# Patient Record
Sex: Male | Born: 1935 | Race: White | Hispanic: No | Marital: Married | Smoking: Former smoker
Health system: Southern US, Community
[De-identification: ages and names within clinical notes are randomized; demographics above are authoritative.]

## PROBLEM LIST (undated history)

## (undated) DIAGNOSIS — M1990 Unspecified osteoarthritis, unspecified site: Secondary | ICD-10-CM

## (undated) DIAGNOSIS — I519 Heart disease, unspecified: Secondary | ICD-10-CM

## (undated) DIAGNOSIS — H409 Unspecified glaucoma: Secondary | ICD-10-CM

## (undated) DIAGNOSIS — M109 Gout, unspecified: Secondary | ICD-10-CM

## (undated) DIAGNOSIS — I4891 Unspecified atrial fibrillation: Secondary | ICD-10-CM

## (undated) DIAGNOSIS — K219 Gastro-esophageal reflux disease without esophagitis: Secondary | ICD-10-CM

## (undated) DIAGNOSIS — E785 Hyperlipidemia, unspecified: Secondary | ICD-10-CM

## (undated) HISTORY — DX: Unspecified atrial fibrillation: I48.91

## (undated) HISTORY — DX: Gastro-esophageal reflux disease without esophagitis: K21.9

## (undated) HISTORY — PX: GLAUCOMA SURGERY: SHX656

## (undated) HISTORY — DX: Gout, unspecified: M10.9

## (undated) HISTORY — DX: Unspecified glaucoma: H40.9

## (undated) HISTORY — DX: Unspecified osteoarthritis, unspecified site: M19.90

## (undated) HISTORY — PX: COLONOSCOPY: SHX5424

## (undated) HISTORY — DX: Hyperlipidemia, unspecified: E78.5

## (undated) HISTORY — DX: Heart disease, unspecified: I51.9

---

## 2004-11-16 ENCOUNTER — Ambulatory Visit: Payer: Self-pay | Admitting: General Practice

## 2005-01-04 ENCOUNTER — Ambulatory Visit: Payer: Self-pay | Admitting: General Practice

## 2005-01-04 ENCOUNTER — Other Ambulatory Visit: Payer: Self-pay

## 2005-01-11 ENCOUNTER — Ambulatory Visit: Payer: Self-pay | Admitting: General Practice

## 2007-05-24 ENCOUNTER — Ambulatory Visit: Payer: Self-pay | Admitting: Family Medicine

## 2007-06-11 ENCOUNTER — Ambulatory Visit: Payer: Self-pay | Admitting: Surgery

## 2007-06-12 ENCOUNTER — Ambulatory Visit: Payer: Self-pay | Admitting: Surgery

## 2011-10-03 ENCOUNTER — Ambulatory Visit: Payer: Self-pay | Admitting: Family Medicine

## 2011-10-04 ENCOUNTER — Ambulatory Visit: Payer: Self-pay | Admitting: Family Medicine

## 2014-07-29 DIAGNOSIS — Z7901 Long term (current) use of anticoagulants: Secondary | ICD-10-CM | POA: Insufficient documentation

## 2014-07-29 DIAGNOSIS — M109 Gout, unspecified: Secondary | ICD-10-CM | POA: Insufficient documentation

## 2014-07-29 DIAGNOSIS — I4891 Unspecified atrial fibrillation: Secondary | ICD-10-CM | POA: Insufficient documentation

## 2014-07-29 DIAGNOSIS — K219 Gastro-esophageal reflux disease without esophagitis: Secondary | ICD-10-CM | POA: Insufficient documentation

## 2014-07-29 DIAGNOSIS — K5732 Diverticulitis of large intestine without perforation or abscess without bleeding: Secondary | ICD-10-CM | POA: Insufficient documentation

## 2014-07-29 DIAGNOSIS — E7849 Other hyperlipidemia: Secondary | ICD-10-CM | POA: Insufficient documentation

## 2014-07-29 HISTORY — DX: Long term (current) use of anticoagulants: Z79.01

## 2014-08-28 ENCOUNTER — Other Ambulatory Visit: Payer: Medicare Other

## 2014-08-28 DIAGNOSIS — Z7901 Long term (current) use of anticoagulants: Secondary | ICD-10-CM

## 2014-08-29 LAB — PROTIME-INR
INR: 2.4 — AB (ref 0.8–1.2)
Prothrombin Time: 25.3 s — ABNORMAL HIGH (ref 9.1–12.0)

## 2014-09-29 ENCOUNTER — Other Ambulatory Visit: Payer: Medicare Other | Admitting: Family Medicine

## 2014-09-29 DIAGNOSIS — Z7901 Long term (current) use of anticoagulants: Secondary | ICD-10-CM

## 2014-09-30 ENCOUNTER — Telehealth: Payer: Self-pay

## 2014-09-30 LAB — PROTIME-INR
INR: 2.6 — ABNORMAL HIGH (ref 0.8–1.2)
PROTHROMBIN TIME: 26.9 s — AB (ref 9.1–12.0)

## 2014-09-30 NOTE — Telephone Encounter (Signed)
-----   Message from Duanne Limerickeanna C Jones, MD sent at 09/30/2014  7:52 AM EDT ----- Same/4 wks

## 2014-09-30 NOTE — Telephone Encounter (Signed)
Spoke with patient wife, advised of results. [facility]

## 2014-11-03 ENCOUNTER — Other Ambulatory Visit: Payer: Medicare Other

## 2014-11-03 DIAGNOSIS — Z7901 Long term (current) use of anticoagulants: Secondary | ICD-10-CM

## 2014-11-04 LAB — PROTIME-INR
INR: 2.9 — ABNORMAL HIGH (ref 0.8–1.2)
PROTHROMBIN TIME: 29.8 s — AB (ref 9.1–12.0)

## 2014-11-19 ENCOUNTER — Other Ambulatory Visit: Payer: Self-pay

## 2014-11-19 DIAGNOSIS — Z7901 Long term (current) use of anticoagulants: Secondary | ICD-10-CM

## 2014-11-19 MED ORDER — WARFARIN SODIUM 6 MG PO TABS: 6.0000 mg | ORAL_TABLET | Freq: Every day | ORAL | Status: DC

## 2014-11-27 ENCOUNTER — Encounter: Payer: Self-pay | Admitting: Family Medicine

## 2014-11-27 ENCOUNTER — Ambulatory Visit (INDEPENDENT_AMBULATORY_CARE_PROVIDER_SITE_OTHER): Payer: Medicare Other | Admitting: Family Medicine

## 2014-11-27 VITALS — BP 140/80 | HR 60 | Ht 68.0 in | Wt 200.0 lb

## 2014-11-27 DIAGNOSIS — Z7901 Long term (current) use of anticoagulants: Secondary | ICD-10-CM | POA: Diagnosis not present

## 2014-11-27 DIAGNOSIS — Z23 Encounter for immunization: Secondary | ICD-10-CM

## 2014-11-27 DIAGNOSIS — E7849 Other hyperlipidemia: Secondary | ICD-10-CM

## 2014-11-27 DIAGNOSIS — E784 Other hyperlipidemia: Secondary | ICD-10-CM

## 2014-11-27 DIAGNOSIS — K219 Gastro-esophageal reflux disease without esophagitis: Secondary | ICD-10-CM

## 2014-11-27 DIAGNOSIS — I4891 Unspecified atrial fibrillation: Secondary | ICD-10-CM | POA: Diagnosis not present

## 2014-11-27 LAB — HEMOCCULT GUIAC POC 1CARD (OFFICE): FECAL OCCULT BLD: NEGATIVE

## 2014-11-27 MED ORDER — WARFARIN SODIUM 6 MG PO TABS
6.0000 mg | ORAL_TABLET | Freq: Every day | ORAL | Status: DC
Start: 2014-11-27 — End: 2015-01-19

## 2014-11-27 NOTE — Patient Instructions (Signed)

## 2014-11-27 NOTE — Progress Notes (Signed)
Name: Erik Larson   MRN: [id number]    DOB: Mar 11, 1925   Date:11/27/2014       Progress Note  Subjective  Chief Complaint  Chief Complaint  Patient presents with  . Atrial Fibrillation    needs refill on coumadin    Atrial Fibrillation Presents for follow-up visit. Symptoms are negative for an AICD problem, bradycardia, chest pain, dizziness, hemodynamic instability, hypertension, hypotension, pacemaker problem, palpitations, shortness of breath, syncope, tachycardia and weakness. The symptoms have been stable. Past treatments include warfarin. Compliance with prior treatments has been good. Past medical history includes atrial fibrillation and hyperlipidemia. There is no history of atrial flutter, AICD, CABG/stent, CAD, CHF, DVT, HTN and valvular heart disease. There are no medication compliance problems.  Heartburn He complains of heartburn. He reports no abdominal pain, no belching, no chest pain, no choking, no coughing, no dysphagia, no hoarse voice, no nausea, no sore throat or no wheezing. This is a chronic problem. The current episode started more than 1 year ago. The problem occurs frequently. The problem has been unchanged. The heartburn is of mild intensity. The symptoms are aggravated by certain foods. Pertinent negatives include no anemia, fatigue, melena, muscle weakness, orthopnea or weight loss. He has tried an antacid for the symptoms. The treatment provided mild relief.  Hyperlipidemia This is a chronic problem. The current episode started more than 1 year ago. The problem is uncontrolled. Recent lipid tests were reviewed and are variable. He has no history of chronic renal disease, diabetes, hypothyroidism, liver disease, obesity or nephrotic syndrome. There are no known factors aggravating his hyperlipidemia. Pertinent negatives include no chest pain, focal sensory loss, focal weakness, leg pain, myalgias or shortness of breath. Current antihyperlipidemic treatment includes  diet change. The current treatment provides mild improvement of lipids. There are no compliance problems.  Risk factors for coronary artery disease include dyslipidemia, male sex and obesity.    No problem-specific assessment & plan notes found for this encounter.   Past Medical History  Diagnosis Date  . Atrial fibrillation     controlled on coumadin  . GERD (gastroesophageal reflux disease)   . Hyperlipidemia     Past Surgical History  Procedure Laterality Date  . Glaucoma surgery    . Colonoscopy  2005 ?    Family History  Problem Relation Age of Onset  . Cancer Maternal Uncle     Social History   Social History  . Marital Status: Married    Spouse Name: N/A  . Number of Children: N/A  . Years of Education: N/A   Occupational History  . Not on file.   Social History Main Topics  . Smoking status: Never Smoker   . Smokeless tobacco: Not on file  . Alcohol Use: 0.0 oz/week    0 Standard drinks or equivalent per week  . Drug Use: No  . Sexual Activity: Yes   Other Topics Concern  . Not on file   Social History Narrative    Allergies  Allergen Reactions  . Ace Inhibitors   . Penicillins      Review of Systems  Constitutional: Negative for fever, chills, weight loss, malaise/fatigue and fatigue.  HENT: Negative for ear discharge, ear pain, hoarse voice and sore throat.   Eyes: Negative for blurred vision.  Respiratory: Negative for cough, sputum production, choking, shortness of breath and wheezing.   Cardiovascular: Negative for chest pain, palpitations, leg swelling and syncope.  Gastrointestinal: Positive for heartburn. Negative for dysphagia, nausea, abdominal pain,  diarrhea, constipation, blood in stool and melena.  Genitourinary: Negative for dysuria, urgency, frequency and hematuria.  Musculoskeletal: Negative for myalgias, back pain, joint pain, muscle weakness and neck pain.  Skin: Negative for rash.  Neurological: Negative for dizziness,  tingling, sensory change, focal weakness, weakness and headaches.  Endo/Heme/Allergies: Negative for environmental allergies and polydipsia. Does not bruise/bleed easily.  Psychiatric/Behavioral: Negative for depression and suicidal ideas. The patient is not nervous/anxious and does not have insomnia.      Objective  Filed Vitals:   11/27/14 0807  BP: 140/80  Pulse: 60  Height:  (1.727 m)  Weight: 200 lb (90.719 kg)    Physical Exam  Constitutional: He is oriented to person, place, and time and well-developed, well-nourished, and in no distress.  HENT:  Head: Normocephalic.  Right Ear: External ear normal.  Left Ear: External ear normal.  Nose: Nose normal.  Mouth/Throat: Oropharynx is clear and moist.  Eyes: Conjunctivae and EOM are normal. Pupils are equal, round, and reactive to light. Right eye exhibits no discharge. Left eye exhibits no discharge. No scleral icterus.  Neck: Normal range of motion. Neck supple. No JVD present. No tracheal deviation present. No thyromegaly present.  Cardiovascular: Normal rate, regular rhythm, normal heart sounds and intact distal pulses.  Exam reveals no gallop and no friction rub.   No murmur heard. Pulmonary/Chest: Breath sounds normal. No respiratory distress. He has no wheezes. He has no rales.  Abdominal: Soft. Bowel sounds are normal. He exhibits no mass. There is no hepatosplenomegaly. There is no tenderness. There is no rebound, no guarding and no CVA tenderness.  Genitourinary: Rectum normal and prostate normal.  Musculoskeletal: Normal range of motion. He exhibits no edema or tenderness.  Lymphadenopathy:    He has no cervical adenopathy.  Neurological: He is alert and oriented to person, place, and time. He has normal sensation, normal strength, normal reflexes and intact cranial nerves. No cranial nerve deficit.  Skin: Skin is warm. No rash noted.  Psychiatric: Mood and affect normal.      Assessment & Plan  Problem  List Items Addressed This Visit      Cardiovascular and Mediastinum   Atrial fibrillation, controlled - Primary   Relevant Medications   warfarin (COUMADIN) 6 MG tablet   Other Relevant Orders   INR/PT   Renal Function Panel     Digestive   Acid reflux     Other   Familial multiple lipoprotein-type hyperlipidemia   Relevant Medications   warfarin (COUMADIN) 6 MG tablet   Other Relevant Orders   Lipid Profile    Other Visit Diagnoses    Chronic anticoagulation        Relevant Medications    warfarin (COUMADIN) 6 MG tablet    Other Relevant Orders    POCT Occult Blood Stool (Completed)    Need for influenza vaccination        Relevant Orders    Flu Vaccine QUAD 36+ mos PF IM (Fluarix & Fluzone Quad PF) (Completed)         Dr. [facility]  11/27/2014

## 2014-11-28 LAB — RENAL FUNCTION PANEL
Albumin: 4.3 g/dL (ref 3.5–4.7)
BUN / CREAT RATIO: 18 (ref 10–22)
BUN: 19 mg/dL (ref 8–27)
CALCIUM: 8.9 mg/dL (ref 8.6–10.2)
CHLORIDE: 103 mmol/L (ref 97–108)
CO2: 25 mmol/L (ref 18–29)
Creatinine, Ser: 1.08 mg/dL (ref 0.76–1.27)
GFR calc Af Amer: 71 mL/min/{1.73_m2} (ref >59)
GFR calc non Af Amer: 61 mL/min/{1.73_m2} (ref >59)
GLUCOSE: 87 mg/dL (ref 65–99)
PHOSPHORUS: 3.1 mg/dL (ref 2.5–4.5)
POTASSIUM: 5.1 mmol/L (ref 3.5–5.2)
SODIUM: 144 mmol/L (ref 134–144)

## 2014-11-28 LAB — LIPID PANEL
CHOLESTEROL TOTAL: 168 mg/dL (ref 100–199)
Chol/HDL Ratio: 5.1 ratio units — ABNORMAL HIGH (ref 0.0–5.0)
HDL: 33 mg/dL — ABNORMAL LOW (ref >39)
LDL CALC: 76 mg/dL (ref 0–99)
Triglycerides: 294 mg/dL — ABNORMAL HIGH (ref 0–149)
VLDL Cholesterol Cal: 59 mg/dL — ABNORMAL HIGH (ref 5–40)

## 2014-11-28 LAB — PROTIME-INR
INR: 1.7 — AB (ref 0.8–1.2)
PROTHROMBIN TIME: 17.6 s — AB (ref 9.1–12.0)

## 2014-12-16 ENCOUNTER — Other Ambulatory Visit: Payer: Medicare Other

## 2014-12-16 DIAGNOSIS — I4891 Unspecified atrial fibrillation: Secondary | ICD-10-CM

## 2014-12-17 LAB — PROTIME-INR
INR: 2.5 — AB (ref 0.8–1.2)
PROTHROMBIN TIME: 25.7 s — AB (ref 9.1–12.0)

## 2015-01-14 ENCOUNTER — Other Ambulatory Visit: Payer: Medicare Other

## 2015-01-14 DIAGNOSIS — Z7901 Long term (current) use of anticoagulants: Secondary | ICD-10-CM

## 2015-01-15 LAB — PROTIME-INR
INR: 2.8 — ABNORMAL HIGH (ref 0.8–1.2)
Prothrombin Time: 28.4 s — ABNORMAL HIGH (ref 9.1–12.0)

## 2015-01-19 ENCOUNTER — Other Ambulatory Visit: Payer: Self-pay | Admitting: Family Medicine

## 2015-02-17 ENCOUNTER — Other Ambulatory Visit: Payer: Medicare Other

## 2015-02-17 DIAGNOSIS — Z7901 Long term (current) use of anticoagulants: Secondary | ICD-10-CM

## 2015-02-18 LAB — PROTIME-INR
INR: 2.7 — ABNORMAL HIGH (ref 0.8–1.2)
PROTHROMBIN TIME: 27.5 s — AB (ref 9.1–12.0)

## 2015-02-18 MED ORDER — WARFARIN SODIUM 6 MG PO TABS: 6.0000 mg | ORAL_TABLET | Freq: Every day | ORAL | Status: DC

## 2015-02-18 NOTE — Addendum Note (Signed)
Addended by: Everitt AmberLYNCH, TARA L on: 02/18/2015 03:35 PM   Modules accepted: Orders

## 2015-03-18 ENCOUNTER — Other Ambulatory Visit: Payer: Medicare Other

## 2015-03-18 DIAGNOSIS — Z7901 Long term (current) use of anticoagulants: Secondary | ICD-10-CM

## 2015-03-19 LAB — PROTIME-INR
INR: 3.2 — ABNORMAL HIGH (ref 0.8–1.2)
PROTHROMBIN TIME: 32.4 s — AB (ref 9.1–12.0)

## 2015-04-02 ENCOUNTER — Other Ambulatory Visit: Payer: Self-pay

## 2015-04-02 DIAGNOSIS — M109 Gout, unspecified: Secondary | ICD-10-CM

## 2015-04-02 MED ORDER — INDOMETHACIN 50 MG PO CAPS: 50.0000 mg | ORAL_CAPSULE | Freq: Two times a day (BID) | ORAL | Status: DC

## 2015-04-06 ENCOUNTER — Other Ambulatory Visit: Payer: Medicare Other

## 2015-04-06 DIAGNOSIS — Z7901 Long term (current) use of anticoagulants: Secondary | ICD-10-CM

## 2015-04-07 LAB — PROTIME-INR
INR: 2.6 — AB (ref 0.8–1.2)
Prothrombin Time: 26.2 s — ABNORMAL HIGH (ref 9.1–12.0)

## 2015-04-13 ENCOUNTER — Other Ambulatory Visit: Payer: Self-pay

## 2015-04-13 DIAGNOSIS — M1A4710 Other secondary chronic gout, right ankle and foot, without tophus (tophi): Secondary | ICD-10-CM

## 2015-04-13 MED ORDER — ALLOPURINOL 100 MG PO TABS: 100.0000 mg | ORAL_TABLET | Freq: Every day | ORAL | Status: DC

## 2015-04-22 ENCOUNTER — Other Ambulatory Visit: Payer: Self-pay

## 2015-04-22 DIAGNOSIS — M109 Gout, unspecified: Secondary | ICD-10-CM

## 2015-04-22 MED ORDER — INDOMETHACIN 50 MG PO CAPS: 50.0000 mg | ORAL_CAPSULE | Freq: Two times a day (BID) | ORAL | Status: DC

## 2015-04-23 ENCOUNTER — Other Ambulatory Visit: Payer: Self-pay | Admitting: Family Medicine

## 2015-05-04 ENCOUNTER — Other Ambulatory Visit: Payer: Medicare Other

## 2015-05-08 ENCOUNTER — Other Ambulatory Visit: Payer: Medicare Other

## 2015-05-08 DIAGNOSIS — Z7901 Long term (current) use of anticoagulants: Secondary | ICD-10-CM

## 2015-05-09 LAB — PROTIME-INR
INR: 2.1 — ABNORMAL HIGH (ref 0.8–1.2)
PROTHROMBIN TIME: 21.6 s — AB (ref 9.1–12.0)

## 2015-06-16 ENCOUNTER — Other Ambulatory Visit: Payer: Medicare Other

## 2015-06-16 ENCOUNTER — Other Ambulatory Visit: Payer: Self-pay

## 2015-06-16 DIAGNOSIS — Z7901 Long term (current) use of anticoagulants: Secondary | ICD-10-CM

## 2015-06-16 DIAGNOSIS — M1A4710 Other secondary chronic gout, right ankle and foot, without tophus (tophi): Secondary | ICD-10-CM

## 2015-06-16 MED ORDER — ALLOPURINOL 100 MG PO TABS: 100.0000 mg | ORAL_TABLET | Freq: Two times a day (BID) | ORAL | Status: DC

## 2015-06-17 LAB — PROTIME-INR
INR: 1.8 — AB (ref 0.8–1.2)
PROTHROMBIN TIME: 18.1 s — AB (ref 9.1–12.0)

## 2015-07-01 ENCOUNTER — Other Ambulatory Visit: Payer: Medicare Other

## 2015-07-01 DIAGNOSIS — Z7901 Long term (current) use of anticoagulants: Secondary | ICD-10-CM

## 2015-07-02 LAB — PROTIME-INR
INR: 1.9 — ABNORMAL HIGH (ref 0.8–1.2)
Prothrombin Time: 19.8 s — ABNORMAL HIGH (ref 9.1–12.0)

## 2015-07-14 ENCOUNTER — Other Ambulatory Visit: Payer: Medicare Other

## 2015-07-14 DIAGNOSIS — Z5181 Encounter for therapeutic drug level monitoring: Secondary | ICD-10-CM

## 2015-07-14 DIAGNOSIS — Z7901 Long term (current) use of anticoagulants: Principal | ICD-10-CM

## 2015-07-15 LAB — PROTIME-INR
INR: 2.1 — AB (ref 0.8–1.2)
PROTHROMBIN TIME: 21.3 s — AB (ref 9.1–12.0)

## 2015-07-20 ENCOUNTER — Other Ambulatory Visit: Payer: Self-pay | Admitting: Family Medicine

## 2015-08-18 ENCOUNTER — Other Ambulatory Visit: Payer: Medicare Other

## 2015-08-18 DIAGNOSIS — Z7901 Long term (current) use of anticoagulants: Secondary | ICD-10-CM

## 2015-08-19 LAB — PROTIME-INR
INR: 2.3 — ABNORMAL HIGH (ref 0.8–1.2)
Prothrombin Time: 23.2 s — ABNORMAL HIGH (ref 9.1–12.0)

## 2015-09-17 ENCOUNTER — Other Ambulatory Visit: Payer: Medicare Other

## 2015-09-17 DIAGNOSIS — Z7901 Long term (current) use of anticoagulants: Secondary | ICD-10-CM

## 2015-09-17 MED ORDER — INDOMETHACIN 50 MG PO CAPS: ORAL_CAPSULE | ORAL | Status: DC

## 2015-09-17 NOTE — Addendum Note (Signed)
Addended by: Everitt AmberLYNCH, Larell Baney L on: 09/17/2015 09:05 AM   Modules accepted: Orders

## 2015-09-18 ENCOUNTER — Other Ambulatory Visit: Payer: Self-pay

## 2015-09-18 LAB — PROTIME-INR
INR: 2.2 — ABNORMAL HIGH (ref 0.8–1.2)
PROTHROMBIN TIME: 22.1 s — AB (ref 9.1–12.0)

## 2015-09-18 MED ORDER — WARFARIN SODIUM 6 MG PO TABS: 6.0000 mg | ORAL_TABLET | Freq: Every day | ORAL | Status: DC

## 2015-10-09 ENCOUNTER — Encounter: Payer: Self-pay | Admitting: Family Medicine

## 2015-10-09 ENCOUNTER — Ambulatory Visit (INDEPENDENT_AMBULATORY_CARE_PROVIDER_SITE_OTHER): Payer: Medicare Other | Admitting: Family Medicine

## 2015-10-09 VITALS — BP 130/70 | HR 60 | Ht 68.0 in | Wt 197.0 lb

## 2015-10-09 DIAGNOSIS — M1A4710 Other secondary chronic gout, right ankle and foot, without tophus (tophi): Secondary | ICD-10-CM | POA: Diagnosis not present

## 2015-10-09 DIAGNOSIS — R351 Nocturia: Secondary | ICD-10-CM

## 2015-10-09 DIAGNOSIS — Z7901 Long term (current) use of anticoagulants: Secondary | ICD-10-CM | POA: Diagnosis not present

## 2015-10-09 DIAGNOSIS — N401 Enlarged prostate with lower urinary tract symptoms: Secondary | ICD-10-CM

## 2015-10-09 DIAGNOSIS — E790 Hyperuricemia without signs of inflammatory arthritis and tophaceous disease: Secondary | ICD-10-CM | POA: Diagnosis not present

## 2015-10-09 MED ORDER — ALLOPURINOL 100 MG PO TABS: 100.0000 mg | ORAL_TABLET | Freq: Two times a day (BID) | ORAL | 1 refills | Status: DC

## 2015-10-09 NOTE — Progress Notes (Signed)
Name: Erik Larson   MRN: [id number]    DOB: [birth year]   Date:10/09/2015       Progress Note  Subjective  Chief Complaint  Chief Complaint  Patient presents with  . Gout    refill Allopurinol    Patient present for evaluation and refill of allopurinol.    No problem-specific Assessment & Plan notes found for this encounter.   Past Medical History:  Diagnosis Date  . Atrial fibrillation (HCC)    controlled on coumadin  . GERD (gastroesophageal reflux disease)   . Hyperlipidemia     Past Surgical History:  Procedure Laterality Date  . COLONOSCOPY  2005 ?  Marland Kitchen GLAUCOMA SURGERY      Family History  Problem Relation Age of Onset  . Cancer Maternal Uncle     Social History   Social History  . Marital status: Married    Spouse name: N/A  . Number of children: N/A  . Years of education: N/A   Occupational History  . Not on file.   Social History Main Topics  . Smoking status: Former Smoker    Types: Cigars  . Smokeless tobacco: Never Used  . Alcohol use 0.0 oz/week  . Drug use: No  . Sexual activity: Yes   Other Topics Concern  . Not on file   Social History Narrative  . No narrative on file    Allergies  Allergen Reactions  . Ace Inhibitors   . Penicillins      Review of Systems  Constitutional: Negative for chills, fever, malaise/fatigue and weight loss.  HENT: Negative for ear discharge, ear pain and sore throat.   Eyes: Negative for blurred vision.  Respiratory: Negative for cough, sputum production, shortness of breath and wheezing.   Cardiovascular: Negative for chest pain, palpitations, claudication and leg swelling.  Gastrointestinal: Negative for abdominal pain, blood in stool, constipation, diarrhea, heartburn, melena and nausea.  Genitourinary: Negative for dysuria, frequency, hematuria and urgency.  Musculoskeletal: Negative for back pain, joint pain, myalgias and neck pain.  Skin: Negative for rash.  Neurological: Negative for  dizziness, tingling, sensory change, focal weakness and headaches.  Endo/Heme/Allergies: Negative for environmental allergies and polydipsia. Does not bruise/bleed easily.  Psychiatric/Behavioral: Negative for depression and suicidal ideas. The patient is not nervous/anxious and does not have insomnia.      Objective  Vitals:   10/09/15 0912  BP: 130/70  Pulse: 60  Weight: 197 lb (89.4 kg)  Height: 5\' 8"  (1.727 m)    Physical Exam  Constitutional: He is oriented to person, place, and time and well-developed, well-nourished, and in no distress.  HENT:  Head: Normocephalic.  Right Ear: External ear normal.  Left Ear: External ear normal.  Nose: Nose normal.  Mouth/Throat: Oropharynx is clear and moist.  Eyes: Conjunctivae and EOM are normal. Pupils are equal, round, and reactive to light. Right eye exhibits no discharge. Left eye exhibits no discharge. No scleral icterus.  Neck: Normal range of motion. Neck supple. No JVD present. No tracheal deviation present. No thyromegaly present.  Cardiovascular: Normal rate, regular rhythm, normal heart sounds and intact distal pulses.  Exam reveals no gallop and no friction rub.   No murmur heard. Pulmonary/Chest: Breath sounds normal. No respiratory distress. He has no wheezes. He has no rales.  Abdominal: Soft. Bowel sounds are normal. He exhibits no mass. There is no hepatosplenomegaly. There is no tenderness. There is no rebound, no guarding and no CVA tenderness.  Genitourinary: Rectum normal and prostate  normal. Rectal exam shows guaiac negative stool.  Musculoskeletal: Normal range of motion. He exhibits no edema or tenderness.  Lymphadenopathy:    He has no cervical adenopathy.  Neurological: He is alert and oriented to person, place, and time. He has normal sensation, normal strength, normal reflexes and intact cranial nerves. No cranial nerve deficit.  Skin: Skin is warm. No rash noted.  Psychiatric: Mood and affect normal.   Nursing note and vitals reviewed.     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 [facility]  10/09/15

## 2015-10-10 LAB — PROTIME-INR
INR: 2 — ABNORMAL HIGH (ref 0.8–1.2)
PROTHROMBIN TIME: 20.6 s — AB (ref 9.1–12.0)

## 2015-10-10 LAB — RENAL FUNCTION PANEL
ALBUMIN: 3.8 g/dL (ref 3.5–4.7)
BUN / CREAT RATIO: 12 (ref 10–24)
BUN: 10 mg/dL (ref 8–27)
CALCIUM: 9 mg/dL (ref 8.6–10.2)
CHLORIDE: 104 mmol/L (ref 96–106)
CO2: 25 mmol/L (ref 18–29)
Creatinine, Ser: 0.82 mg/dL (ref 0.76–1.27)
GFR calc Af Amer: 91 mL/min/{1.73_m2} (ref >59)
GFR calc non Af Amer: 79 mL/min/{1.73_m2} (ref >59)
Glucose: 86 mg/dL (ref 65–99)
PHOSPHORUS: 3.1 mg/dL (ref 2.5–4.5)
Potassium: 4.5 mmol/L (ref 3.5–5.2)
Sodium: 142 mmol/L (ref 134–144)

## 2015-10-10 LAB — PSA: Prostate Specific Ag, Serum: 1.8 ng/mL (ref 0.0–4.0)

## 2015-11-17 ENCOUNTER — Other Ambulatory Visit: Payer: Self-pay | Admitting: Family Medicine

## 2015-11-17 ENCOUNTER — Other Ambulatory Visit: Payer: Medicare Other

## 2015-11-17 DIAGNOSIS — Z7901 Long term (current) use of anticoagulants: Secondary | ICD-10-CM

## 2015-11-17 MED ORDER — WARFARIN SODIUM 6 MG PO TABS: 6.0000 mg | ORAL_TABLET | Freq: Every day | ORAL | 1 refills | Status: DC

## 2015-11-18 LAB — PROTIME-INR
INR: 2.5 — ABNORMAL HIGH (ref 0.8–1.2)
PROTHROMBIN TIME: 24.8 s — AB (ref 9.1–12.0)

## 2015-12-01 ENCOUNTER — Other Ambulatory Visit: Payer: Medicare Other

## 2015-12-01 DIAGNOSIS — Z7901 Long term (current) use of anticoagulants: Secondary | ICD-10-CM

## 2015-12-02 LAB — PROTIME-INR
INR: 2.3 — ABNORMAL HIGH (ref 0.8–1.2)
PROTHROMBIN TIME: 22.7 s — AB (ref 9.1–12.0)

## 2016-01-05 ENCOUNTER — Other Ambulatory Visit: Payer: Medicare Other

## 2016-01-05 DIAGNOSIS — M1A4710 Other secondary chronic gout, right ankle and foot, without tophus (tophi): Secondary | ICD-10-CM

## 2016-01-05 DIAGNOSIS — Z7901 Long term (current) use of anticoagulants: Secondary | ICD-10-CM

## 2016-01-05 MED ORDER — ALLOPURINOL 100 MG PO TABS: 100.0000 mg | ORAL_TABLET | Freq: Two times a day (BID) | ORAL | 0 refills | Status: DC

## 2016-01-05 MED ORDER — WARFARIN SODIUM 6 MG PO TABS: 6.0000 mg | ORAL_TABLET | Freq: Every day | ORAL | 1 refills | Status: DC

## 2016-01-05 NOTE — Addendum Note (Signed)
Addended by: Everitt AmberLYNCH, Zoila Ditullio L on: 01/05/2016 11:34 AM   Modules accepted: Orders

## 2016-01-06 LAB — PROTIME-INR
INR: 2.2 — ABNORMAL HIGH (ref 0.8–1.2)
Prothrombin Time: 22.2 s — ABNORMAL HIGH (ref 9.1–12.0)

## 2016-01-07 ENCOUNTER — Other Ambulatory Visit: Payer: Self-pay

## 2016-01-07 MED ORDER — WARFARIN SODIUM 6 MG PO TABS
6.0000 mg | ORAL_TABLET | Freq: Every day | ORAL | 1 refills | Status: DC
Start: 2016-01-07 — End: 2016-05-04

## 2016-02-09 ENCOUNTER — Other Ambulatory Visit: Payer: Medicare Other

## 2016-02-09 DIAGNOSIS — Z7901 Long term (current) use of anticoagulants: Secondary | ICD-10-CM

## 2016-02-10 LAB — PROTIME-INR
INR: 3.4 — AB (ref 0.8–1.2)
PROTHROMBIN TIME: 33.2 s — AB (ref 9.1–12.0)

## 2016-02-10 MED ORDER — WARFARIN SODIUM 5 MG PO TABS: 5.0000 mg | ORAL_TABLET | Freq: Every day | ORAL | 0 refills | Status: DC

## 2016-03-01 ENCOUNTER — Other Ambulatory Visit: Payer: Medicare Other

## 2016-03-01 DIAGNOSIS — Z7901 Long term (current) use of anticoagulants: Secondary | ICD-10-CM

## 2016-03-02 LAB — PROTIME-INR
INR: 2.1 — AB (ref 0.8–1.2)
PROTHROMBIN TIME: 21.4 s — AB (ref 9.1–12.0)

## 2016-04-05 ENCOUNTER — Other Ambulatory Visit: Payer: Medicare Other

## 2016-04-05 DIAGNOSIS — Z7901 Long term (current) use of anticoagulants: Secondary | ICD-10-CM

## 2016-04-05 DIAGNOSIS — R69 Illness, unspecified: Secondary | ICD-10-CM

## 2016-04-06 LAB — RENAL FUNCTION PANEL
Albumin: 4.1 g/dL (ref 3.5–4.7)
BUN/Creatinine Ratio: 17 (ref 10–24)
BUN: 15 mg/dL (ref 8–27)
CO2: 25 mmol/L (ref 18–29)
Calcium: 8.7 mg/dL (ref 8.6–10.2)
Chloride: 105 mmol/L (ref 96–106)
Creatinine, Ser: 0.9 mg/dL (ref 0.76–1.27)
GFR calc Af Amer: 87 mL/min/{1.73_m2} (ref >59)
GFR calc non Af Amer: 75 mL/min/{1.73_m2} (ref >59)
Glucose: 135 mg/dL — ABNORMAL HIGH (ref 65–99)
Phosphorus: 3.3 mg/dL (ref 2.5–4.5)
Potassium: 4 mmol/L (ref 3.5–5.2)
Sodium: 144 mmol/L (ref 134–144)

## 2016-04-06 LAB — LIPID PANEL
Chol/HDL Ratio: 5 ratio units (ref 0.0–5.0)
Cholesterol, Total: 164 mg/dL (ref 100–199)
HDL: 33 mg/dL — ABNORMAL LOW (ref >39)
LDL Calculated: 75 mg/dL (ref 0–99)
Triglycerides: 278 mg/dL — ABNORMAL HIGH (ref 0–149)
VLDL Cholesterol Cal: 56 mg/dL — ABNORMAL HIGH (ref 5–40)

## 2016-04-06 LAB — PROTIME-INR
INR: 1.5 — AB (ref 0.8–1.2)
PROTHROMBIN TIME: 15.9 s — AB (ref 9.1–12.0)

## 2016-04-26 ENCOUNTER — Other Ambulatory Visit: Payer: Medicare Other

## 2016-04-26 DIAGNOSIS — I4891 Unspecified atrial fibrillation: Secondary | ICD-10-CM

## 2016-04-26 DIAGNOSIS — I10 Essential (primary) hypertension: Secondary | ICD-10-CM

## 2016-04-27 LAB — RENAL FUNCTION PANEL
Albumin: 4.1 g/dL (ref 3.5–4.7)
BUN/Creatinine Ratio: 16 (ref 10–24)
BUN: 13 mg/dL (ref 8–27)
CALCIUM: 8.7 mg/dL (ref 8.6–10.2)
CO2: 27 mmol/L (ref 18–29)
CREATININE: 0.83 mg/dL (ref 0.76–1.27)
Chloride: 102 mmol/L (ref 96–106)
GFR calc Af Amer: 90 mL/min/{1.73_m2} (ref >59)
GFR calc non Af Amer: 78 mL/min/{1.73_m2} (ref >59)
Glucose: 81 mg/dL (ref 65–99)
PHOSPHORUS: 3.2 mg/dL (ref 2.5–4.5)
Potassium: 4.1 mmol/L (ref 3.5–5.2)
SODIUM: 144 mmol/L (ref 134–144)

## 2016-04-27 LAB — PROTIME-INR
INR: 2.3 — ABNORMAL HIGH (ref 0.8–1.2)
PROTHROMBIN TIME: 23.2 s — AB (ref 9.1–12.0)

## 2016-05-04 ENCOUNTER — Encounter: Payer: Self-pay | Admitting: Family Medicine

## 2016-05-04 ENCOUNTER — Ambulatory Visit (INDEPENDENT_AMBULATORY_CARE_PROVIDER_SITE_OTHER): Payer: Medicare Other | Admitting: Family Medicine

## 2016-05-04 VITALS — BP 120/80 | HR 80 | Ht 68.0 in | Wt 189.0 lb

## 2016-05-04 DIAGNOSIS — I4891 Unspecified atrial fibrillation: Secondary | ICD-10-CM

## 2016-05-04 MED ORDER — WARFARIN SODIUM 6 MG PO TABS: 6.0000 mg | ORAL_TABLET | Freq: Every day | ORAL | 1 refills | Status: DC

## 2016-05-04 NOTE — Progress Notes (Signed)
Name: Erik Larson   MRN: [id number]    DOB: 28-Oct-1924   Date:05/04/2016       Progress Note  Subjective  Chief Complaint  Chief Complaint  Patient presents with  . Gout  . Atrial Fibrillation    takes Warfarin for prevention    Atrial Fibrillation  Presents for follow-up visit. Symptoms are negative for bradycardia, chest pain, dizziness, hemodynamic instability, hypertension, hypotension, palpitations, shortness of breath, syncope, tachycardia and weakness. The symptoms have been stable. Past medical history includes atrial fibrillation.    No problem-specific Assessment & Plan notes found for this encounter.   Past Medical History:  Diagnosis Date  . Atrial fibrillation (HCC)    controlled on coumadin  . GERD (gastroesophageal reflux disease)   . Hyperlipidemia     Past Surgical History:  Procedure Laterality Date  . COLONOSCOPY  2005 ?  Marland Kitchen GLAUCOMA SURGERY      Family History  Problem Relation Age of Onset  . Cancer Maternal Uncle     Social History   Social History  . Marital status: Married    Spouse name: N/A  . Number of children: N/A  . Years of education: N/A   Occupational History  . Not on file.   Social History Main Topics  . Smoking status: Former Smoker    Types: Cigars  . Smokeless tobacco: Never Used  . Alcohol use 0.0 oz/week  . Drug use: No  . Sexual activity: Yes   Other Topics Concern  . Not on file   Social History Narrative  . No narrative on file    Allergies  Allergen Reactions  . Ace Inhibitors   . Penicillins     Outpatient Medications Prior to Visit  Medication Sig Dispense Refill  . allopurinol (ZYLOPRIM) 100 MG tablet Take 1 tablet (100 mg total) by mouth 2 (two) times daily. 180 tablet 0  . dorzolamide-timolol (COSOPT) 22.3-6.8 MG/ML ophthalmic solution Apply 1 drop to eye as needed. Dr Beverely Pace    . latanoprost (XALATAN) 0.005 % ophthalmic solution Apply 1 drop to eye as needed. Dr Beverely Pace    . warfarin (COUMADIN)  5 MG tablet Take 1 tablet (5 mg total) by mouth daily. 90 tablet 0  . warfarin (COUMADIN) 6 MG tablet Take 1 tablet (6 mg total) by mouth daily. 90 tablet 1  . indomethacin (INDOCIN) 50 MG capsule TAKE 1 CAPSULE BY MOUTH TWICE DAILY WITH A MEAL (Patient not taking: Reported on 05/04/2016) 30 capsule 0   No facility-administered medications prior to visit.     Review of Systems  Constitutional: Negative for chills, fever, malaise/fatigue and weight loss.  HENT: Negative for ear discharge, ear pain and sore throat.   Eyes: Negative for blurred vision.  Respiratory: Negative for cough, sputum production, shortness of breath and wheezing.   Cardiovascular: Negative for chest pain, palpitations, leg swelling and syncope.  Gastrointestinal: Negative for abdominal pain, blood in stool, constipation, diarrhea, heartburn, melena and nausea.  Genitourinary: Negative for dysuria, frequency, hematuria and urgency.  Musculoskeletal: Negative for back pain, joint pain, myalgias and neck pain.  Skin: Negative for rash.  Neurological: Negative for dizziness, tingling, sensory change, focal weakness, weakness and headaches.  Endo/Heme/Allergies: Negative for environmental allergies and polydipsia. Does not bruise/bleed easily.  Psychiatric/Behavioral: Negative for depression and suicidal ideas. The patient is not nervous/anxious and does not have insomnia.      Objective  Vitals:   05/04/16 0826  BP: 120/80  Pulse: 80  Weight: 189  lb (85.7 kg)  Height: 5\' 8"  (1.727 m)    Physical Exam  Constitutional: He is oriented to person, place, and time and well-developed, well-nourished, and in no distress.  HENT:  Head: Normocephalic.  Right Ear: External ear normal.  Left Ear: External ear normal.  Nose: Nose normal.  Mouth/Throat: Oropharynx is clear and moist.  Eyes: Conjunctivae and EOM are normal. Pupils are equal, round, and reactive to light. Right eye exhibits no discharge. Left eye exhibits no  discharge. No scleral icterus.  Neck: Normal range of motion. Neck supple. No JVD present. No tracheal deviation present. No thyromegaly present.  Cardiovascular: Normal rate, regular rhythm, normal heart sounds and intact distal pulses.  Exam reveals no gallop and no friction rub.   No murmur heard. Pulmonary/Chest: Breath sounds normal. No respiratory distress. He has no wheezes. He has no rales.  Abdominal: Soft. Bowel sounds are normal. He exhibits no mass. There is no hepatosplenomegaly. There is no tenderness. There is no rebound, no guarding and no CVA tenderness.  Musculoskeletal: Normal range of motion. He exhibits no edema or tenderness.  Lymphadenopathy:    He has no cervical adenopathy.  Neurological: He is alert and oriented to person, place, and time. He has normal sensation, normal strength and intact cranial nerves. No cranial nerve deficit.  Skin: Skin is warm. No rash noted.  Psychiatric: Mood and affect normal.  Nursing note and vitals reviewed.     Assessment & Plan  Problem List Items Addressed This Visit      Cardiovascular and Mediastinum   Atrial fibrillation, controlled (HCC) - Primary   Relevant Medications   warfarin (COUMADIN) 6 MG tablet   Other Relevant Orders   Protime-INR      Meds ordered this encounter  Medications  . warfarin (COUMADIN) 6 MG tablet    Sig: Take 1 tablet (6 mg total) by mouth daily.    Dispense:  90 tablet    Refill:  1      [facility]  05/04/16

## 2016-05-05 LAB — PROTIME-INR
INR: 2.5 — ABNORMAL HIGH (ref 0.8–1.2)
Prothrombin Time: 24.8 s — ABNORMAL HIGH (ref 9.1–12.0)

## 2016-05-11 ENCOUNTER — Ambulatory Visit: Payer: Self-pay | Admitting: Family Medicine

## 2016-06-01 ENCOUNTER — Other Ambulatory Visit: Payer: Medicare Other

## 2016-06-01 DIAGNOSIS — Z7901 Long term (current) use of anticoagulants: Secondary | ICD-10-CM

## 2016-06-02 LAB — PROTIME-INR
INR: 2.3 — ABNORMAL HIGH (ref 0.8–1.2)
PROTHROMBIN TIME: 23.3 s — AB (ref 9.1–12.0)

## 2016-07-05 ENCOUNTER — Other Ambulatory Visit: Payer: Medicare Other

## 2016-07-05 DIAGNOSIS — Z7901 Long term (current) use of anticoagulants: Secondary | ICD-10-CM

## 2016-07-06 ENCOUNTER — Other Ambulatory Visit: Payer: Self-pay

## 2016-07-06 DIAGNOSIS — M1A4710 Other secondary chronic gout, right ankle and foot, without tophus (tophi): Secondary | ICD-10-CM

## 2016-07-06 LAB — PROTIME-INR
INR: 2.5 — ABNORMAL HIGH (ref 0.8–1.2)
Prothrombin Time: 24.5 s — ABNORMAL HIGH (ref 9.1–12.0)

## 2016-07-06 MED ORDER — ALLOPURINOL 100 MG PO TABS: 100.0000 mg | ORAL_TABLET | Freq: Two times a day (BID) | ORAL | 0 refills | Status: DC

## 2016-07-07 ENCOUNTER — Other Ambulatory Visit: Payer: Self-pay

## 2016-08-09 ENCOUNTER — Other Ambulatory Visit: Payer: Medicare Other

## 2016-08-09 DIAGNOSIS — Z7901 Long term (current) use of anticoagulants: Secondary | ICD-10-CM

## 2016-08-10 ENCOUNTER — Other Ambulatory Visit: Payer: Self-pay

## 2016-08-10 LAB — PROTIME-INR
INR: 2.9 — AB (ref 0.8–1.2)
PROTHROMBIN TIME: 29 s — AB (ref 9.1–12.0)

## 2016-08-11 ENCOUNTER — Other Ambulatory Visit: Payer: Self-pay

## 2016-09-15 ENCOUNTER — Other Ambulatory Visit: Payer: Medicare Other

## 2016-09-15 DIAGNOSIS — M1A4710 Other secondary chronic gout, right ankle and foot, without tophus (tophi): Secondary | ICD-10-CM

## 2016-09-15 DIAGNOSIS — I4891 Unspecified atrial fibrillation: Secondary | ICD-10-CM

## 2016-09-15 MED ORDER — ALLOPURINOL 100 MG PO TABS: 100.0000 mg | ORAL_TABLET | Freq: Two times a day (BID) | ORAL | 0 refills | Status: DC

## 2016-09-15 NOTE — Addendum Note (Signed)
Addended by: Everitt AmberLYNCH, Jasani Dolney L on: 09/15/2016 08:53 AM   Modules accepted: Orders

## 2016-09-16 LAB — PROTIME-INR
INR: 2.2 — AB (ref 0.8–1.2)
PROTHROMBIN TIME: 22.5 s — AB (ref 9.1–12.0)

## 2016-10-17 ENCOUNTER — Other Ambulatory Visit: Payer: Self-pay | Admitting: Family Medicine

## 2016-10-17 DIAGNOSIS — I4891 Unspecified atrial fibrillation: Secondary | ICD-10-CM

## 2016-10-18 ENCOUNTER — Other Ambulatory Visit: Payer: Medicare Other

## 2016-10-18 DIAGNOSIS — Z7901 Long term (current) use of anticoagulants: Secondary | ICD-10-CM

## 2016-10-19 LAB — PROTIME-INR
INR: 2.7 — ABNORMAL HIGH (ref 0.8–1.2)
PROTHROMBIN TIME: 26.6 s — AB (ref 9.1–12.0)

## 2016-11-22 ENCOUNTER — Other Ambulatory Visit: Payer: Medicare Other

## 2016-11-22 DIAGNOSIS — I4891 Unspecified atrial fibrillation: Secondary | ICD-10-CM

## 2016-11-23 LAB — PROTIME-INR
INR: 2.9 — ABNORMAL HIGH (ref 0.8–1.2)
PROTHROMBIN TIME: 28 s — AB (ref 9.1–12.0)

## 2016-12-20 ENCOUNTER — Other Ambulatory Visit: Payer: Medicare Other

## 2016-12-20 DIAGNOSIS — M1A4710 Other secondary chronic gout, right ankle and foot, without tophus (tophi): Secondary | ICD-10-CM

## 2016-12-20 DIAGNOSIS — I4891 Unspecified atrial fibrillation: Secondary | ICD-10-CM

## 2016-12-20 MED ORDER — ALLOPURINOL 100 MG PO TABS: 100.0000 mg | ORAL_TABLET | Freq: Two times a day (BID) | ORAL | 0 refills | Status: DC

## 2016-12-20 NOTE — Addendum Note (Signed)
Addended by: Everitt Amber on: 12/20/2016 08:13 AM   Modules accepted: Orders

## 2016-12-21 LAB — PROTIME-INR
INR: 2.5 — AB (ref 0.8–1.2)
PROTHROMBIN TIME: 24.7 s — AB (ref 9.1–12.0)

## 2016-12-23 ENCOUNTER — Encounter: Payer: Self-pay | Admitting: Family Medicine

## 2016-12-23 ENCOUNTER — Ambulatory Visit (INDEPENDENT_AMBULATORY_CARE_PROVIDER_SITE_OTHER): Payer: Medicare Other | Admitting: Family Medicine

## 2016-12-23 VITALS — BP 130/80 | HR 64 | Ht 68.0 in | Wt 195.0 lb

## 2016-12-23 DIAGNOSIS — N401 Enlarged prostate with lower urinary tract symptoms: Secondary | ICD-10-CM

## 2016-12-23 DIAGNOSIS — Z1211 Encounter for screening for malignant neoplasm of colon: Secondary | ICD-10-CM

## 2016-12-23 DIAGNOSIS — R351 Nocturia: Secondary | ICD-10-CM | POA: Diagnosis not present

## 2016-12-23 DIAGNOSIS — R195 Other fecal abnormalities: Secondary | ICD-10-CM

## 2016-12-23 LAB — HEMOCCULT GUIAC POC 1CARD (OFFICE): FECAL OCCULT BLD: POSITIVE — AB

## 2016-12-23 MED ORDER — TAMSULOSIN HCL 0.4 MG PO CAPS: 0.4000 mg | ORAL_CAPSULE | Freq: Every day | ORAL | 2 refills | Status: DC

## 2016-12-23 NOTE — Patient Instructions (Signed)
Benign Prostatic Hypertrophy The prostate gland is part of the reproductive system of men. A normal prostate is about the size and shape of a walnut. The prostate gland produces a fluid that is mixed with sperm to make semen. This gland surrounds the urethra and is located in front of the rectum and just below the bladder. The bladder is where urine is stored. The urethra is the tube through which urine passes from the bladder to get out of the body. The prostate grows as a man ages. An enlarged prostate not caused by cancer is called benign prostatic hypertrophy (BPH). An enlarged prostate can press on the urethra. This can make it harder to pass urine. In the early stages of enlargement, the bladder can get by with a narrowed urethra by forcing the urine through. If the problem gets worse, medical or surgical treatment may be required. This condition should be followed by your health care provider. The accumulation of urine in the bladder can cause infection. Back pressure and infection can progress to bladder damage and kidney (renal) failure. If needed, your health care provider may refer you to a specialist in kidney and prostate disease (urologist). What are the causes? BPH is a common health problem in men older than 50 years. This condition is a normal part of aging. However, not all men will develop problems from this condition. If the enlargement grows away from the urethra, then there will not be any compression of the urethra and resistance to urine flow.If the growth is toward the urethra and compresses it, you will experience difficulty urinating. What are the signs or symptoms?  Not able to completely empty your bladder.  Getting up often during the night to urinate.  Need to urinate frequently during the day.  Difficultly starting urine flow.  Decrease in size and strength of your urine stream.  Dribbling after urination.  Pain on urination (more common with  infection).  Inability to pass urine. This needs immediate treatment.  The development of a urinary tract infection. How is this diagnosed? These tests will help your health care provider understand your problem:  A thorough history and physical examination.  A urination history, with the number of times you urinate, the amounts of urine, the strength of the urine stream, and the feeling of emptiness or fullness after urinating.  A postvoid bladder scan that measures any amount of urine that may remain in your bladder after you finish urinating.  Digital rectal exam. In a rectal exam, your health care provider checks your prostate by putting a gloved, lubricated finger into your rectum to feel the back of your prostate gland. This exam detects the size of your gland and abnormal lumps or growths.  Exam of your urine (urinalysis).  Prostate specific antigen (PSA) screening. This is a blood test used to screen for prostate cancer.  Rectal ultrasonography. This test uses sound waves to electronically produce a picture of your prostate gland.  How is this treated? Once symptoms begin, your health care provider will monitor your condition. Of the men with this condition, one third will have symptoms that stabilize, one third will have symptoms that improve, and one third will have symptoms that progress in the first year. Mild symptoms may not need treatment. Simple observation and yearly exams may be all that is required. Medicines and surgery are options for more severe problems. Your health care provider can help you make an informed decision for what is best. Two classes of medicines   are available for relief of prostate symptoms:  Medicines that shrink the prostate. This helps relieve symptoms. These medicines take time to work, and it may be months before any improvement is seen. ? Uncommon side effects include problems with sexual function.  Medicines to relax the muscle of the  prostate. This also relieves the obstruction by reducing any compression on the urethra.This group of medicines work much faster than those that reduce the size of the prostate gland. Usually, one can experience improvement in days to weeks.. ? Side effects can include dizziness, fatigue, lightheadedness, and retrograde ejaculation (diminished volume of ejaculate).  Several types of surgical treatments are available for relief of prostate symptoms:  Transurethral resection of the prostate (TURP)-In this treatment, an instrument is inserted through opening at the tip of the penis. It is used to cut away pieces of the inner core of the prostate. The pieces are removed through the same opening of the penis. This removes the obstruction and helps get rid of the symptoms.  Transurethral incision (TUIP)-In this procedure, small cuts are made in the prostate. This lessens the prostates pressure on the urethra.  Transurethral microwave thermotherapy (TUMT)-This procedure uses microwaves to create heat. The heat destroys and removes a small amount of prostate tissue.  Transurethral needle ablation (TUNA)-This is a procedure that uses radio frequencies to do the same as TUMT.  Interstitial laser coagulation (ILC)-This is a procedure that uses a laser to do the same as TUMT and TUNA.  Transurethral electrovaporization (TUVP)-This is a procedure that uses electrodes to do the same as the procedures listed above.  Contact a health care provider if:  You develop a fever.  There is unexplained back pain.  Symptoms are not helped by medicines prescribed.  You develop side effects from the medicine you are taking.  Your urine becomes very dark or has a bad smell.  Your lower abdomen becomes distended and you have difficulty passing your urine. Get help right away if:  You are suddenly unable to urinate. This is an emergency. You should be seen immediately.  There are large amounts of blood or clots  in the urine.  Your urinary problems become unmanageable.  You develop lightheadedness, severe dizziness, or you feel faint.  You develop moderate to severe low back or flank pain.  You develop chills or fever. This information is not intended to replace advice given to you by your health care provider. Make sure you discuss any questions you have with your health care provider. Document Released: 02/28/2005 Document Revised: 08/12/2015 Document Reviewed: 09/13/2012 Elsevier Interactive Patient Education  2017 [employer]  

## 2016-12-23 NOTE — Progress Notes (Signed)
Name: Erik Larson   MRN: [id number]    DOB: 1924/03/20   Date:12/23/2016       Progress Note  Subjective  Chief Complaint  Chief Complaint  Patient presents with  . Benign Prostatic Hypertrophy    hard time getting stream to start and once started it is weak    Benign Prostatic Hypertrophy  This is a new problem. The current episode started more than 1 month ago. The problem has been gradually worsening since onset. Irritative symptoms include frequency, nocturia and urgency. Obstructive symptoms include dribbling, incomplete emptying, an intermittent stream, a slower stream and a weak stream. Pertinent negatives include no chills, dysuria, genital pain, hematuria or nausea. Nothing aggravates the symptoms. The treatment provided mild relief.    No problem-specific Assessment & Plan notes found for this encounter.   Past Medical History:  Diagnosis Date  . Atrial fibrillation (HCC)    controlled on coumadin  . GERD (gastroesophageal reflux disease)   . Hyperlipidemia     Past Surgical History:  Procedure Laterality Date  . COLONOSCOPY  2005 ?  Marland Kitchen GLAUCOMA SURGERY      Family History  Problem Relation Age of Onset  . Cancer Maternal Uncle     Social History   Social History  . Marital status: Married    Spouse name: N/A  . Number of children: N/A  . Years of education: N/A   Occupational History  . Not on file.   Social History Main Topics  . Smoking status: Former Smoker    Types: Cigars  . Smokeless tobacco: Never Used  . Alcohol use 0.0 oz/week  . Drug use: No  . Sexual activity: Yes   Other Topics Concern  . Not on file   Social History Narrative  . No narrative on file    Allergies  Allergen Reactions  . Ace Inhibitors   . Penicillins     Outpatient Medications Prior to Visit  Medication Sig Dispense Refill  . allopurinol (ZYLOPRIM) 100 MG tablet Take 1 tablet (100 mg total) by mouth 2 (two) times daily. 180 tablet 0  .  dorzolamide-timolol (COSOPT) 22.3-6.8 MG/ML ophthalmic solution Apply 1 drop to eye as needed. Dr Beverely Pace    . indomethacin (INDOCIN) 50 MG capsule TAKE 1 CAPSULE BY MOUTH TWICE DAILY WITH A MEAL 30 capsule 0  . latanoprost (XALATAN) 0.005 % ophthalmic solution Apply 1 drop to eye as needed. Dr Beverely Pace    . warfarin (COUMADIN) 5 MG tablet Take 1 tablet (5 mg total) by mouth daily. 90 tablet 0  . warfarin (COUMADIN) 6 MG tablet TAKE 1 TABLET DAILY 90 tablet 0   No facility-administered medications prior to visit.     Review of Systems  Constitutional: Negative for chills, fever, malaise/fatigue and weight loss.  HENT: Negative for ear discharge, ear pain and sore throat.   Eyes: Negative for blurred vision.  Respiratory: Negative for cough, sputum production, shortness of breath and wheezing.   Cardiovascular: Negative for chest pain, palpitations and leg swelling.  Gastrointestinal: Negative for abdominal pain, blood in stool, constipation, diarrhea, heartburn, melena and nausea.  Genitourinary: Positive for frequency, incomplete emptying, nocturia and urgency. Negative for dysuria, flank pain and hematuria.  Musculoskeletal: Negative for back pain, joint pain, myalgias and neck pain.  Skin: Negative for rash.  Neurological: Negative for dizziness, tingling, sensory change, focal weakness and headaches.  Endo/Heme/Allergies: Negative for environmental allergies and polydipsia. Does not bruise/bleed easily.  Psychiatric/Behavioral: Negative for depression and suicidal  ideas. The patient is not nervous/anxious and does not have insomnia.      Objective  Vitals:   12/23/16 1059  BP: 130/80  Pulse: 64  Weight: 195 lb (88.5 kg)  Height:  (1.727 m)    Physical Exam  Constitutional: He is oriented to person, place, and time and well-developed, well-nourished, and in no distress.  HENT:  Head: Normocephalic.  Right Ear: External ear normal.  Left Ear: External ear normal.  Nose:  Nose normal.  Mouth/Throat: Oropharynx is clear and moist.  Eyes: Pupils are equal, round, and reactive to light. Conjunctivae and EOM are normal. Right eye exhibits no discharge. Left eye exhibits no discharge. No scleral icterus.  Neck: Normal range of motion. Neck supple. No JVD present. No tracheal deviation present. No thyromegaly present.  Cardiovascular: Normal rate, regular rhythm, normal heart sounds and intact distal pulses.  Exam reveals no gallop and no friction rub.   No murmur heard. Pulmonary/Chest: Breath sounds normal. No respiratory distress. He has no wheezes. He has no rales.  Abdominal: Soft. Bowel sounds are normal. He exhibits no mass. There is no hepatosplenomegaly. There is no tenderness. There is no rebound, no guarding and no CVA tenderness.  Genitourinary: Prostate normal. Rectal exam shows guaiac positive stool.  Musculoskeletal: Normal range of motion. He exhibits no edema or tenderness.  Lymphadenopathy:    He has no cervical adenopathy.  Neurological: He is alert and oriented to person, place, and time. He has normal sensation, normal strength, normal reflexes and intact cranial nerves. No cranial nerve deficit.  Skin: Skin is warm. No rash noted.  Psychiatric: Mood and affect normal.  Nursing note and vitals reviewed.     Assessment & Plan  Problem List Items Addressed This Visit      Genitourinary   BPH associated with nocturia - Primary   Relevant Medications   tamsulosin (FLOMAX) 0.4 MG CAPS capsule   Other Relevant Orders   Ambulatory referral to Urology   PSA    Other Visit Diagnoses    Guaiac positive stools       Relevant Orders   CBC with Differential/Platelet   POCT occult blood stool (Completed)   Screen for colon cancer          Meds ordered this encounter  Medications  . tamsulosin (FLOMAX) 0.4 MG CAPS capsule    Sig: Take 1 capsule (0.4 mg total) by mouth daily.    Dispense:  30 capsule    Refill:  2      Dr. [facility]  12/23/16

## 2016-12-25 LAB — CBC WITH DIFFERENTIAL/PLATELET
Basophils Absolute: 0.1 10*3/uL (ref 0.0–0.2)
Basos: 1 %
EOS (ABSOLUTE): 0.1 10*3/uL (ref 0.0–0.4)
EOS: 2 %
HEMATOCRIT: 38.3 % (ref 37.5–51.0)
HEMOGLOBIN: 12.9 g/dL — AB (ref 13.0–17.7)
Immature Grans (Abs): 0 10*3/uL (ref 0.0–0.1)
Immature Granulocytes: 0 %
LYMPHS ABS: 1.6 10*3/uL (ref 0.7–3.1)
Lymphs: 24 %
MCH: 32.2 pg (ref 26.6–33.0)
MCHC: 33.7 g/dL (ref 31.5–35.7)
MCV: 96 fL (ref 79–97)
MONOCYTES: 12 %
Monocytes Absolute: 0.8 10*3/uL (ref 0.1–0.9)
NEUTROS ABS: 4 10*3/uL (ref 1.4–7.0)
Neutrophils: 61 %
Platelets: 219 10*3/uL (ref 150–379)
RBC: 4.01 x10E6/uL — ABNORMAL LOW (ref 4.14–5.80)
RDW: 14.3 % (ref 12.3–15.4)
WBC: 6.5 10*3/uL (ref 3.4–10.8)

## 2016-12-25 LAB — PSA: PROSTATE SPECIFIC AG, SERUM: 1.7 ng/mL (ref 0.0–4.0)

## 2017-01-05 ENCOUNTER — Telehealth: Payer: Self-pay

## 2017-01-05 ENCOUNTER — Other Ambulatory Visit: Payer: Self-pay

## 2017-01-05 NOTE — Telephone Encounter (Signed)
Pt's wife called in to say that the Tamsulosin that the pt was started on for prostate is making him dizzy. Pt was told to stop the Tamsulosin and take the bottle with him to see Dr Apolinar JunesBrandon in Nov. Maybe she can come up with a better solution to prostate med. Pt is to call if this doesn't get better after stopping med

## 2017-01-15 ENCOUNTER — Other Ambulatory Visit: Payer: Self-pay | Admitting: Family Medicine

## 2017-01-15 DIAGNOSIS — I4891 Unspecified atrial fibrillation: Secondary | ICD-10-CM

## 2017-01-19 ENCOUNTER — Other Ambulatory Visit: Payer: Self-pay

## 2017-01-19 DIAGNOSIS — N400 Enlarged prostate without lower urinary tract symptoms: Secondary | ICD-10-CM

## 2017-01-20 ENCOUNTER — Other Ambulatory Visit
Admission: RE | Admit: 2017-01-20 | Discharge: 2017-01-20 | Disposition: A | Discharge status: Home / Self Care | Payer: Medicare Other | Source: Ambulatory Visit | Attending: Urology | Admitting: Urology

## 2017-01-20 ENCOUNTER — Ambulatory Visit (INDEPENDENT_AMBULATORY_CARE_PROVIDER_SITE_OTHER): Payer: Medicare Other | Admitting: Urology

## 2017-01-20 ENCOUNTER — Encounter: Payer: Self-pay | Admitting: Urology

## 2017-01-20 ENCOUNTER — Other Ambulatory Visit: Payer: Medicare Other

## 2017-01-20 VITALS — BP 158/93 | HR 83 | Ht 69.0 in | Wt 185.0 lb

## 2017-01-20 DIAGNOSIS — N138 Other obstructive and reflux uropathy: Secondary | ICD-10-CM

## 2017-01-20 DIAGNOSIS — R339 Retention of urine, unspecified: Secondary | ICD-10-CM | POA: Diagnosis not present

## 2017-01-20 DIAGNOSIS — N400 Enlarged prostate without lower urinary tract symptoms: Secondary | ICD-10-CM | POA: Diagnosis not present

## 2017-01-20 DIAGNOSIS — N529 Male erectile dysfunction, unspecified: Secondary | ICD-10-CM

## 2017-01-20 DIAGNOSIS — N401 Enlarged prostate with lower urinary tract symptoms: Secondary | ICD-10-CM

## 2017-01-20 DIAGNOSIS — Z7901 Long term (current) use of anticoagulants: Secondary | ICD-10-CM

## 2017-01-20 LAB — URINALYSIS, COMPLETE (UACMP) WITH MICROSCOPIC
BACTERIA UA: NONE SEEN
Bilirubin Urine: NEGATIVE
GLUCOSE, UA: NEGATIVE mg/dL
Hgb urine dipstick: NEGATIVE
KETONES UR: NEGATIVE mg/dL
LEUKOCYTES UA: NEGATIVE
Nitrite: NEGATIVE
PH: 6 (ref 5.0–8.0)
Protein, ur: NEGATIVE mg/dL
RBC / HPF: NONE SEEN RBC/hpf (ref 0–5)
SPECIFIC GRAVITY, URINE: 1.01 (ref 1.005–1.030)
Squamous Epithelial / LPF: NONE SEEN

## 2017-01-20 LAB — BLADDER SCAN AMB NON-IMAGING

## 2017-01-20 MED ORDER — FINASTERIDE 5 MG PO TABS: 5.0000 mg | ORAL_TABLET | Freq: Every day | ORAL | 11 refills | Status: DC

## 2017-01-20 MED ORDER — FINASTERIDE 5 MG PO TABS: 5.0000 mg | ORAL_TABLET | Freq: Every day | ORAL | 3 refills | Status: DC

## 2017-01-20 NOTE — Progress Notes (Signed)
01/20/2017 5:04 AM   Erik FailFrank H Brager 1924-05-31 [id number]  Referring provider: Duanne LimerickJones, Deanna C, MD [address] [place], [place] [id number]  Chief Complaint  Patient presents with  . Benign Prostatic Hypertrophy    New Patient    HPI:  83 yo M with a personal history of BPH who presents today for further evaluation of his urinary symptoms as well as concern for prostate cancer.  IPSS as below.  He primarily today complains of obstructive voiding symptoms including weak stream, intermittency.  He gets up once at night to void.  He does feel he is able to empty his bladder completely although has an elevated postvoid residual today.  No issues with urinary tract infections, dysuria, gross hematuria, or urinary retention.  He tired flomax but had severe dizziness.  He has since stopped the medication.    PSA 1.7 on 12/23/16.  He is concerned that he had an uncle die of prostate cancer.  He is concerned that he has prostate cancer to would like to be evaluated for this.  PVR 149 cc.  He does note today that he is still sexually active with his wife of 70 years.  He occasionally has difficulty maintaining an erection but otherwise is doing well.  It is quite proud of this.  IPSS    Row Name 01/20/17 1500         International Prostate Symptom Score   How often have you had the sensation of not emptying your bladder?  Less than 1 in 5     How often have you had to urinate less than every two hours?  Less than half the time     How often have you found you stopped and started again several times when you urinated?  More than half the time     How often have you found it difficult to postpone urination?  Not at All     How often have you had a weak urinary stream?  About half the time     How often have you had to strain to start urination?  Less than half the time     How many times did you typically get up at night to urinate?  1 Time     Total IPSS Score  13       Quality of Life due to urinary symptoms   If you were to spend the rest of your life with your urinary condition just the way it is now how would you feel about that?  Mostly Satisfied        Score:  1-7 Mild 8-19 Moderate 20-35 Severe  PMH: Past Medical History:  Diagnosis Date  . Arthritis   . Atrial fibrillation (HCC)    controlled on coumadin  . GERD (gastroesophageal reflux disease)   . Glaucoma   . Gout   . Heart disease   . Hyperlipidemia     Surgical History: Past Surgical History:  Procedure Laterality Date  . COLONOSCOPY  2005 ?  Marland Kitchen. GLAUCOMA SURGERY      Home Medications:  Allergies as of 01/20/2017      Reactions   Ace Inhibitors    Penicillins       Medication List        Accurate as of 01/20/17 11:59 PM. Always use your most recent med list.          allopurinol 100 MG tablet Commonly known as:  ZYLOPRIM Take 1 tablet (  100 mg total) by mouth 2 (two) times daily.   dorzolamide-timolol 22.3-6.8 MG/ML ophthalmic solution Commonly known as:  COSOPT Apply 1 drop to eye as needed. Dr Beverely Paceheek   finasteride 5 MG tablet Commonly known as:  PROSCAR Take 1 tablet (5 mg total) daily by mouth.   indomethacin 50 MG capsule Commonly known as:  INDOCIN TAKE 1 CAPSULE BY MOUTH TWICE DAILY WITH A MEAL   latanoprost 0.005 % ophthalmic solution Commonly known as:  XALATAN Apply 1 drop to eye as needed. Dr Beverely Paceheek   warfarin 5 MG tablet Commonly known as:  COUMADIN Take 1 tablet (5 mg total) by mouth daily.   warfarin 6 MG tablet Commonly known as:  COUMADIN TAKE 1 TABLET DAILY       Allergies:  Allergies  Allergen Reactions  . Ace Inhibitors   . Penicillins     Family History: Family History  Problem Relation Age of Onset  . Cancer Maternal Uncle     Social History:  reports that he has quit smoking. His smoking use included cigars. he has never used smokeless tobacco. He reports that he drinks alcohol. He reports that he does not use  drugs.  ROS: UROLOGY Frequent Urination?: Yes Hard to postpone urination?: No Burning/pain with urination?: No Get up at night to urinate?: Yes Leakage of urine?: No Urine stream starts and stops?: Yes Trouble starting stream?: Yes Do you have to strain to urinate?: No Blood in urine?: No Urinary tract infection?: No Sexually transmitted disease?: No Injury to kidneys or bladder?: No Painful intercourse?: No Weak stream?: Yes Erection problems?: Yes Penile pain?: No  Gastrointestinal Nausea?: No Vomiting?: No Indigestion/heartburn?: No Diarrhea?: No Constipation?: No  Constitutional Fever: No Night sweats?: No Weight loss?: No Fatigue?: Yes  Skin Skin rash/lesions?: No Itching?: No  Eyes Blurred vision?: No Double vision?: No  Ears/Nose/Throat Sore throat?: No Sinus problems?: No  Hematologic/Lymphatic Swollen glands?: No Easy bruising?: No  Cardiovascular Leg swelling?: No Chest pain?: No  Respiratory Cough?: No Shortness of breath?: Yes  Endocrine Excessive thirst?: No  Musculoskeletal Back pain?: Yes Joint pain?: No  Neurological Headaches?: Yes Dizziness?: Yes  Psychologic Depression?: No Anxiety?: No  Physical Exam: BP (!) 158/93   Pulse 83   Ht 5\' 9"  (1.753 m)   Wt 185 lb (83.9 kg)   BMI 27.32 kg/m   Constitutional:  Alert and oriented, No acute distress.  Generally.  Appears slightly younger than stated age. HEENT: [place] AT, moist mucus membranes.  Trachea midline, no masses. Cardiovascular: No clubbing, cyanosis, or edema. Respiratory: Normal respiratory effort, no increased work of breathing. GI: Abdomen is soft, nontender, nondistended, no abdominal masses GU: No CVA tenderness Rectal: Enlarged prostate, 40+ cc, no nodules, nontender.  Normal sphincter tone..  Skin: No rashes, bruises or suspicious lesions.. Neurologic: Grossly intact, no focal deficits, moving all 4 extremities. Psychiatric: Normal mood and  affect.  Laboratory Data: Lab Results  Component Value Date   WBC 6.5 12/23/2016   HGB 12.9 (L) 12/23/2016   HCT 38.3 12/23/2016   MCV 96 12/23/2016   PLT 219 12/23/2016    Lab Results  Component Value Date   CREATININE 0.83 04/26/2016    Lab Results  Component Value Date   PSA1 1.7 12/23/2016   PSA1 1.8 10/09/2015    Urinalysis Results for orders placed or performed during the hospital encounter of 01/20/17  Urinalysis, Complete w Microscopic  Result Value Ref Range   Color, Urine STRAW (A) YELLOW  APPearance CLEAR CLEAR   Specific Gravity, Urine 1.010 1.005 - 1.030   pH 6.0 5.0 - 8.0   Glucose, UA NEGATIVE NEGATIVE mg/dL   Hgb urine dipstick NEGATIVE NEGATIVE   Bilirubin Urine NEGATIVE NEGATIVE   Ketones, ur NEGATIVE NEGATIVE mg/dL   Protein, ur NEGATIVE NEGATIVE mg/dL   Nitrite NEGATIVE NEGATIVE   Leukocytes, UA NEGATIVE NEGATIVE   Squamous Epithelial / LPF NONE SEEN NONE SEEN   WBC, UA 0-5 0 - 5 WBC/hpf   RBC / HPF NONE SEEN 0 - 5 RBC/hpf   Bacteria, UA NONE SEEN NONE SEEN    Assessment & Plan:    1. Benign prostatic hyperplasia with urinary obstruction Stop flomax due to dizziness- discarged med today for patient Start finasteride daily- discussed side effects, aware it will take some time to see benefit - BLADDER SCAN AMB NON-IMAGING  2. Incomplete bladder emptying Mildly elevated PVR No obvious sequella Discussed double voiding Recheck in 3 months Cr stable  3. Erectile dysfunction, unspecified erectile dysfunction type Mild ED, continues to be sexually active Will hold off on meds for now unless develops bother  Return in about 3 months (around 04/22/2017) for IPSS/ PVR.  Vanna Scotland, MD  [facility] [address], [address] [place], [place] [address] [phone]

## 2017-01-21 LAB — PROTIME-INR
INR: 2.4 — ABNORMAL HIGH (ref 0.8–1.2)
Prothrombin Time: 23.6 s — ABNORMAL HIGH (ref 9.1–12.0)

## 2017-02-28 ENCOUNTER — Other Ambulatory Visit: Payer: Medicare Other

## 2017-02-28 DIAGNOSIS — Z7901 Long term (current) use of anticoagulants: Secondary | ICD-10-CM

## 2017-03-01 LAB — PROTIME-INR
INR: 2.5 — ABNORMAL HIGH (ref 0.8–1.2)
Prothrombin Time: 24.4 s — ABNORMAL HIGH (ref 9.1–12.0)

## 2017-04-03 ENCOUNTER — Other Ambulatory Visit: Payer: Self-pay

## 2017-04-03 ENCOUNTER — Other Ambulatory Visit: Payer: Medicare Other

## 2017-04-03 DIAGNOSIS — M1A4710 Other secondary chronic gout, right ankle and foot, without tophus (tophi): Secondary | ICD-10-CM

## 2017-04-03 DIAGNOSIS — I482 Chronic atrial fibrillation, unspecified: Secondary | ICD-10-CM

## 2017-04-03 MED ORDER — ALLOPURINOL 100 MG PO TABS: 100.0000 mg | ORAL_TABLET | Freq: Two times a day (BID) | ORAL | 0 refills | Status: DC

## 2017-04-04 LAB — PROTIME-INR
INR: 2.8 — ABNORMAL HIGH (ref 0.8–1.2)
Prothrombin Time: 27.4 s — ABNORMAL HIGH (ref 9.1–12.0)

## 2017-04-15 ENCOUNTER — Other Ambulatory Visit: Payer: Self-pay | Admitting: Family Medicine

## 2017-04-15 DIAGNOSIS — I4891 Unspecified atrial fibrillation: Secondary | ICD-10-CM

## 2017-04-19 ENCOUNTER — Encounter: Payer: Self-pay | Admitting: Family Medicine

## 2017-04-19 ENCOUNTER — Ambulatory Visit (INDEPENDENT_AMBULATORY_CARE_PROVIDER_SITE_OTHER): Payer: Medicare Other | Admitting: Family Medicine

## 2017-04-19 DIAGNOSIS — I4891 Unspecified atrial fibrillation: Secondary | ICD-10-CM | POA: Diagnosis not present

## 2017-04-19 DIAGNOSIS — M1A4710 Other secondary chronic gout, right ankle and foot, without tophus (tophi): Secondary | ICD-10-CM | POA: Diagnosis not present

## 2017-04-19 MED ORDER — ALLOPURINOL 100 MG PO TABS: 100.0000 mg | ORAL_TABLET | Freq: Two times a day (BID) | ORAL | 3 refills | Status: DC

## 2017-04-19 MED ORDER — WARFARIN SODIUM 6 MG PO TABS: 6.0000 mg | ORAL_TABLET | Freq: Every day | ORAL | 1 refills | Status: DC

## 2017-04-19 NOTE — Progress Notes (Signed)
Name: Erik Larson   MRN: [id number]    DOB: [birth year]   Date:04/19/2017       Progress Note  Subjective  Chief Complaint  Chief Complaint  Patient presents with  . Gout  . Atrial Fibrillation    Patient present to refill medication and review anticoagulation management.   Atrial Fibrillation  Presents for follow-up visit. Symptoms are negative for bradycardia, chest pain, dizziness, hemodynamic instability, hypertension, hypotension, palpitations, shortness of breath, syncope, tachycardia and weakness. The symptoms have been stable. Past medical history includes atrial fibrillation.    No problem-specific Assessment & Plan notes found for this encounter.   Past Medical History:  Diagnosis Date  . Arthritis   . Atrial fibrillation (HCC)    controlled on coumadin  . GERD (gastroesophageal reflux disease)   . Glaucoma   . Gout   . Heart disease   . Hyperlipidemia     Past Surgical History:  Procedure Laterality Date  . COLONOSCOPY  2005 ?  Marland Kitchen. GLAUCOMA SURGERY      Family History  Problem Relation Age of Onset  . Cancer Maternal Uncle     Social History   Socioeconomic History  . Marital status: Married    Spouse name: Not on file  . Number of children: Not on file  . Years of education: Not on file  . Highest education level: Not on file  Social Needs  . Financial resource strain: Not on file  . Food insecurity - worry: Not on file  . Food insecurity - inability: Not on file  . Transportation needs - medical: Not on file  . Transportation needs - non-medical: Not on file  Occupational History  . Not on file  Tobacco Use  . Smoking status: Former Smoker    Types: Cigars  . Smokeless tobacco: Never Used  Substance and Sexual Activity  . Alcohol use: Yes    Alcohol/week: 0.0 oz  . Drug use: No  . Sexual activity: Yes  Other Topics Concern  . Not on file  Social History Narrative  . Not on file    Allergies  Allergen Reactions  . Ace Inhibitors    . Penicillins     Outpatient Medications Prior to Visit  Medication Sig Dispense Refill  . dorzolamide-timolol (COSOPT) 22.3-6.8 MG/ML ophthalmic solution Apply 1 drop to eye as needed. Dr Beverely Paceheek    . finasteride (PROSCAR) 5 MG tablet Take 1 tablet (5 mg total) daily by mouth. (Patient taking differently: Take 5 mg by mouth daily. Vanna ScotlandAshley Brandon) 90 tablet 3  . latanoprost (XALATAN) 0.005 % ophthalmic solution Apply 1 drop to eye as needed. Dr Beverely Paceheek    . warfarin (COUMADIN) 5 MG tablet Take 1 tablet (5 mg total) by mouth daily. 90 tablet 0  . allopurinol (ZYLOPRIM) 100 MG tablet Take 1 tablet (100 mg total) by mouth 2 (two) times daily. 180 tablet 0  . warfarin (COUMADIN) 6 MG tablet TAKE 1 TABLET DAILY 90 tablet 0  . indomethacin (INDOCIN) 50 MG capsule TAKE 1 CAPSULE BY MOUTH TWICE DAILY WITH A MEAL (Patient not taking: Reported on 04/19/2017) 30 capsule 0   No facility-administered medications prior to visit.     Review of Systems  Constitutional: Negative for chills, fever, malaise/fatigue and weight loss.  HENT: Negative for ear discharge, ear pain and sore throat.   Eyes: Negative for blurred vision.  Respiratory: Negative for cough, sputum production, shortness of breath and wheezing.   Cardiovascular: Negative for chest pain,  palpitations, leg swelling and syncope.  Gastrointestinal: Negative for abdominal pain, blood in stool, constipation, diarrhea, heartburn, melena and nausea.  Genitourinary: Negative for dysuria, frequency, hematuria and urgency.  Musculoskeletal: Negative for back pain, joint pain, myalgias and neck pain.  Skin: Negative for rash.  Neurological: Negative for dizziness, tingling, sensory change, focal weakness, weakness and headaches.  Endo/Heme/Allergies: Negative for environmental allergies and polydipsia. Does not bruise/bleed easily.  Psychiatric/Behavioral: Negative for depression and suicidal ideas. The patient is not nervous/anxious and does not have  insomnia.      Objective  Vitals:   04/19/17 0857  BP: 138/84  Pulse: 64  Weight: 193 lb (87.5 kg)  Height: 5\' 9"  (1.753 m)    Physical Exam  Constitutional: He is oriented to person, place, and time and well-developed, well-nourished, and in no distress.  HENT:  Head: Normocephalic.  Right Ear: External ear normal.  Left Ear: External ear normal.  Nose: Nose normal.  Mouth/Throat: Oropharynx is clear and moist.  Eyes: Conjunctivae and EOM are normal. Pupils are equal, round, and reactive to light. Right eye exhibits no discharge. Left eye exhibits no discharge. No scleral icterus.  Neck: Normal range of motion. Neck supple. No JVD present. No tracheal deviation present. No thyromegaly present.  Cardiovascular: Normal rate, regular rhythm, normal heart sounds and intact distal pulses. Exam reveals no gallop and no friction rub.  No murmur heard. Pulmonary/Chest: Breath sounds normal. No respiratory distress. He has no wheezes. He has no rales.  Abdominal: Soft. Bowel sounds are normal. He exhibits no mass. There is no hepatosplenomegaly. There is no tenderness. There is no rebound, no guarding and no CVA tenderness.  Musculoskeletal: Normal range of motion. He exhibits no edema or tenderness.  Lymphadenopathy:    He has no cervical adenopathy.  Neurological: He is alert and oriented to person, place, and time. He has normal sensation, normal strength, normal reflexes and intact cranial nerves. No cranial nerve deficit.  Skin: Skin is warm. No rash noted.  Psychiatric: Mood and affect normal.  Nursing note and vitals reviewed.     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 allopurinol (ZYLOPRIM) 100 MG tablet    Sig: Take 1 tablet (100 mg total) by mouth 2 (two) times daily.    Dispense:  180 tablet    Refill:  3  . warfarin (COUMADIN) 6 MG tablet    Sig: Take 1 tablet (6 mg total) by mouth daily.    Dispense:  90 tablet    Refill:  1      Dr. Elizabeth Sauer [facility]  04/19/17

## 2017-04-28 ENCOUNTER — Ambulatory Visit: Payer: Self-pay | Admitting: Urology

## 2017-05-09 ENCOUNTER — Other Ambulatory Visit: Payer: Medicare Other

## 2017-05-09 DIAGNOSIS — Z7901 Long term (current) use of anticoagulants: Secondary | ICD-10-CM

## 2017-05-10 LAB — PROTIME-INR
INR: 2.1 — ABNORMAL HIGH (ref 0.8–1.2)
Prothrombin Time: 21.2 s — ABNORMAL HIGH (ref 9.1–12.0)

## 2017-06-14 ENCOUNTER — Other Ambulatory Visit: Payer: Medicare Other

## 2017-06-14 DIAGNOSIS — R69 Illness, unspecified: Secondary | ICD-10-CM

## 2017-06-14 DIAGNOSIS — I4891 Unspecified atrial fibrillation: Secondary | ICD-10-CM

## 2017-06-14 DIAGNOSIS — E785 Hyperlipidemia, unspecified: Secondary | ICD-10-CM

## 2017-06-15 LAB — RENAL FUNCTION PANEL
ALBUMIN: 4.1 g/dL (ref 3.2–4.6)
BUN/Creatinine Ratio: 13 (ref 10–24)
BUN: 14 mg/dL (ref 10–36)
CALCIUM: 8.9 mg/dL (ref 8.6–10.2)
CHLORIDE: 102 mmol/L (ref 96–106)
CO2: 22 mmol/L (ref 20–29)
Creatinine, Ser: 1.09 mg/dL (ref 0.76–1.27)
GFR calc Af Amer: 69 mL/min/{1.73_m2} (ref >59)
GFR calc non Af Amer: 59 mL/min/{1.73_m2} — ABNORMAL LOW (ref >59)
GLUCOSE: 90 mg/dL (ref 65–99)
POTASSIUM: 4.5 mmol/L (ref 3.5–5.2)
Phosphorus: 2.7 mg/dL (ref 2.5–4.5)
Sodium: 142 mmol/L (ref 134–144)

## 2017-06-15 LAB — LIPID PANEL WITH LDL/HDL RATIO
CHOLESTEROL TOTAL: 160 mg/dL (ref 100–199)
HDL: 36 mg/dL — AB (ref >39)
LDL Calculated: 66 mg/dL (ref 0–99)
LDl/HDL Ratio: 1.8 ratio (ref 0.0–3.6)
Triglycerides: 292 mg/dL — ABNORMAL HIGH (ref 0–149)
VLDL Cholesterol Cal: 58 mg/dL — ABNORMAL HIGH (ref 5–40)

## 2017-06-15 LAB — PROTIME-INR
INR: 2.9 — ABNORMAL HIGH (ref 0.8–1.2)
Prothrombin Time: 28.5 s — ABNORMAL HIGH (ref 9.1–12.0)

## 2017-06-28 ENCOUNTER — Telehealth: Payer: Self-pay | Admitting: Family Medicine

## 2017-06-28 NOTE — Telephone Encounter (Signed)
Called to schedule AWV with Nurse Health Advisor °Thank you! °For questions please contact: °Kathryn Brown °[phone]  °Skype [email or web address]  ° °

## 2017-07-07 ENCOUNTER — Ambulatory Visit (INDEPENDENT_AMBULATORY_CARE_PROVIDER_SITE_OTHER): Payer: Medicare Other | Admitting: Family Medicine

## 2017-07-07 ENCOUNTER — Encounter: Payer: Self-pay | Admitting: Family Medicine

## 2017-07-07 VITALS — BP 120/70 | HR 76 | Ht 69.0 in | Wt 191.0 lb

## 2017-07-07 DIAGNOSIS — M1A4710 Other secondary chronic gout, right ankle and foot, without tophus (tophi): Secondary | ICD-10-CM

## 2017-07-07 DIAGNOSIS — N401 Enlarged prostate with lower urinary tract symptoms: Secondary | ICD-10-CM | POA: Diagnosis not present

## 2017-07-07 DIAGNOSIS — R351 Nocturia: Secondary | ICD-10-CM

## 2017-07-07 MED ORDER — ALLOPURINOL 100 MG PO TABS
100.0000 mg | ORAL_TABLET | Freq: Two times a day (BID) | ORAL | 3 refills | Status: DC
Start: 2017-07-07 — End: 2017-07-19

## 2017-07-07 MED ORDER — FINASTERIDE 5 MG PO TABS: 5.0000 mg | ORAL_TABLET | Freq: Every day | ORAL | 1 refills | Status: DC

## 2017-07-07 NOTE — Progress Notes (Signed)
Name: Erik Larson   MRN: [id number]    DOB: Mar 18, 1924   Date:07/07/2017       Progress Note  Subjective  Chief Complaint  Chief Complaint  Patient presents with  . Gout    Patient presents for gout prophyllaxis. Patient tolerating allopurinol well. No acute attacks in over a year,  Benign Prostatic Hypertrophy  This is a chronic problem. The current episode started more than 1 year ago. The problem has been waxing and waning since onset. Irritative symptoms do not include frequency or urgency. Obstructive symptoms include dribbling, a slower stream and a weak stream. Obstructive symptoms do not include an intermittent stream or straining. Associated symptoms include hesitancy. Pertinent negatives include no chills, dysuria, hematuria or nausea. Nothing aggravates the symptoms. Past treatments include finasteride. The treatment provided mild relief.    No problem-specific Assessment & Plan notes found for this encounter.   Past Medical History:  Diagnosis Date  . Arthritis   . Atrial fibrillation (HCC)    controlled on coumadin  . GERD (gastroesophageal reflux disease)   . Glaucoma   . Gout   . Heart disease   . Hyperlipidemia     Past Surgical History:  Procedure Laterality Date  . COLONOSCOPY  2005 ?  Marland Kitchen GLAUCOMA SURGERY      Family History  Problem Relation Age of Onset  . Cancer Maternal Uncle     Social History   Socioeconomic History  . Marital status: Married    Spouse name: Not on file  . Number of children: Not on file  . Years of education: Not on file  . Highest education level: Not on file  Occupational History  . Not on file  Social Needs  . Financial resource strain: Not on file  . Food insecurity:    Worry: Not on file    Inability: Not on file  . Transportation needs:    Medical: Not on file    Non-medical: Not on file  Tobacco Use  . Smoking status: Former Smoker    Types: Cigars  . Smokeless tobacco: Never Used  Substance and Sexual  Activity  . Alcohol use: Yes    Alcohol/week: 0.0 oz  . Drug use: No  . Sexual activity: Yes  Lifestyle  . Physical activity:    Days per week: Not on file    Minutes per session: Not on file  . Stress: Not on file  Relationships  . Social connections:    Talks on phone: Not on file    Gets together: Not on file    Attends religious service: Not on file    Active member of club or organization: Not on file    Attends meetings of clubs or organizations: Not on file    Relationship status: Not on file  . Intimate partner violence:    Fear of current or ex partner: Not on file    Emotionally abused: Not on file    Physically abused: Not on file    Forced sexual activity: Not on file  Other Topics Concern  . Not on file  Social History Narrative  . Not on file    Allergies  Allergen Reactions  . Ace Inhibitors   . Penicillins     Outpatient Medications Prior to Visit  Medication Sig Dispense Refill  . dorzolamide-timolol (COSOPT) 22.3-6.8 MG/ML ophthalmic solution Apply 1 drop to eye as needed. Dr Beverely Pace    . indomethacin (INDOCIN) 50 MG capsule TAKE 1 CAPSULE  BY MOUTH TWICE DAILY WITH A MEAL 30 capsule 0  . latanoprost (XALATAN) 0.005 % ophthalmic solution Apply 1 drop to eye as needed. Dr Beverely Paceheek    . warfarin (COUMADIN) 5 MG tablet Take 1 tablet (5 mg total) by mouth daily. 90 tablet 0  . warfarin (COUMADIN) 6 MG tablet Take 1 tablet (6 mg total) by mouth daily. 90 tablet 1  . allopurinol (ZYLOPRIM) 100 MG tablet Take 1 tablet (100 mg total) by mouth 2 (two) times daily. 180 tablet 3  . finasteride (PROSCAR) 5 MG tablet Take 1 tablet (5 mg total) daily by mouth. (Patient taking differently: Take 5 mg by mouth daily. Vanna ScotlandAshley Brandon) 90 tablet 3   No facility-administered medications prior to visit.     Review of Systems  Constitutional: Negative for chills, fever, malaise/fatigue and weight loss.  HENT: Negative for ear discharge, ear pain and sore throat.   Eyes:  Negative for blurred vision.  Respiratory: Negative for cough, sputum production, shortness of breath and wheezing.   Cardiovascular: Negative for chest pain, palpitations and leg swelling.  Gastrointestinal: Negative for abdominal pain, blood in stool, constipation, diarrhea, heartburn, melena and nausea.  Genitourinary: Positive for hesitancy. Negative for dysuria, frequency, hematuria and urgency.  Musculoskeletal: Negative for back pain, joint pain, myalgias and neck pain.  Skin: Negative for rash.  Neurological: Negative for dizziness, tingling, sensory change, focal weakness and headaches.  Endo/Heme/Allergies: Negative for environmental allergies and polydipsia. Does not bruise/bleed easily.  Psychiatric/Behavioral: Negative for depression and suicidal ideas. The patient is not nervous/anxious and does not have insomnia.      Objective  Vitals:   07/07/17 0805  BP: 120/70  Pulse: 76  Weight: 191 lb (86.6 kg)  Height: 5\' 9"  (1.753 m)    Physical Exam  Constitutional: He is oriented to person, place, and time.  HENT:  Head: Normocephalic.  Right Ear: External ear normal.  Left Ear: External ear normal.  Nose: Nose normal.  Mouth/Throat: Oropharynx is clear and moist.  Eyes: Pupils are equal, round, and reactive to light. Conjunctivae and EOM are normal. Right eye exhibits no discharge. Left eye exhibits no discharge. No scleral icterus.  Neck: Normal range of motion. Neck supple. No JVD present. No tracheal deviation present. No thyromegaly present.  Cardiovascular: Normal rate, regular rhythm, normal heart sounds and intact distal pulses. Exam reveals no gallop and no friction rub.  No murmur heard. Pulmonary/Chest: Breath sounds normal. No respiratory distress. He has no wheezes. He has no rales.  Abdominal: Soft. Bowel sounds are normal. He exhibits no mass. There is no hepatosplenomegaly. There is no tenderness. There is no rebound, no guarding and no CVA tenderness.   Musculoskeletal: Normal range of motion. He exhibits no edema or tenderness.  Lymphadenopathy:    He has no cervical adenopathy.  Neurological: He is alert and oriented to person, place, and time. He has normal strength and normal reflexes. No cranial nerve deficit.  Skin: Skin is warm. No rash noted.  Nursing note and vitals reviewed.     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 allopurinol (ZYLOPRIM) 100 MG tablet    Sig: Take 1 tablet (100 mg total) by mouth 2 (two) times daily.    Dispense:  180 tablet    Refill:  3  . finasteride (PROSCAR) 5 MG tablet    Sig: Take 1 tablet (5 mg total) by mouth daily.    Dispense:  90 tablet    Refill:  1      Dr. Elizabeth Sauer [facility]  07/07/17

## 2017-07-07 NOTE — Patient Instructions (Signed)

## 2017-07-08 LAB — URIC ACID: Uric Acid: 5.2 mg/dL (ref 3.7–8.6)

## 2017-07-12 ENCOUNTER — Ambulatory Visit (INDEPENDENT_AMBULATORY_CARE_PROVIDER_SITE_OTHER): Payer: Medicare Other

## 2017-07-12 VITALS — BP 138/68 | HR 64 | Temp 97.9°F | Resp 14 | Ht 68.0 in | Wt 186.4 lb

## 2017-07-12 DIAGNOSIS — Z0000 Encounter for general adult medical examination without abnormal findings: Secondary | ICD-10-CM

## 2017-07-12 NOTE — Progress Notes (Signed)
Subjective:   Erik Larson is a 84 y.o. male who presents for an Initial Medicare Annual Wellness Visit.  Review of Systems  N/A Cardiac Risk Factors include: hypertension;advanced age (>43men, >44 women);male gender;obesity (BMI >30kg/m2);sedentary lifestyle    Objective:    Today's Vitals   07/12/17 1109  BP: 138/68  Pulse: 64  Resp: 14  Temp: 97.9 F (36.6 C)  TempSrc: Oral  SpO2: 96%  Weight: 186 lb 6.4 oz (84.6 kg)  Height:  (1.727 m)   Body mass index is 28.34 kg/m.  Advanced Directives 07/12/2017 11/27/2014  Does Patient Have a Medical Advance Directive? Yes Yes  Type of Estate agent of [place];Living will Living will  Copy of Healthcare Power of Attorney in Chart? No - copy requested No - copy requested    Current Medications (verified) Outpatient Encounter Medications as of 07/12/2017  Medication Sig  . allopurinol (ZYLOPRIM) 100 MG tablet Take 1 tablet (100 mg total) by mouth 2 (two) times daily.  . dorzolamide-timolol (COSOPT) 22.3-6.8 MG/ML ophthalmic solution Apply 1 drop to eye as needed. Dr Beverely Pace  . finasteride (PROSCAR) 5 MG tablet Take 1 tablet (5 mg total) by mouth daily.  Marland Kitchen latanoprost (XALATAN) 0.005 % ophthalmic solution Apply 1 drop to eye as needed. Dr Beverely Pace  . warfarin (COUMADIN) 6 MG tablet Take 1 tablet (6 mg total) by mouth daily.  . [DISCONTINUED] indomethacin (INDOCIN) 50 MG capsule TAKE 1 CAPSULE BY MOUTH TWICE DAILY WITH A MEAL  . [DISCONTINUED] warfarin (COUMADIN) 5 MG tablet Take 1 tablet (5 mg total) by mouth daily.   No facility-administered encounter medications on file as of 07/12/2017.     Allergies (verified) Ace inhibitors and Penicillins   History: Past Medical History:  Diagnosis Date  . Arthritis   . Atrial fibrillation (HCC)    controlled on coumadin  . GERD (gastroesophageal reflux disease)   . Glaucoma   . Gout   . Heart disease   . Hyperlipidemia    Past Surgical History:  Procedure  Laterality Date  . COLONOSCOPY  2005 ?  Marland Kitchen GLAUCOMA SURGERY     Family History  Problem Relation Age of Onset  . Cancer Maternal Uncle   . Alzheimer's disease Mother   . Healthy Father   . Healthy Sister   . Healthy Sister   . Healthy Sister   . Heart disease Brother    Social History   Socioeconomic History  . Marital status: Married    Spouse name: Erik Larson  . Number of children: 2  . Years of education: Not on file  . Highest education level: 10th grade  Occupational History  . Occupation: Retired  Engineer, production  . Financial resource strain: Not hard at all  . Food insecurity:    Worry: Never true    Inability: Never true  . Transportation needs:    Medical: No    Non-medical: No  Tobacco Use  . Smoking status: Former Smoker    Years: 5.00    Types: Cigars    Last attempt to quit: 1970    Years since quitting: 49.3  . Smokeless tobacco: Never Used  . Tobacco comment: smoking cessation materials not required  Substance and Sexual Activity  . Alcohol use: Not Currently    Alcohol/week: 0.0 oz  . Drug use: No  . Sexual activity: Not Currently  Lifestyle  . Physical activity:    Days per week: 0 days    Minutes per session:  0 min  . Stress: Not at all  Relationships  . Social connections:    Talks on phone: Patient refused    Gets together: Patient refused    Attends religious service: Patient refused    Active member of club or organization: Patient refused    Attends meetings of clubs or organizations: Patient refused    Relationship status: Married  Other Topics Concern  . Not on file  Social History Narrative  . Not on file   Tobacco Counseling Counseling given: No Comment: smoking cessation materials not required  Clinical Intake:  Pre-visit preparation completed: Yes  Pain : No/denies pain   BMI - recorded: 28.34 Nutritional Status: BMI > 30  Obese Nutritional Risks: None Diabetes: No  How often do you need to have someone help you when  you read instructions, pamphlets, or other written materials from your doctor or pharmacy?: 1 - Never  Interpreter Needed?: No  Information entered by :: AEversole, LPN  Activities of Daily Living In your present state of health, do you have any difficulty performing the following activities: 07/12/2017  Hearing? N  Comment L hearing aid  Vision? N  Comment wears eyeglasses; glaucoma  Difficulty concentrating or making decisions? N  Walking or climbing stairs? Y  Comment back pain  Dressing or bathing? N  Doing errands, shopping? N  Preparing Food and eating ? N  Comment partial upper and lower dentures  Using the Toilet? N  In the past six months, have you accidently leaked urine? N  Do you have problems with loss of bowel control? N  Managing your Medications? N  Managing your Finances? N  Housekeeping or managing your Housekeeping? N  Some recent data might be hidden     Immunizations and Health Maintenance Immunization History  Administered Date(s) Administered  . Influenza, High Dose Seasonal PF 12/21/2016  . Influenza, Seasonal, Injecte, Preservative Fre 01/03/2011  . Influenza,inj,Quad PF,6+ Mos 12/03/2012, 11/27/2014  . Pneumococcal Conjugate-13 04/19/2017   There are no preventive care reminders to display for this patient.  Patient Care Team: Duanne Limerick, MD as PCP - General (Family Medicine)  Indicate any recent Medical Services you may have received from other than [facility] providers in the past year (date may be approximate).    Assessment:   This is a routine wellness examination for Uriyah.  Hearing/Vision screen Vision Screening Comments: Sees Dr. Beverely Pace for annual eye exams  Dietary issues and exercise activities discussed: Current Exercise Habits: The patient does not participate in regular exercise at present, Exercise limited by: None identified  Goals    . DIET - INCREASE WATER INTAKE     Recommend to drink at least 6-8 8oz glasses of water per  day.      Depression Screen PHQ 2/9 Scores 07/12/2017 04/19/2017 12/23/2016 12/23/2016  PHQ - 2 Score 0 0 0 0  PHQ- 9 Score 0 0 2 -    Fall Risk Fall Risk  07/12/2017 04/19/2017 12/23/2016 11/27/2014  Falls in the past year? No No Yes No  Number falls in past yr: - - 1 -  Injury with Fall? - - No -  Risk for fall due to : History of fall(s);Impaired vision - - -  Risk for fall due to: Comment fell beside the toilet; glaucoma - - -  Follow up - - Falls evaluation completed -    Is the home free of loose throw rugs in walkways, pet beds, electrical cords, etc? Yes Adequate lighting to  reduce risk of falls?  Yes In addition, does the patient have any of the following: Stairs in or around the home WITH handrails? Yes Grab bars in the bathroom? Yes  Shower chair or a place to sit while bathing? Yes Use of an elevated toilet seat or a handicapped toilet? Yes Use of a cane, walker or w/c? No  Timed Get Up and Go Performed: Yes. Pt ambulated 10 feet within 15 sec. Gait slow, steady and without the use of an assistive device. No intervention required at this time. Fall risk prevention has been discussed.  Community Resource Referral not required at this time.  Cognitive Function:     6CIT Screen 07/12/2017  What Year? 4 points  What month? 3 points  What time? 3 points  Count back from 20 0 points  Months in reverse 0 points  Repeat phrase 6 points  Total Score 16    Screening Tests Health Maintenance  Topic Date Due  . TETANUS/TDAP  07/08/2018 (Originally 02/04/1944)  . INFLUENZA VACCINE  10/12/2017  . PNA vac Low Risk Adult (2 of 2 - PPSV23) 04/19/2018    Qualifies for Shingles Vaccine? Yes. Due for Zostavax or Shingrix vaccine. Education has been provided regarding the importance of this vaccine. Pt has been advised to call his insurance company to determine his out of pocket expense. Advised he may also receive this vaccine at his local pharmacy or Health Dept. Verbalized  acceptance and understanding.  Due for Tdap vaccine. Education has been provided regarding the importance of this vaccine. Pt has been advised he may receive this vaccine at his local pharmacy or Health Dept. Also advised to provide a copy of his vaccination record if he chooses to receive this vaccine at his local pharmacy. Verbalized acceptance and understanding.  Cancer Screenings: Lung: Low Dose CT Chest recommended if Age 79-80 years, 30 pack-year currently smoking OR have quit w/in 15years. Patient does not qualify. Colorectal: No longer required  Additional Screenings: Hepatitis C Screening: Does not qualify    Plan:  I have personally reviewed and addressed the Medicare Annual Wellness questionnaire and have noted the following in the patient's chart:  A. Medical and social history B. Use of alcohol, tobacco or illicit drugs  C. Current medications and supplements D. Functional ability and status E.  Nutritional status F.  Physical activity G. Advance directives H. List of other physicians I.  Hospitalizations, surgeries, and ER visits in previous 12 months J.  Vitals K. Screenings such as hearing and vision if needed, cognitive and depression L. Referrals and appointments  In addition, I have reviewed and discussed with patient certain preventive protocols, quality metrics, and best practice recommendations. A written personalized care plan for preventive services as well as general preventive health recommendations were provided to patient.  Signed,  Deon Pilling, LPN Nurse Health Advisor  MD Recommendations: Due for Zostavax or Shingrix vaccine. Education has been provided regarding the importance of this vaccine. Pt has been advised to call his insurance company to determine his out of pocket expense. Advised he may also receive this vaccine at his local pharmacy or Health Dept. Verbalized acceptance and understanding.  Due for Tdap vaccine. Education has been provided  regarding the importance of this vaccine. Pt has been advised he may receive this vaccine at his local pharmacy or Health Dept. Also advised to provide a copy of his vaccination record if he chooses to receive this vaccine at his local pharmacy. Verbalized acceptance and understanding.  6CIT score = 16

## 2017-07-12 NOTE — Patient Instructions (Signed)
Erik Larson , Thank you for taking time to come for your Medicare Wellness Visit. I appreciate your ongoing commitment to your health goals. Please review the following plan we discussed and let me know if I can assist you in the future.   Screening recommendations/referrals: Colorectal Screening: No longer required  Vision and Dental Exams: Recommended annual ophthalmology exams for early detection of glaucoma and other disorders of the eye Recommended annual dental exams for proper oral hygiene  Vaccinations: Influenza vaccine: Up to date Pneumococcal vaccine: Up to date Tdap vaccine: Declined. Please call your insurance company to determine your out of pocket expense. You may also receive this vaccine at your local pharmacy or Health Dept. Shingles vaccine: Please call your insurance company to determine your out of pocket expense for the Shingrix vaccine. You may also receive this vaccine at your local pharmacy or Health Dept.  Advanced directives: Please bring a copy of your POA (Power of Attorney) and/or Living Will to your next appointment.  Conditions/risks identified: Recommend to drink at least 6-8 8oz glasses of water per day.  Next appointment: Please schedule your Annual Wellness Visit with your Nurse Health Advisor in one year.  Preventive Care 52 Years and Older, Male Preventive care refers to lifestyle choices and visits with your health care provider that can promote health and wellness. What does preventive care include?  A yearly physical exam. This is also called an annual well check.  Dental exams once or twice a year.  Routine eye exams. Ask your health care provider how often you should have your eyes checked.  Personal lifestyle choices, including:  Daily care of your teeth and gums.  Regular physical activity.  Eating a healthy diet.  Avoiding tobacco and drug use.  Limiting alcohol use.  Practicing safe sex.  Taking low doses of aspirin every  day.  Taking vitamin and mineral supplements as recommended by your health care provider. What happens during an annual well check? The services and screenings done by your health care provider during your annual well check will depend on your age, overall health, lifestyle risk factors, and family history of disease. Counseling  Your health care provider may ask you questions about your:  Alcohol use.  Tobacco use.  Drug use.  Emotional well-being.  Home and relationship well-being.  Sexual activity.  Eating habits.  History of falls.  Memory and ability to understand (cognition).  Work and work Astronomer. Screening  You may have the following tests or measurements:  Height, weight, and BMI.  Blood pressure.  Lipid and cholesterol levels. These may be checked every 5 years, or more frequently if you are over 17 years old.  Skin check.  Lung cancer screening. You may have this screening every year starting at age 66 if you have a 30-pack-year history of smoking and currently smoke or have quit within the past 15 years.  Fecal occult blood test (FOBT) of the stool. You may have this test every year starting at age 56.  Flexible sigmoidoscopy or colonoscopy. You may have a sigmoidoscopy every 5 years or a colonoscopy every 10 years starting at age 4.  Prostate cancer screening. Recommendations will vary depending on your family history and other risks.  Hepatitis C blood test.  Hepatitis B blood test.  Sexually transmitted disease (STD) testing.  Diabetes screening. This is done by checking your blood sugar (glucose) after you have not eaten for a while (fasting). You may have this done every 1-3 years.  Abdominal aortic aneurysm (AAA) screening. You may need this if you are a current or former smoker.  Osteoporosis. You may be screened starting at age 37 if you are at high risk. Talk with your health care provider about your test results, treatment options,  and if necessary, the need for more tests. Vaccines  Your health care provider may recommend certain vaccines, such as:  Influenza vaccine. This is recommended every year.  Tetanus, diphtheria, and acellular pertussis (Tdap, Td) vaccine. You may need a Td booster every 10 years.  Zoster vaccine. You may need this after age 29.  Pneumococcal 13-valent conjugate (PCV13) vaccine. One dose is recommended after age 33.  Pneumococcal polysaccharide (PPSV23) vaccine. One dose is recommended after age 46. Talk to your health care provider about which screenings and vaccines you need and how often you need them. This information is not intended to replace advice given to you by your health care provider. Make sure you discuss any questions you have with your health care provider. Document Released: 03/27/2015 Document Revised: 11/18/2015 Document Reviewed: 12/30/2014 Elsevier Interactive Patient Education  2017 [employer] Prevention in the Home Falls can cause injuries. They can happen to people of all ages. There are many things you can do to make your home safe and to help prevent falls. What can I do on the outside of my home?  Regularly fix the edges of walkways and driveways and fix any cracks.  Remove anything that might make you trip as you walk through a door, such as a raised step or threshold.  Trim any bushes or trees on the path to your home.  Use bright outdoor lighting.  Clear any walking paths of anything that might make someone trip, such as rocks or tools.  Regularly check to see if handrails are loose or broken. Make sure that both sides of any steps have handrails.  Any raised decks and porches should have guardrails on the edges.  Have any leaves, snow, or ice cleared regularly.  Use sand or salt on walking paths during winter.  Clean up any spills in your garage right away. This includes oil or grease spills. What can I do in the bathroom?  Use night  lights.  Install grab bars by the toilet and in the tub and shower. Do not use towel bars as grab bars.  Use non-skid mats or decals in the tub or shower.  If you need to sit down in the shower, use a plastic, non-slip stool.  Keep the floor dry. Clean up any water that spills on the floor as soon as it happens.  Remove soap buildup in the tub or shower regularly.  Attach bath mats securely with double-sided non-slip rug tape.  Do not have throw rugs and other things on the floor that can make you trip. What can I do in the bedroom?  Use night lights.  Make sure that you have a light by your bed that is easy to reach.  Do not use any sheets or blankets that are too big for your bed. They should not hang down onto the floor.  Have a firm chair that has side arms. You can use this for support while you get dressed.  Do not have throw rugs and other things on the floor that can make you trip. What can I do in the kitchen?  Clean up any spills right away.  Avoid walking on wet floors.  Keep items that you use  a lot in easy-to-reach places.  If you need to reach something above you, use a strong step stool that has a grab bar.  Keep electrical cords out of the way.  Do not use floor polish or wax that makes floors slippery. If you must use wax, use non-skid floor wax.  Do not have throw rugs and other things on the floor that can make you trip. What can I do with my stairs?  Do not leave any items on the stairs.  Make sure that there are handrails on both sides of the stairs and use them. Fix handrails that are broken or loose. Make sure that handrails are as long as the stairways.  Check any carpeting to make sure that it is firmly attached to the stairs. Fix any carpet that is loose or worn.  Avoid having throw rugs at the top or bottom of the stairs. If you do have throw rugs, attach them to the floor with carpet tape.  Make sure that you have a light switch at the  top of the stairs and the bottom of the stairs. If you do not have them, ask someone to add them for you. What else can I do to help prevent falls?  Wear shoes that:  Do not have high heels.  Have rubber bottoms.  Are comfortable and fit you well.  Are closed at the toe. Do not wear sandals.  If you use a stepladder:  Make sure that it is fully opened. Do not climb a closed stepladder.  Make sure that both sides of the stepladder are locked into place.  Ask someone to hold it for you, if possible.  Clearly mark and make sure that you can see:  Any grab bars or handrails.  First and last steps.  Where the edge of each step is.  Use tools that help you move around (mobility aids) if they are needed. These include:  Canes.  Walkers.  Scooters.  Crutches.  Turn on the lights when you go into a dark area. Replace any light bulbs as soon as they burn out.  Set up your furniture so you have a clear path. Avoid moving your furniture around.  If any of your floors are uneven, fix them.  If there are any pets around you, be aware of where they are.  Review your medicines with your doctor. Some medicines can make you feel dizzy. This can increase your chance of falling. Ask your doctor what other things that you can do to help prevent falls. This information is not intended to replace advice given to you by your health care provider. Make sure you discuss any questions you have with your health care provider. Document Released: 12/25/2008 Document Revised: 08/06/2015 Document Reviewed: 04/04/2014 Elsevier Interactive Patient Education  2017 [employer].

## 2017-07-18 ENCOUNTER — Other Ambulatory Visit: Payer: Medicare Other

## 2017-07-18 DIAGNOSIS — Z7901 Long term (current) use of anticoagulants: Secondary | ICD-10-CM

## 2017-07-19 ENCOUNTER — Other Ambulatory Visit: Payer: Self-pay

## 2017-07-19 DIAGNOSIS — M1A4710 Other secondary chronic gout, right ankle and foot, without tophus (tophi): Secondary | ICD-10-CM

## 2017-07-19 LAB — PROTIME-INR
INR: 2 — ABNORMAL HIGH (ref 0.8–1.2)
PROTHROMBIN TIME: 20.2 s — AB (ref 9.1–12.0)

## 2017-07-19 MED ORDER — ALLOPURINOL 100 MG PO TABS: 100.0000 mg | ORAL_TABLET | Freq: Two times a day (BID) | ORAL | 1 refills | Status: DC

## 2017-08-21 ENCOUNTER — Other Ambulatory Visit: Payer: Medicare Other

## 2017-08-21 DIAGNOSIS — Z7901 Long term (current) use of anticoagulants: Secondary | ICD-10-CM

## 2017-08-22 LAB — PROTIME-INR
INR: 2.4 — AB (ref 0.8–1.2)
PROTHROMBIN TIME: 23.9 s — AB (ref 9.1–12.0)

## 2017-09-20 ENCOUNTER — Other Ambulatory Visit: Payer: Medicare Other

## 2017-09-20 DIAGNOSIS — Z7901 Long term (current) use of anticoagulants: Secondary | ICD-10-CM

## 2017-09-21 LAB — PROTIME-INR
INR: 2.4 — ABNORMAL HIGH (ref 0.8–1.2)
PROTHROMBIN TIME: 23.9 s — AB (ref 9.1–12.0)

## 2017-12-20 ENCOUNTER — Other Ambulatory Visit: Payer: Self-pay | Admitting: Family Medicine

## 2017-12-20 DIAGNOSIS — I4891 Unspecified atrial fibrillation: Secondary | ICD-10-CM

## 2018-07-16 ENCOUNTER — Ambulatory Visit: Payer: Self-pay

## 2020-11-12 ENCOUNTER — Emergency Department: Payer: Medicare Other

## 2020-11-12 ENCOUNTER — Other Ambulatory Visit: Payer: Self-pay

## 2020-11-12 DIAGNOSIS — Z5321 Procedure and treatment not carried out due to patient leaving prior to being seen by health care provider: Secondary | ICD-10-CM | POA: Diagnosis not present

## 2020-11-12 DIAGNOSIS — R4182 Altered mental status, unspecified: Secondary | ICD-10-CM | POA: Diagnosis present

## 2020-11-12 LAB — COMPREHENSIVE METABOLIC PANEL
ALT: 14 U/L (ref 0–44)
AST: 26 U/L (ref 15–41)
Albumin: 4 g/dL (ref 3.5–5.0)
Alkaline Phosphatase: 87 U/L (ref 38–126)
Anion gap: 6 (ref 5–15)
BUN: 18 mg/dL (ref 8–23)
CO2: 23 mmol/L (ref 22–32)
Calcium: 8.6 mg/dL — ABNORMAL LOW (ref 8.9–10.3)
Chloride: 107 mmol/L (ref 98–111)
Creatinine, Ser: 0.98 mg/dL (ref 0.61–1.24)
GFR, Estimated: >60 mL/min (ref >60)
Glucose, Bld: 96 mg/dL (ref 70–99)
Potassium: 3.8 mmol/L (ref 3.5–5.1)
Sodium: 136 mmol/L (ref 135–145)
Total Bilirubin: 0.7 mg/dL (ref 0.3–1.2)
Total Protein: 7 g/dL (ref 6.5–8.1)

## 2020-11-12 LAB — URINALYSIS, COMPLETE (UACMP) WITH MICROSCOPIC
Bacteria, UA: NONE SEEN
Bilirubin Urine: NEGATIVE
Glucose, UA: NEGATIVE mg/dL
Ketones, ur: NEGATIVE mg/dL
Nitrite: NEGATIVE
Protein, ur: NEGATIVE mg/dL
Specific Gravity, Urine: 1.021 (ref 1.005–1.030)
Squamous Epithelial / HPF: NONE SEEN (ref 0–5)
pH: 5 (ref 5.0–8.0)

## 2020-11-12 LAB — CBC
HCT: 34.3 % — ABNORMAL LOW (ref 39.0–52.0)
Hemoglobin: 12.2 g/dL — ABNORMAL LOW (ref 13.0–17.0)
MCH: 34.2 pg — ABNORMAL HIGH (ref 26.0–34.0)
MCHC: 35.6 g/dL (ref 30.0–36.0)
MCV: 96.1 fL (ref 80.0–100.0)
Platelets: 220 10*3/uL (ref 150–400)
RBC: 3.57 MIL/uL — ABNORMAL LOW (ref 4.22–5.81)
RDW: 13 % (ref 11.5–15.5)
WBC: 8.6 10*3/uL (ref 4.0–10.5)
nRBC: 0 % (ref 0.0–0.2)

## 2020-11-12 LAB — TROPONIN I (HIGH SENSITIVITY)
Troponin I (High Sensitivity): 12 ng/L (ref <18)
Troponin I (High Sensitivity): 13 ng/L (ref <18)

## 2020-11-12 NOTE — ED Triage Notes (Addendum)
Pt in with co altered mental status that started a few days ago, no other symptoms. NO fever, no dysuria, no n.v.d or cold symptoms. Pt does not have hx of the same. 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 Pt has complaints of feeling weak all over. No know trauma or falls.

## 2020-11-13 ENCOUNTER — Emergency Department
Admission: EM | Admit: 2020-11-13 | Discharge: 2020-11-13 | Disposition: A | Discharge status: Home / Self Care | Payer: Medicare Other | Attending: Emergency Medicine | Admitting: Emergency Medicine

## 2021-06-08 ENCOUNTER — Other Ambulatory Visit: Payer: Self-pay

## 2021-06-08 ENCOUNTER — Ambulatory Visit
Admission: EM | Admit: 2021-06-08 | Discharge: 2021-06-08 | Disposition: A | Discharge status: Home / Self Care | Payer: Medicare Other | Attending: Internal Medicine | Admitting: Internal Medicine

## 2021-06-08 DIAGNOSIS — R410 Disorientation, unspecified: Secondary | ICD-10-CM | POA: Insufficient documentation

## 2021-06-08 LAB — URINALYSIS, MICROSCOPIC (REFLEX)
Bacteria, UA: NONE SEEN
RBC / HPF: >50 RBC/hpf (ref 0–5)

## 2021-06-08 LAB — URINALYSIS, ROUTINE W REFLEX MICROSCOPIC
Bilirubin Urine: NEGATIVE
Glucose, UA: NEGATIVE mg/dL
Ketones, ur: NEGATIVE mg/dL
Leukocytes,Ua: NEGATIVE
Nitrite: NEGATIVE
Specific Gravity, Urine: 1.02 (ref 1.005–1.030)
pH: 6 (ref 5.0–8.0)

## 2021-06-08 MED ORDER — CIPROFLOXACIN HCL 500 MG PO TABS: 500.0000 mg | ORAL_TABLET | Freq: Two times a day (BID) | ORAL | 0 refills | Status: DC

## 2021-06-08 NOTE — Discharge Instructions (Signed)
Altered mental status and urinary symptoms are consistent with a urinary tract infection.  Urinalysis at the urgent care today is positive for white blooc cells and red blood cells, consistent with possible urinary tract infection; a culture is pending.  Prescription for cipro (antibiotic) was sent to the pharmacy.  Cipro can be taken with or without food.  The urgent care will contact you if a change in treatment is needed, based on culture results.  Push fluids and rest.  Anticipate gradual improvement over the next several days.  Recheck or followup with the primary care provider for worsening confusion, persistent fever >100.5, persistent vomiting, or if just not improving as expected. ?

## 2021-06-08 NOTE — ED Provider Notes (Signed)
?[facility] ? ? ? ?CSN: [id number] ?Arrival date & time: 06/08/21  1308 ? ? ?  ? ?History   ?Chief Complaint ?Chief Complaint  ?Patient presents with  ? Urinary Tract Infection  ? Hallucinations  ? ? ?HPI ?Erik Larson is a 88 y.o. male. He is here with altered mental status, concern for UTI.  Daughter is with him and she recalls patient had difficulty starting his urinary stream in last few days.  Today, has reported having urinary frequency, but doesn't always have to go.  Also today he is seeing people that aren't there in his bed, and in his house, like deceased family members.  He had similar symptoms in 11/2020, when he was thought to have a UTI and treated successfully with 2 weeks of cipro. ? ? ?Urinary Tract Infection ? ?Past Medical History:  ?Diagnosis Date  ? Arthritis   ? Atrial fibrillation ([place])   ? controlled on coumadin  ? GERD (gastroesophageal reflux disease)   ? Glaucoma   ? Gout   ? Heart disease   ? Hyperlipidemia   ? ? ?Patient Active Problem List  ? Diagnosis Date Noted  ? Hyperuricemia 10/09/2015  ? BPH associated with nocturia 10/09/2015  ? 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 Interval gout 07/29/2014  ? Diverticulitis of colon 07/29/2014  ? Long term current use of anticoagulant therapy 07/29/2014  ? Atrial fibrillation, controlled ([place]) 07/29/2014  ? Acid reflux 07/29/2014  ? Warfarin therapy started 07/29/2014  ? ? ?Past Surgical History:  ?Procedure Laterality Date  ? COLONOSCOPY  2005 ?  ? GLAUCOMA SURGERY    ? ? ? ? ? ?Home Medications   ? ?Prior to Admission medications   ?Medication Sig Start Date End Date Taking? Authorizing Provider  ?allopurinol (ZYLOPRIM) 100 MG tablet Take 1 tablet (100 mg total) by mouth 2 (two) times daily. 07/19/17  Yes Juline Patch, MD  ?ciprofloxacin (CIPRO) 500 MG tablet Take 1 tablet (500 mg total) by mouth 2 (two) times daily. 06/08/21  Yes Wynona Luna, MD  ?dorzolamide-timolol (COSOPT) 22.3-6.8 MG/ML  ophthalmic solution Apply 1 drop to eye as needed. Dr Atilano Median   Yes [provider]  ?finasteride (PROSCAR) 5 MG tablet Take 1 tablet (5 mg total) by mouth daily. 07/07/17  Yes Juline Patch, MD  ?latanoprost (XALATAN) 0.005 % ophthalmic solution Apply 1 drop to eye as needed. Dr Atilano Median   Yes [provider]  ?warfarin (COUMADIN) 6 MG tablet TAKE 1 TABLET DAILY 12/20/17  Yes Juline Patch, MD  ? ? ?Family History ?Family History  ?Problem Relation Age of Onset  ? Cancer Maternal Uncle   ? Alzheimer's disease Mother   ? Healthy Father   ? Healthy Sister   ? Healthy Sister   ? Healthy Sister   ? Heart disease Brother   ? ? ?Social History ?Social History  ? ?Tobacco Use  ? Smoking status: Former  ?  Types: Cigars  ?  Quit date: 1970  ?  Years since quitting: 53.2  ? Smokeless tobacco: Never  ? Tobacco comments:  ?  smoking cessation materials not required  ?Vaping Use  ? Vaping Use: Never used  ?Substance Use Topics  ? Alcohol use: Not Currently  ?  Alcohol/week: 0.0 standard drinks  ? Drug use: No  ? ? ? ?Allergies   ?Ace inhibitors and Penicillins ? ? ?Review of Systems ?Review of Systems  see HPI ? ? ?Physical Exam ?Triage Vital  Signs ?ED Triage Vitals  ?Enc Vitals Group  ?   BP 06/08/21 1324 124/63  ?   Pulse Rate 06/08/21 1324 86  ?   Resp 06/08/21 1324 18  ?   Temp 06/08/21 1324 97.8 ?F (36.6 ?C)  ?   Temp Source 06/08/21 1324 Oral  ?   SpO2 06/08/21 1324 98 %  ?   Weight 06/08/21 1322 167 lb (75.8 kg)  ?   Height 06/08/21 1322 5\' 11"  (1.803 m)  ?   Pain Score 06/08/21 1322 0  ?   Pain Loc --   ? ? ?Updated Vital Signs ?BP 124/63 (BP Location: Left Arm)   Pulse 86   Temp 97.8 ?F (36.6 ?C) (Oral)   Resp 18   Ht 5\' 11"  (1.803 m)   Wt 75.8 kg   SpO2 98%   BMI 23.29 kg/m?  ? ?Physical Exam ?Constitutional:   ?   General: He is not in acute distress. ?   Appearance: He is not ill-appearing or toxic-appearing.  ?   Comments: Good hygiene, sitting up in push chair ?Hard of hearing and poor  vision per daughter  ?HENT:  ?   Head: Atraumatic.  ?   Mouth/Throat:  ?   Mouth: Mucous membranes are moist.  ?Eyes:  ?   Conjunctiva/sclera:  ?   Right eye: Right conjunctiva is not injected. No exudate. ?   Left eye: Left conjunctiva is not injected. No exudate. ?   Comments: Conjugate gaze observed  ?Cardiovascular:  ?   Rate and Rhythm: Normal rate and regular rhythm.  ?Pulmonary:  ?   Effort: Pulmonary effort is normal. No respiratory distress.  ?   Breath sounds: No wheezing or rhonchi.  ?Abdominal:  ?   General: There is no distension.  ?   Palpations: Abdomen is soft.  ?   Tenderness: There is no abdominal tenderness. There is no guarding or rebound.  ?Musculoskeletal:  ?   Comments: Sitting in push chair  ?Skin: ?   General: Skin is warm and dry.  ?   Comments: Pink, no cyanosis  ?Neurological:  ?   Mental Status: He is alert.  ?   Comments: Face symmetric, speech quiet  ? ? ? ?UC Treatments / Results  ?Labs ?(all labs ordered are listed, but only abnormal results are displayed) ?Labs Reviewed  ?URINALYSIS, ROUTINE W REFLEX MICROSCOPIC - Abnormal; Notable for the following components:  ?    Result Value  ? APPearance CLOUDY (*)   ? Hgb urine dipstick MODERATE (*)   ? Protein, ur TRACE (*)   ? All other components within normal limits  ?URINE CULTURE  ?URINALYSIS, MICROSCOPIC (REFLEX)  ?Micro with >50 RBC, 11-20 WBC, no squames.   ? ?EKG ?NA ? ?Radiology ?No results found. ?NA ? ?Procedures ?Procedures (including critical care time) ?NA ? ?Medications Ordered in UC ?Medications - No data to display ?NA ? ?Final Clinical Impressions(s) / UC Diagnoses  ? ?Final diagnoses:  ?Disorientation  ? ? ? ?Discharge Instructions   ? ?  ?Altered mental status and urinary symptoms are consistent with a urinary tract infection.  Urinalysis at the urgent care today is positive for white blooc cells and red blood cells, consistent with possible urinary tract infection; a culture is pending.  Prescription for cipro  (antibiotic) was sent to the pharmacy.  Cipro can be taken with or without food.  The urgent care will contact you if a change in treatment is needed, based  on culture results.  Push fluids and rest.  Anticipate gradual improvement over the next several days.  Recheck or followup with the primary care provider for worsening confusion, persistent fever >100.5, persistent vomiting, or if just not improving as expected. ? ? ? ?ED Prescriptions   ? ? Medication Sig Dispense Auth. Provider  ? ciprofloxacin (CIPRO) 500 MG tablet Take 1 tablet (500 mg total) by mouth 2 (two) times daily. 20 tablet Wynona Luna, MD  ? ?  ? ?PDMP not reviewed this encounter. ?  ?Wynona Luna, MD ?06/10/21 1047 ? ?

## 2021-06-08 NOTE — ED Triage Notes (Signed)
Pt is acting confused and is hallucinating. Pt is having urinary urgency but is unable to go.  ? ?Pt daughter believes that he has a UTI. ?

## 2021-06-10 LAB — URINE CULTURE: Culture: <10000 — AB

## 2021-06-13 DIAGNOSIS — D649 Anemia, unspecified: Secondary | ICD-10-CM | POA: Diagnosis present

## 2021-07-25 DIAGNOSIS — F03B0 Unspecified dementia, moderate, without behavioral disturbance, psychotic disturbance, mood disturbance, and anxiety: Secondary | ICD-10-CM | POA: Insufficient documentation

## 2021-08-14 ENCOUNTER — Emergency Department: Payer: Medicare Other

## 2021-08-14 ENCOUNTER — Encounter: Payer: Self-pay | Admitting: Emergency Medicine

## 2021-08-14 ENCOUNTER — Inpatient Hospital Stay
Admission: EM | Admit: 2021-08-14 | Discharge: 2021-08-17 | DRG: 948 | Disposition: A | Discharge status: Skilled Nursing Facility | Payer: Medicare Other | Source: Skilled Nursing Facility | Attending: Internal Medicine | Admitting: Internal Medicine

## 2021-08-14 DIAGNOSIS — K219 Gastro-esophageal reflux disease without esophagitis: Secondary | ICD-10-CM | POA: Diagnosis present

## 2021-08-14 DIAGNOSIS — Z8673 Personal history of transient ischemic attack (TIA), and cerebral infarction without residual deficits: Secondary | ICD-10-CM

## 2021-08-14 DIAGNOSIS — Z820 Family history of epilepsy and other diseases of the nervous system: Secondary | ICD-10-CM

## 2021-08-14 DIAGNOSIS — F0390 Unspecified dementia without behavioral disturbance: Secondary | ICD-10-CM | POA: Diagnosis present

## 2021-08-14 DIAGNOSIS — H409 Unspecified glaucoma: Secondary | ICD-10-CM | POA: Diagnosis present

## 2021-08-14 DIAGNOSIS — Z809 Family history of malignant neoplasm, unspecified: Secondary | ICD-10-CM

## 2021-08-14 DIAGNOSIS — I10 Essential (primary) hypertension: Secondary | ICD-10-CM | POA: Diagnosis present

## 2021-08-14 DIAGNOSIS — Z66 Do not resuscitate: Secondary | ICD-10-CM | POA: Diagnosis present

## 2021-08-14 DIAGNOSIS — T43595A Adverse effect of other antipsychotics and neuroleptics, initial encounter: Secondary | ICD-10-CM | POA: Diagnosis present

## 2021-08-14 DIAGNOSIS — R4182 Altered mental status, unspecified: Secondary | ICD-10-CM | POA: Diagnosis not present

## 2021-08-14 DIAGNOSIS — N401 Enlarged prostate with lower urinary tract symptoms: Secondary | ICD-10-CM | POA: Diagnosis present

## 2021-08-14 DIAGNOSIS — R339 Retention of urine, unspecified: Secondary | ICD-10-CM | POA: Diagnosis present

## 2021-08-14 DIAGNOSIS — Z87891 Personal history of nicotine dependence: Secondary | ICD-10-CM

## 2021-08-14 DIAGNOSIS — I4891 Unspecified atrial fibrillation: Secondary | ICD-10-CM | POA: Diagnosis present

## 2021-08-14 DIAGNOSIS — R351 Nocturia: Secondary | ICD-10-CM | POA: Diagnosis present

## 2021-08-14 DIAGNOSIS — Z7901 Long term (current) use of anticoagulants: Secondary | ICD-10-CM

## 2021-08-14 DIAGNOSIS — E785 Hyperlipidemia, unspecified: Secondary | ICD-10-CM | POA: Diagnosis present

## 2021-08-14 DIAGNOSIS — Z8249 Family history of ischemic heart disease and other diseases of the circulatory system: Secondary | ICD-10-CM

## 2021-08-14 DIAGNOSIS — Z79899 Other long term (current) drug therapy: Secondary | ICD-10-CM

## 2021-08-14 DIAGNOSIS — H5316 Psychophysical visual disturbances: Secondary | ICD-10-CM | POA: Diagnosis present

## 2021-08-14 DIAGNOSIS — G9341 Metabolic encephalopathy: Secondary | ICD-10-CM | POA: Diagnosis present

## 2021-08-14 DIAGNOSIS — M109 Gout, unspecified: Secondary | ICD-10-CM | POA: Diagnosis present

## 2021-08-14 LAB — BASIC METABOLIC PANEL
Anion gap: 6 (ref 5–15)
BUN: 18 mg/dL (ref 8–23)
CO2: 25 mmol/L (ref 22–32)
Calcium: 8.6 mg/dL — ABNORMAL LOW (ref 8.9–10.3)
Chloride: 106 mmol/L (ref 98–111)
Creatinine, Ser: 0.82 mg/dL (ref 0.61–1.24)
GFR, Estimated: >60 mL/min (ref >60)
Glucose, Bld: 108 mg/dL — ABNORMAL HIGH (ref 70–99)
Potassium: 4.1 mmol/L (ref 3.5–5.1)
Sodium: 137 mmol/L (ref 135–145)

## 2021-08-14 LAB — URINALYSIS, ROUTINE W REFLEX MICROSCOPIC
Bacteria, UA: NONE SEEN
Bilirubin Urine: NEGATIVE
Glucose, UA: NEGATIVE mg/dL
Ketones, ur: NEGATIVE mg/dL
Leukocytes,Ua: NEGATIVE
Nitrite: NEGATIVE
Protein, ur: 100 mg/dL — AB
RBC / HPF: >50 RBC/hpf — ABNORMAL HIGH (ref 0–5)
Specific Gravity, Urine: 1.019 (ref 1.005–1.030)
Squamous Epithelial / HPF: NONE SEEN (ref 0–5)
pH: 6 (ref 5.0–8.0)

## 2021-08-14 LAB — CBC
HCT: 34.7 % — ABNORMAL LOW (ref 39.0–52.0)
Hemoglobin: 11.5 g/dL — ABNORMAL LOW (ref 13.0–17.0)
MCH: 31.1 pg (ref 26.0–34.0)
MCHC: 33.1 g/dL (ref 30.0–36.0)
MCV: 93.8 fL (ref 80.0–100.0)
Platelets: 291 10*3/uL (ref 150–400)
RBC: 3.7 MIL/uL — ABNORMAL LOW (ref 4.22–5.81)
RDW: 13.2 % (ref 11.5–15.5)
WBC: 5.7 10*3/uL (ref 4.0–10.5)
nRBC: 0 % (ref 0.0–0.2)

## 2021-08-14 LAB — LACTIC ACID, PLASMA: Lactic Acid, Venous: 1.4 mmol/L (ref 0.5–1.9)

## 2021-08-14 MED ORDER — SODIUM CHLORIDE 0.9 % IV BOLUS
1000.0000 mL | Freq: Once | INTRAVENOUS | Status: AC
  Administered 2021-08-14: 1000 mL via INTRAVENOUS

## 2021-08-14 NOTE — ED Triage Notes (Signed)
Pt in via EMS from [facility]. EMS reports pt family want pt evaluated for a UTI. HR 110

## 2021-08-14 NOTE — ED Triage Notes (Signed)
Pt daughter [name] reports you can call her with questions.

## 2021-08-14 NOTE — ED Notes (Signed)
Pt's daughter [name] asking to be notified if the patient is being admitted or discharged.

## 2021-08-14 NOTE — ED Provider Notes (Signed)
[facility] Provider Note    Event Date/Time   First MD Initiated Contact with Patient 08/14/21 2043     (approximate)   History   Urinary Tract Infection   HPI  Erik Larson is a 88 y.o. male  who, per urgent care note dated 06/08/21 was seen for altered mental status and diagnosed with a UTI, who presents to the emergency department today because of concern for altered mental status.  The patient cannot give any significant history as to why he is here.  Apparently the patient's change in mentation occurred today.  Patient denies any headache.  Physical Exam   Triage Vital Signs: ED Triage Vitals  Enc Vitals Group     BP 08/14/21 1714 131/76     Pulse Rate 08/14/21 1714 (!) 104     Resp 08/14/21 1714 20     Temp 08/14/21 1714 97.6 F (36.4 C)     Temp Source 08/14/21 1714 Oral     SpO2 08/14/21 1714 99 %     Weight 08/14/21 1715 180 lb (81.6 kg)     Height 08/14/21 1715 5\' 11"  (1.803 m)     Head Circumference --      Peak Flow --      Pain Score 08/14/21 1711 0   Most recent vital signs: Vitals:   08/14/21 1714 08/14/21 2030  BP: 131/76 (!) 168/95  Pulse: (!) 104 (!) 113  Resp: 20 15  Temp: 97.6 F (36.4 C)   SpO2: 99% 100%    General: Somnolent, easily awoken, alert. Not completely oriented. CV:  Good peripheral perfusion. Tachycardia.  Resp:  Normal effort. Lungs clear. Abd:  No distention.     ED Results / Procedures / Treatments   Labs (all labs ordered are listed, but only abnormal results are displayed) Labs Reviewed  BASIC METABOLIC PANEL - Abnormal; Notable for the following components:      Result Value   Glucose, Bld 108 (*)    Calcium 8.6 (*)    All other components within normal limits  CBC - Abnormal; Notable for the following components:   RBC 3.70 (*)    Hemoglobin 11.5 (*)    HCT 34.7 (*)    All other components within normal limits  URINALYSIS, ROUTINE W REFLEX MICROSCOPIC - Abnormal; Notable for the  following components:   Color, Urine YELLOW (*)    APPearance HAZY (*)    Hgb urine dipstick MODERATE (*)    Protein, ur 100 (*)    RBC / HPF >50 (*)    All other components within normal limits  LACTIC ACID, PLASMA  LACTIC ACID, PLASMA  CBG MONITORING, ED     EKG  I, 10/14/21, attending physician, personally viewed and interpreted this EKG  EKG Time: 1718 Rate: 136 Rhythm: atrial fibrillation Axis: normal Intervals: qtc 526 QRS: narrow, RBBB ST changes: no st elevation Impression: abnormal ekg  RADIOLOGY I independently interpreted and visualized the CXR. My interpretation: No pneumonia. No pneumothorax. Radiology interpretation:  IMPRESSION:  1. No active cardiopulmonary disease.  2.  Aortic Atherosclerosis (ICD10-I70.0).    I independently interpreted and visualized the CT head. My interpretation: No bleed Radiology interpretation:  IMPRESSION:  1. No CT evidence for acute intracranial abnormality.  2. Atrophy and chronic small vessel ischemic changes of the white  matter         PROCEDURES:  Critical Care performed: No  Procedures   MEDICATIONS ORDERED IN ED: Medications -  No data to display   IMPRESSION / MDM / ASSESSMENT AND PLAN / ED COURSE  I reviewed the triage vital signs and the nursing notes.                              Differential diagnosis includes, but is not limited to, UTI, PNA, intracranial bleed, anemia.  Patient's presentation is most consistent with acute presentation with potential threat to life or bodily function.  Patient presents to the emergency department today because of concerns for altered mental status.  On exam patient is awake and alert.  He is not completely oriented.  Daughter was concerned for possible UTI given similar presentation in the past.  Urine here not convincing for urinary tract infection.  Did obtain a CT head which did not show any obvious bleed.  At this time somewhat unclear etiology of the  patient's altered mental status.  Do think patient would benefit from further work-up and management.  Discussed with Dr. Maisie Fus with hospitalist service who will plan admission.  FINAL CLINICAL IMPRESSION(S) / ED DIAGNOSES   Altered mental status   Note:  This document was prepared using Dragon voice recognition software and may include unintentional dictation errors.    Phineas Semen, MD 08/15/21 [phone]

## 2021-08-14 NOTE — ED Notes (Signed)
Pt alert and oriented to self, situation, location but not year or month. Pt stating "hell if I know young lady,"

## 2021-08-14 NOTE — ED Triage Notes (Signed)
Pt via EMS from [facility]. Family wanted pt to checked for a possible UTI, pt does have foley bag in place. Unknown pt's baseline. Pt denies any pain. Pt states he feels weak. Pt is alert and oriented to self. Per pt's paperwork pt does have a hx of dementia.

## 2021-08-14 NOTE — ED Notes (Addendum)
PT daughter stating that "every time pt has a UTI they start talking all crazy and stares off into space." PT normally oriented x4 at baseline. Pt mental status changes this morning when daughter arrived for a visit and patient seemed altered and confused. PT was normal yesterday at approx 1530. Staff at rehab was unaware of patients baseline status and took it for "normal". PT living at [facility].    PT recently d/c for UTI at [facility]. Was dispo to rehab, then had altered mental status and was then readmitted for UTI/Sepsis and sent back to rehab last week.   PT daughter states pt fell out of bed this morning per staff at [facility].  PT daughter worried that pt has increasing stiffness and staring off into space. PT normally walks with cane, but has decreased mobility in rehab and he staff has been worried for falls.

## 2021-08-15 ENCOUNTER — Encounter: Payer: Self-pay | Admitting: Radiology

## 2021-08-15 ENCOUNTER — Inpatient Hospital Stay: Payer: Medicare Other

## 2021-08-15 DIAGNOSIS — E785 Hyperlipidemia, unspecified: Secondary | ICD-10-CM | POA: Diagnosis present

## 2021-08-15 DIAGNOSIS — Z8673 Personal history of transient ischemic attack (TIA), and cerebral infarction without residual deficits: Secondary | ICD-10-CM | POA: Diagnosis not present

## 2021-08-15 DIAGNOSIS — H409 Unspecified glaucoma: Secondary | ICD-10-CM | POA: Diagnosis present

## 2021-08-15 DIAGNOSIS — R4182 Altered mental status, unspecified: Secondary | ICD-10-CM | POA: Diagnosis present

## 2021-08-15 DIAGNOSIS — Z8249 Family history of ischemic heart disease and other diseases of the circulatory system: Secondary | ICD-10-CM | POA: Diagnosis not present

## 2021-08-15 DIAGNOSIS — K219 Gastro-esophageal reflux disease without esophagitis: Secondary | ICD-10-CM | POA: Diagnosis present

## 2021-08-15 DIAGNOSIS — Z809 Family history of malignant neoplasm, unspecified: Secondary | ICD-10-CM | POA: Diagnosis not present

## 2021-08-15 DIAGNOSIS — G9341 Metabolic encephalopathy: Secondary | ICD-10-CM | POA: Diagnosis present

## 2021-08-15 DIAGNOSIS — N400 Enlarged prostate without lower urinary tract symptoms: Secondary | ICD-10-CM | POA: Diagnosis not present

## 2021-08-15 DIAGNOSIS — Z66 Do not resuscitate: Secondary | ICD-10-CM | POA: Diagnosis present

## 2021-08-15 DIAGNOSIS — N401 Enlarged prostate with lower urinary tract symptoms: Secondary | ICD-10-CM | POA: Diagnosis present

## 2021-08-15 DIAGNOSIS — I10 Essential (primary) hypertension: Secondary | ICD-10-CM | POA: Diagnosis present

## 2021-08-15 DIAGNOSIS — F0390 Unspecified dementia without behavioral disturbance: Secondary | ICD-10-CM | POA: Diagnosis present

## 2021-08-15 DIAGNOSIS — Z87891 Personal history of nicotine dependence: Secondary | ICD-10-CM | POA: Diagnosis not present

## 2021-08-15 DIAGNOSIS — T43595A Adverse effect of other antipsychotics and neuroleptics, initial encounter: Secondary | ICD-10-CM | POA: Diagnosis present

## 2021-08-15 DIAGNOSIS — R351 Nocturia: Secondary | ICD-10-CM | POA: Diagnosis present

## 2021-08-15 DIAGNOSIS — M109 Gout, unspecified: Secondary | ICD-10-CM | POA: Diagnosis present

## 2021-08-15 DIAGNOSIS — Z820 Family history of epilepsy and other diseases of the nervous system: Secondary | ICD-10-CM | POA: Diagnosis not present

## 2021-08-15 DIAGNOSIS — H5316 Psychophysical visual disturbances: Secondary | ICD-10-CM | POA: Diagnosis not present

## 2021-08-15 DIAGNOSIS — R339 Retention of urine, unspecified: Secondary | ICD-10-CM | POA: Diagnosis present

## 2021-08-15 DIAGNOSIS — H543 Unqualified visual loss, both eyes: Secondary | ICD-10-CM | POA: Diagnosis not present

## 2021-08-15 DIAGNOSIS — Z7901 Long term (current) use of anticoagulants: Secondary | ICD-10-CM | POA: Diagnosis not present

## 2021-08-15 DIAGNOSIS — I4891 Unspecified atrial fibrillation: Secondary | ICD-10-CM | POA: Diagnosis present

## 2021-08-15 LAB — CBC
HCT: 34.4 % — ABNORMAL LOW (ref 39.0–52.0)
Hemoglobin: 11.2 g/dL — ABNORMAL LOW (ref 13.0–17.0)
MCH: 31.2 pg (ref 26.0–34.0)
MCHC: 32.6 g/dL (ref 30.0–36.0)
MCV: 95.8 fL (ref 80.0–100.0)
Platelets: 248 10*3/uL (ref 150–400)
RBC: 3.59 MIL/uL — ABNORMAL LOW (ref 4.22–5.81)
RDW: 13.4 % (ref 11.5–15.5)
WBC: 5.4 10*3/uL (ref 4.0–10.5)
nRBC: 0.4 % — ABNORMAL HIGH (ref 0.0–0.2)

## 2021-08-15 LAB — COMPREHENSIVE METABOLIC PANEL
ALT: 18 U/L (ref 0–44)
AST: 23 U/L (ref 15–41)
Albumin: 3.4 g/dL — ABNORMAL LOW (ref 3.5–5.0)
Alkaline Phosphatase: 118 U/L (ref 38–126)
Anion gap: 4 — ABNORMAL LOW (ref 5–15)
BUN: 15 mg/dL (ref 8–23)
CO2: 26 mmol/L (ref 22–32)
Calcium: 8.3 mg/dL — ABNORMAL LOW (ref 8.9–10.3)
Chloride: 107 mmol/L (ref 98–111)
Creatinine, Ser: 0.86 mg/dL (ref 0.61–1.24)
GFR, Estimated: >60 mL/min (ref >60)
Glucose, Bld: 102 mg/dL — ABNORMAL HIGH (ref 70–99)
Potassium: 3.8 mmol/L (ref 3.5–5.1)
Sodium: 137 mmol/L (ref 135–145)
Total Bilirubin: 0.6 mg/dL (ref 0.3–1.2)
Total Protein: 7.4 g/dL (ref 6.5–8.1)

## 2021-08-15 LAB — URINE DRUG SCREEN, QUALITATIVE (ARMC ONLY)
Amphetamines, Ur Screen: NOT DETECTED
Barbiturates, Ur Screen: NOT DETECTED
Benzodiazepine, Ur Scrn: NOT DETECTED
Cannabinoid 50 Ng, Ur ~~LOC~~: NOT DETECTED
Cocaine Metabolite,Ur ~~LOC~~: NOT DETECTED
MDMA (Ecstasy)Ur Screen: NOT DETECTED
Methadone Scn, Ur: NOT DETECTED
Opiate, Ur Screen: NOT DETECTED
Phencyclidine (PCP) Ur S: NOT DETECTED
Tricyclic, Ur Screen: NOT DETECTED

## 2021-08-15 LAB — MRSA NEXT GEN BY PCR, NASAL: MRSA by PCR Next Gen: NOT DETECTED

## 2021-08-15 LAB — AMMONIA: Ammonia: 10 umol/L (ref 9–35)

## 2021-08-15 LAB — C-REACTIVE PROTEIN: CRP: 1.4 mg/dL — ABNORMAL HIGH (ref <1.0)

## 2021-08-15 LAB — LACTIC ACID, PLASMA: Lactic Acid, Venous: 1.8 mmol/L (ref 0.5–1.9)

## 2021-08-15 LAB — PROCALCITONIN: Procalcitonin: 4.41 ng/mL

## 2021-08-15 LAB — PROTIME-INR
INR: 1.4 — ABNORMAL HIGH (ref 0.8–1.2)
Prothrombin Time: 16.8 seconds — ABNORMAL HIGH (ref 11.4–15.2)

## 2021-08-15 LAB — VITAMIN B12: Vitamin B-12: 214 pg/mL (ref 180–914)

## 2021-08-15 LAB — TSH: TSH: 2.265 u[IU]/mL (ref 0.350–4.500)

## 2021-08-15 MED ORDER — VANCOMYCIN HCL 2000 MG/400ML IV SOLN
2000.0000 mg | Freq: Once | INTRAVENOUS | Status: AC
Start: 2021-08-15 — End: 2021-08-15
  Administered 2021-08-15: 2000 mg via INTRAVENOUS
  Filled 2021-08-15: qty 400

## 2021-08-15 MED ORDER — SODIUM CHLORIDE 0.9 % IV SOLN
2.0000 g | INTRAVENOUS | Status: DC
  Administered 2021-08-15 – 2021-08-16 (×2): 2 g via INTRAVENOUS
  Filled 2021-08-15 (×2): qty 2

## 2021-08-15 MED ORDER — CIPROFLOXACIN IN D5W 400 MG/200ML IV SOLN
400.0000 mg | Freq: Two times a day (BID) | INTRAVENOUS | Status: DC
  Administered 2021-08-15: 400 mg via INTRAVENOUS
  Filled 2021-08-15: qty 200

## 2021-08-15 MED ORDER — TAMSULOSIN HCL 0.4 MG PO CAPS
0.4000 mg | ORAL_CAPSULE | Freq: Every day | ORAL | Status: DC
  Administered 2021-08-15 – 2021-08-17 (×3): 0.4 mg via ORAL
  Filled 2021-08-15 (×3): qty 1

## 2021-08-15 MED ORDER — RISPERIDONE 0.5 MG PO TABS
0.5000 mg | ORAL_TABLET | Freq: Every day | ORAL | Status: DC
Start: 2021-08-15 — End: 2021-08-15

## 2021-08-15 MED ORDER — WARFARIN SODIUM 3 MG PO TABS: 3.0000 mg | ORAL_TABLET | ORAL | Status: DC

## 2021-08-15 MED ORDER — ONDANSETRON HCL 4 MG/2ML IJ SOLN: 4.0000 mg | Freq: Four times a day (QID) | INTRAMUSCULAR | Status: DC | PRN

## 2021-08-15 MED ORDER — SODIUM CHLORIDE 0.9 % IV SOLN: INTRAVENOUS | Status: AC

## 2021-08-15 MED ORDER — FINASTERIDE 5 MG PO TABS
5.0000 mg | ORAL_TABLET | Freq: Every day | ORAL | Status: DC
  Administered 2021-08-15 – 2021-08-17 (×3): 5 mg via ORAL
  Filled 2021-08-15 (×4): qty 1

## 2021-08-15 MED ORDER — WARFARIN SODIUM 6 MG PO TABS
6.0000 mg | ORAL_TABLET | ORAL | Status: AC
  Administered 2021-08-15: 6 mg via ORAL
  Filled 2021-08-15 (×2): qty 1

## 2021-08-15 MED ORDER — ONDANSETRON HCL 4 MG PO TABS: 4.0000 mg | ORAL_TABLET | Freq: Four times a day (QID) | ORAL | Status: DC | PRN

## 2021-08-15 MED ORDER — ALLOPURINOL 100 MG PO TABS
100.0000 mg | ORAL_TABLET | Freq: Two times a day (BID) | ORAL | Status: DC
  Administered 2021-08-15 – 2021-08-17 (×5): 100 mg via ORAL
  Filled 2021-08-15 (×6): qty 1

## 2021-08-15 MED ORDER — ACETAMINOPHEN 650 MG RE SUPP: 650.0000 mg | Freq: Four times a day (QID) | RECTAL | Status: DC | PRN

## 2021-08-15 MED ORDER — VANCOMYCIN HCL 1500 MG/300ML IV SOLN: 1500.0000 mg | INTRAVENOUS | Status: DC

## 2021-08-15 MED ORDER — SENNA 8.6 MG PO TABS
2.0000 | ORAL_TABLET | Freq: Two times a day (BID) | ORAL | Status: DC
  Filled 2021-08-15 (×3): qty 2

## 2021-08-15 MED ORDER — POLYETHYLENE GLYCOL 3350 17 G PO PACK
17.0000 g | PACK | Freq: Two times a day (BID) | ORAL | Status: DC
  Filled 2021-08-15 (×3): qty 1

## 2021-08-15 MED ORDER — ACETAMINOPHEN 325 MG PO TABS
650.0000 mg | ORAL_TABLET | Freq: Four times a day (QID) | ORAL | Status: DC | PRN
  Administered 2021-08-15 – 2021-08-16 (×3): 650 mg via ORAL
  Filled 2021-08-15 (×3): qty 2

## 2021-08-15 MED ORDER — ALBUTEROL SULFATE (2.5 MG/3ML) 0.083% IN NEBU: 2.5000 mg | INHALATION_SOLUTION | RESPIRATORY_TRACT | Status: DC | PRN

## 2021-08-15 NOTE — ED Notes (Signed)
Warm blanket given at this time 

## 2021-08-15 NOTE — H&P (Addendum)
History and Physical    Erik Larson ZOX:[id number] DOB: 1924-09-30 DOA: 08/14/2021  PCP: Erik GoodellFeldpausch, Erik E, MD  Patient coming from:   I have personally briefly reviewed patient's old medical [facility]  Chief Complaint: Change in mental status  HPI: Erik FailFrank H Larson is a 88 y.o. male with medical history significant for BPH, afib on warfarin, gout,GERD, HLD, glaucoma  dementia,who has interim history of [facility] for sepsis secondary to UTI.  At that time patient was found to have methicillin resistant Staph Epidermidis bacteremia with associated pyelonephritis  for which patient was treated with vancomycin. Patient per daughter had acute change in his mentation today , similar to prior episodes when he has been diagnosed with a UTI. Daughter notes no other symptoms other than change in MS and  patient is poor historian and unable to give history .He however on ROS denies n/v/d/dysuria/focal weakness, vision changes, chest pain or sob. He does note cough x 1 mo but denies fever/chills.   ED Course:  Afeb, bp 168/95, HR115, rr 15, sat 100%   UA: mod hgb, wbc 21-50, rbc >50 Na137, K 4.1, gly 108, cr 0.82 Wbc:5.7, hgb 11.5 at around baseline Cxr: nad CTH: nad   Review of Systems: As per HPI otherwise 10 point review of systems negative.   Past Medical History:  Diagnosis Date   Arthritis    Atrial fibrillation (HCC)    controlled on coumadin   GERD (gastroesophageal reflux disease)    Glaucoma    Gout    Heart disease    Hyperlipidemia     Past Surgical History:  Procedure Laterality Date   COLONOSCOPY  2005 ?   GLAUCOMA SURGERY       reports that he quit smoking about 53 years ago. His smoking use included cigars. He has never used smokeless tobacco. He reports that he does not currently use alcohol. He reports that he does not use drugs.  Allergies  Allergen Reactions   Ace Inhibitors    Penicillins     Family History  Problem  Relation Age of Onset   Cancer Maternal Uncle    Alzheimer's disease Mother    Healthy Father    Healthy Sister    Healthy Sister    Healthy Sister    Heart disease Brother     Prior to Admission medications   Medication Sig Start Date End Date Taking? Authorizing Provider  allopurinol (ZYLOPRIM) 100 MG tablet Take 1 tablet (100 mg total) by mouth 2 (two) times daily. 07/19/17  Yes Duanne LimerickJones, Deanna C, MD  dorzolamide-timolol (COSOPT) 22.3-6.8 MG/ML ophthalmic solution Apply 1 drop to eye as needed. Dr Beverely Paceheek   Yes [provider]  finasteride (PROSCAR) 5 MG tablet Take 1 tablet (5 mg total) by mouth daily. 07/07/17  Yes Duanne LimerickJones, Deanna C, MD  latanoprost (XALATAN) 0.005 % ophthalmic solution Apply 1 drop to eye as needed. Dr Beverely Paceheek   Yes [provider]  melatonin 3 MG TABS tablet Take 3 mg by mouth at bedtime as needed.   Yes [provider]  polyethylene glycol (MIRALAX / GLYCOLAX) 17 g packet Take 17 g by mouth 2 (two) times daily. 08/05/21 09/04/21 Yes [provider]  risperiDONE (RISPERDAL) 0.5 MG tablet Take 0.5 mg by mouth at bedtime. 08/05/21  Yes [provider]  senna (SENOKOT) 8.6 MG tablet Take 2 tablets by mouth 2 (two) times daily. 08/05/21 09/04/21 Yes [provider]  tamsulosin [facility](FLOMAX)  0.4 MG CAPS capsule Take 0.4 mg by mouth daily. 08/05/21  Yes [provider]  warfarin (COUMADIN) 6 MG tablet TAKE 1 TABLET DAILY Patient taking differently: Take 3-6 mg by mouth as directed. Take 3 mg on Wednesday and Saturday Take 6 mg on Sunday, [place], Thursday, and Friday 12/20/17  Yes Duanne Limerick, MD    Physical Exam: Vitals:   08/14/21 2240 08/14/21 2300 08/15/21 0000 08/15/21 0100  BP: 137/62 124/70 112/61 123/68  Pulse: 99 82 78 71  Resp: 19 17 16 19   Temp:      TempSrc:      SpO2: 97% 98% 97% 96%  Weight:      Height:         Vitals:   08/14/21 2240 08/14/21 2300 08/15/21 0000 08/15/21 0100  BP: 137/62  124/70 112/61 123/68  Pulse: 99 82 78 71  Resp: 19 17 16 19   Temp:      TempSrc:      SpO2: 97% 98% 97% 96%  Weight:      Height:      Constitutional: NAD, calm, comfortable Eyes: PERRL, lids and conjunctivae normal ENMT: Mucous membranes are dry. Posterior pharynx clear of any exudate or lesions. Poor dentition.  Neck: normal, supple, no masses, no thyromegaly Respiratory: clear to auscultation bilaterally, no wheezing, no crackles. Normal respiratory effort. No accessory muscle use.  Cardiovascular: iregular rate and rhythm, no murmurs / rubs / gallops. No extremity edema. 2+ pedal pulses. No carotid bruits.  Abdomen: no tenderness, no masses palpated. No hepatosplenomegaly. Bowel sounds positive.  Musculoskeletal: no clubbing / cyanosis. No joint deformity upper and lower extremities. Good ROM, no contractures. Normal muscle tone.  Skin: no rashes, lesions, ulcers. No induration Neurologic: CN 2-12 grossly intact. Sensation intact,   Strength 5/5 in all 4.  Psychiatric: alert and oriented to self, know he is an hospital does not know year,pleaseant  Labs on Admission: I have personally reviewed following labs and imaging studies  CBC: Recent Labs  Lab 08/14/21 1716  WBC 5.7  HGB 11.5*  HCT 34.7*  MCV 93.8  PLT 291   Basic Metabolic Panel: Recent Labs  Lab 08/14/21 1716  NA 137  K 4.1  CL 106  CO2 25  GLUCOSE 108*  BUN 18  CREATININE 0.82  CALCIUM 8.6*   GFR: Estimated Creatinine Clearance: 58.7 mL/min (by C-G formula based on SCr of 0.82 mg/dL). Liver Function Tests: No results for input(s): AST, ALT, ALKPHOS, BILITOT, PROT, ALBUMIN in the last 168 hours. No results for input(s): LIPASE, AMYLASE in the last 168 hours. No results for input(s): AMMONIA in the last 168 hours. Coagulation Profile: No results for input(s): INR, PROTIME in the last 168 hours. Cardiac Enzymes: No results for input(s): CKTOTAL, CKMB, CKMBINDEX, TROPONINI in the last 168 hours. BNP  (last 3 results) No results for input(s): PROBNP in the last 8760 hours. HbA1C: No results for input(s): HGBA1C in the last 72 hours. CBG: No results for input(s): GLUCAP in the last 168 hours. Lipid Profile: No results for input(s): CHOL, HDL, LDLCALC, TRIG, CHOLHDL, LDLDIRECT in the last 72 hours. Thyroid Function Tests: No results for input(s): TSH, T4TOTAL, FREET4, T3FREE, THYROIDAB in the last 72 hours. Anemia Panel: No results for input(s): VITAMINB12, FOLATE, FERRITIN, TIBC, IRON, RETICCTPCT in the last 72 hours. Urine analysis:    Component Value Date/Time   COLORURINE YELLOW (A) 08/14/2021 2017   APPEARANCEUR HAZY (A) 08/14/2021 2017   LABSPEC 1.019 08/14/2021 2017  PHURINE 6.0 08/14/2021 2017   GLUCOSEU NEGATIVE 08/14/2021 2017   HGBUR MODERATE (A) 08/14/2021 2017   BILIRUBINUR NEGATIVE 08/14/2021 2017   KETONESUR NEGATIVE 08/14/2021 2017   PROTEINUR 100 (A) 08/14/2021 2017   NITRITE NEGATIVE 08/14/2021 2017   LEUKOCYTESUR NEGATIVE 08/14/2021 2017    Radiological Exams on Admission: DG Chest 2 View  Result Date: 08/14/2021 CLINICAL DATA:  AMS EXAM: CHEST - 2 VIEW COMPARISON:  Chest x-ray 11/12/2020 FINDINGS: The heart and mediastinal contours are unchanged. Aortic calcification. No focal consolidation. No pulmonary edema. No pleural effusion. No pneumothorax. No acute osseous abnormality. IMPRESSION: 1. No active cardiopulmonary disease. 2.  Aortic Atherosclerosis (ICD10-I70.0). Electronically Signed   By: Tish Frederickson M.D.   On: 08/14/2021 21:37   CT Head Wo Contrast  Result Date: 08/14/2021 CLINICAL DATA:  Altered mental status EXAM: CT HEAD WITHOUT CONTRAST TECHNIQUE: Contiguous axial images were obtained from the base of the skull through the vertex without intravenous contrast. RADIATION DOSE REDUCTION: This exam was performed according to the departmental dose-optimization program which includes automated exposure control, adjustment of the mA and/or kV according  to patient size and/or use of iterative reconstruction technique. COMPARISON:  CT brain 11/12/2020 FINDINGS: Brain: No acute territorial infarction, hemorrhage or intracranial mass. Atrophy and mild chronic small vessel ischemic changes of the white matter. Stable ventricle size. Small chronic appearing left basal ganglial infarct but new compared to prior CT. Vascular: No hyperdense vessels.  Carotid vascular calcification Skull: Normal. Negative for fracture or focal lesion. Sinuses/Orbits: No acute finding. Other: None IMPRESSION: 1. No CT evidence for acute intracranial abnormality. 2. Atrophy and chronic small vessel ischemic changes of the white matter Electronically Signed   By: Jasmine Pang M.D.   On: 08/14/2021 22:38    EKG: Independently reviewed.  afib/rbbb  Assessment/Plan  Change in mental status  -r/o neuro pathology  -CTH , negative -to be complete f/u with MRI  -cxr negative  -possible due to UTI will start abx to patient's prior hx  - possible metabolic encephalopathy, ammonia, vbg pending  -monitor on neuro checks  -further cva w/o if MRI abn   ? UTI -+blood, with wbc not impressive -but due to patient recent hx of pyelonephritis /bacteremia -will start broad spectrum abx  -patient had methicillin resistant Staph Epidermidis  -vanc/ctx for now and de-escalate as able  -follow inflammatory markers    BPH -monitor uop  -bladder scan ordered     Afib -continue  on warfarin   Gout  -no active issue   GERD -ppi   HLD -resume home regimen    Glaucoma -resume eye drops     DVT prophylaxis: warfarin Code Status: DNI/DNR Family Communication:  Disposition Plan: patient  expected to be admitted greater than 2 midnights  Consults called: n/a Admission status: med tele   Lurline Del MD [facility]   If 7PM-7AM, please contact night-coverage [email or web address] Password [email or web address]  08/15/2021, 1:18 AM

## 2021-08-15 NOTE — ED Notes (Signed)
Pt returned from CT at this time.  

## 2021-08-15 NOTE — ED Notes (Signed)
Pt taken to CT at this time.

## 2021-08-15 NOTE — ED Notes (Signed)
Pt repeatedly attempting to get out of bed, john, ed tech in to sit with pt.

## 2021-08-15 NOTE — ED Notes (Signed)
Cleansed pt of stool, repositioned in bed with megan, rn. Fall pad in place.

## 2021-08-15 NOTE — Progress Notes (Signed)
This patient is a 87 yr old man who recently was hospitalized with AMS due to UTI/pyelonephritis due to MRSE. He was discharged on 08/05/2021. He was brought to the ED on 08/14/2021 due to family's concerns about the patient's AMS. He was admitted out of concern that the patient again was infected with UTI. He was given IV UA is negative. The patient has no elevation of his WBC count. CT head demonstrated no evidence for acute intracranial abnormality. There is atrophy and chronic small vessel ischemic changes of the white matter. 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 There are bilateral mastoid effusions.  The patient was admitted to the hospital early this morning by my colleague Meredith Staggers MD.  On physical exam the patient is lethargic, but rouseable. His daughter is at bedside. No physical signs of mastoiditis. Heart and lung exam are within normal limits. Abdomen is soft, non-tender, non-distended. He is moving extremities.   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  I have reviewed the patient's vitals and labwork as well as his imaging results.  Will attempt to discuss this patient with his neurologist tomorrow. As the patient is currently so lethargic, I will hold his bid resperdal.

## 2021-08-15 NOTE — Progress Notes (Signed)
Pharmacy Antibiotic Note  Erik Larson is a 88 y.o. male admitted on 08/14/2021 with "hx of methicillin resistant staph epi thought to be from urinary source."  Pharmacy has been consulted for Vancomycin dosing.  Plan: Pt ordered initial dose of Vancomycin 2 gm Vancomycin 1500 mg IV Q 24 hrs.  Goal AUC 400-550. Expected AUC: 480.9 SCr used: 0.82  Pharmacy will continue to follow and will adjust abx dosing whenever warranted.   Height: 5\' 11"  (180.3 cm) Weight: 81.6 kg (180 lb) IBW/kg (Calculated) : 75.3  Temp (24hrs), Avg:97.6 F (36.4 C), Min:97.6 F (36.4 C), Max:97.6 F (36.4 C)  Recent Labs  Lab 08/14/21 1716 08/14/21 1840 08/15/21 0432  WBC 5.7  --  5.4  CREATININE 0.82  --   --   LATICACIDVEN  --  1.4  --     Estimated Creatinine Clearance: 58.7 mL/min (by C-G formula based on SCr of 0.82 mg/dL).    Allergies  Allergen Reactions   Ace Inhibitors    Penicillins     Antimicrobials this admission: 6/04 Cipro >>  6/04 Vancomycin >>   Microbiology results: 6/04 BCx: Pending 6/03 UCx: Pending   Thank you for allowing pharmacy to be a part of this patient's care. 8/03, PharmD, [facility] 08/15/2021 5:33 AM

## 2021-08-16 DIAGNOSIS — I482 Chronic atrial fibrillation, unspecified: Secondary | ICD-10-CM

## 2021-08-16 DIAGNOSIS — N400 Enlarged prostate without lower urinary tract symptoms: Secondary | ICD-10-CM | POA: Diagnosis not present

## 2021-08-16 DIAGNOSIS — H5316 Psychophysical visual disturbances: Secondary | ICD-10-CM | POA: Diagnosis not present

## 2021-08-16 DIAGNOSIS — R4182 Altered mental status, unspecified: Secondary | ICD-10-CM | POA: Diagnosis not present

## 2021-08-16 DIAGNOSIS — H543 Unqualified visual loss, both eyes: Secondary | ICD-10-CM | POA: Diagnosis not present

## 2021-08-16 DIAGNOSIS — Z7901 Long term (current) use of anticoagulants: Secondary | ICD-10-CM

## 2021-08-16 LAB — CBC WITH DIFFERENTIAL/PLATELET
Abs Immature Granulocytes: 0.03 10*3/uL (ref 0.00–0.07)
Basophils Absolute: 0.1 10*3/uL (ref 0.0–0.1)
Basophils Relative: 1 %
Eosinophils Absolute: 0.4 10*3/uL (ref 0.0–0.5)
Eosinophils Relative: 6 %
HCT: 29.3 % — ABNORMAL LOW (ref 39.0–52.0)
Hemoglobin: 9.7 g/dL — ABNORMAL LOW (ref 13.0–17.0)
Immature Granulocytes: 1 %
Lymphocytes Relative: 21 %
Lymphs Abs: 1.3 10*3/uL (ref 0.7–4.0)
MCH: 31.3 pg (ref 26.0–34.0)
MCHC: 33.1 g/dL (ref 30.0–36.0)
MCV: 94.5 fL (ref 80.0–100.0)
Monocytes Absolute: 0.9 10*3/uL (ref 0.1–1.0)
Monocytes Relative: 15 %
Neutro Abs: 3.3 10*3/uL (ref 1.7–7.7)
Neutrophils Relative %: 56 %
Platelets: 202 10*3/uL (ref 150–400)
RBC: 3.1 MIL/uL — ABNORMAL LOW (ref 4.22–5.81)
RDW: 13.4 % (ref 11.5–15.5)
WBC: 5.9 10*3/uL (ref 4.0–10.5)
nRBC: 0 % (ref 0.0–0.2)

## 2021-08-16 LAB — URINE CULTURE: Culture: NO GROWTH

## 2021-08-16 LAB — PROTIME-INR
INR: 1.4 — ABNORMAL HIGH (ref 0.8–1.2)
Prothrombin Time: 17 seconds — ABNORMAL HIGH (ref 11.4–15.2)

## 2021-08-16 LAB — BASIC METABOLIC PANEL
Anion gap: 5 (ref 5–15)
BUN: 16 mg/dL (ref 8–23)
CO2: 26 mmol/L (ref 22–32)
Calcium: 8.1 mg/dL — ABNORMAL LOW (ref 8.9–10.3)
Chloride: 108 mmol/L (ref 98–111)
Creatinine, Ser: 0.83 mg/dL (ref 0.61–1.24)
GFR, Estimated: >60 mL/min (ref >60)
Glucose, Bld: 94 mg/dL (ref 70–99)
Potassium: 3.6 mmol/L (ref 3.5–5.1)
Sodium: 139 mmol/L (ref 135–145)

## 2021-08-16 LAB — PROCALCITONIN: Procalcitonin: 4.75 ng/mL

## 2021-08-16 MED ORDER — WARFARIN SODIUM 6 MG PO TABS
6.0000 mg | ORAL_TABLET | ORAL | Status: DC
  Filled 2021-08-16: qty 1

## 2021-08-16 MED ORDER — MELATONIN 5 MG PO TABS
5.0000 mg | ORAL_TABLET | Freq: Once | ORAL | Status: AC
  Administered 2021-08-16: 5 mg via ORAL
  Filled 2021-08-16: qty 1

## 2021-08-16 MED ORDER — WARFARIN SODIUM 4 MG PO TABS
8.0000 mg | ORAL_TABLET | Freq: Once | ORAL | Status: AC
  Administered 2021-08-16: 8 mg via ORAL
  Filled 2021-08-16: qty 2

## 2021-08-16 MED ORDER — WARFARIN SODIUM 3 MG PO TABS: 3.0000 mg | ORAL_TABLET | ORAL | Status: DC

## 2021-08-16 MED ORDER — WARFARIN - PHARMACIST DOSING INPATIENT: Freq: Every day | Status: DC

## 2021-08-16 NOTE — TOC Initial Note (Signed)
Transition of Care [facility]) - Initial/Assessment Note    Patient Details  Name: Erik Larson MRN: [id number] Date of Birth: 26-Dec-1924  Transition of Care [facility]) CM/SW Contact:    Eileen Stanford, LCSW Phone Number: 08/16/2021, 4:10 PM  Clinical Narrative:     CSW spoke with pt's daughter [name] and she confirmed pt is from [facility] and will return at d/c. [name] is asking about transport back to SNF. CSW explained we would set up EMS transport. [name] just ask that she be contacted once it has been arranged--CSW will contact [name] once dc is done and EMS is arranged (potentially dc tomorrow, per MD).              Expected Discharge Plan: Skilled Nursing Facility Barriers to Discharge: Continued Medical Work up   Patient Goals and CMS Choice Patient states their goals for this hospitalization and ongoing recovery are:: to return to [facility]   Choice offered to / list presented to : Adult Children  Expected Discharge Plan and Services Expected Discharge Plan: [place] In-house Referral: NA   Post Acute Care Choice: [place] Living arrangements for the past 2 months: [place]                                      Prior Living Arrangements/Services Living arrangements for the past 2 months: [place]   Patient language and need for interpreter reviewed:: Yes Do you feel safe going back to the place where you live?: Yes      Need for Family Participation in Patient Care: Yes (Comment) Care giver support system in place?: Yes (comment)   Criminal Activity/Legal Involvement Pertinent to Current Situation/Hospitalization: No - Comment as needed  Activities of Daily Living      Permission Sought/Granted Permission sought to share information with : Family Supports Permission granted to share information with : Yes, Release of Information Signed  Share Information with NAME: [name]  Permission granted to share info  w AGENCY: [place] granted to share info w Relationship: daughter     Emotional Assessment Appearance:: Appears stated age Attitude/Demeanor/Rapport: Unable to Assess Affect (typically observed): Unable to Assess Orientation: : Oriented to Self, Oriented to Place Alcohol / Substance Use: Not Applicable Psych Involvement: No (comment)  Admission diagnosis:  Change in mental status [R41.82] Altered mental status, unspecified altered mental status type [R41.82] Patient Active Problem List   Diagnosis Date Noted   Change in mental status 08/15/2021   Hyperuricemia 10/09/2015   BPH associated with nocturia 10/09/2015   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/29/2014   Interval gout 07/29/2014   Diverticulitis of colon 07/29/2014   Long term current use of anticoagulant therapy 07/29/2014   Atrial fibrillation, controlled ([place]) 07/29/2014   Acid reflux 07/29/2014   Warfarin therapy started 07/29/2014   PCP:  Sofie Hartigan, MD Pharmacy:   [facility] [place], [place] [address] [place] [address] Phone: [phone] Fax: [phone]  [facility] [place], [place] [address] [place] [address] Phone: [phone] Fax: [phone]  [id number] [place], [place] [address] Phone: [phone] Fax: [phone]     Social Determinants of  Health (SDOH) Interventions    Readmission Risk Interventions     View : No data to display.

## 2021-08-16 NOTE — Plan of Care (Signed)

## 2021-08-16 NOTE — Consult Note (Signed)
ANTICOAGULATION CONSULT NOTE   Pharmacy Consult for warfarin Indication: atrial fibrillation  Allergies  Allergen Reactions   Ace Inhibitors    Penicillins     Patient Measurements: Height: 5\' 11"  (180.3 cm) Weight: 81.6 kg (180 lb) IBW/kg (Calculated) : 75.3  Vital Signs: Temp: 97.9 F (36.6 C) (06/05 1111) BP: 124/76 (06/05 1111) Pulse Rate: 91 (06/05 1111)  Labs: Recent Labs    08/14/21 1716 08/15/21 0432 08/16/21 0624 08/16/21 1422  HGB 11.5* 11.2* 9.7*  --   HCT 34.7* 34.4* 29.3*  --   PLT 291 248 202  --   LABPROT  --  16.8*  --  17.0*  INR  --  1.4*  --  1.4*  CREATININE 0.82 0.86 0.83  --     Estimated Creatinine Clearance: 58 mL/min (by C-G formula based on SCr of 0.83 mg/dL).   Medical History: Past Medical History:  Diagnosis Date   Arthritis    Atrial fibrillation ([place])    controlled on coumadin   GERD (gastroesophageal reflux disease)    Glaucoma    Gout    Heart disease    Hyperlipidemia     Medications:  Warfarin 3 mg on Wednesday and Saturday, 6 mg all other days Last dose PTA: 6/3 @ 0543   Assessment: 88 y.o. male with PMH of afib on warfarin, HLD and gout who was recently hospitalized with AMS due to UTI/pyelonephritis due to MRSE presenting to the ED on 6/5 with concerns for AMS. CT and MRI stable with not acute abnormalities. Pharmacy has been consulted for warfarin dosing.   DDIs:  Warfarin + ceftriaxone >> may increase INR  Date INR Warfarin dose 6/4 1.4 6 mg 6/5 1.4 8 mg   Goal of Therapy:  INR 2-3 Monitor platelets by anticoagulation protocol: Yes   Plan:  INR 1.4, subtherapeutic Will give warfarin 8 mg x 1 tonight Monitor daily INR, CBC, s/s of bleed   Darnelle Bos, PharmD 08/16/2021,4:10 PM

## 2021-08-16 NOTE — NC FL2 (Signed)
[place] LEVEL OF CARE SCREENING TOOL     IDENTIFICATION  Patient Name: Erik Larson Birthdate: October 23, 1924 Sex: male Admission Date (Current Location): 08/14/2021  [facility] and [place] Number:  Engineering geologist and Address:  [facility], [address], [place], [place] [address]      Provider Number: [id number]  Attending Physician Name and Address:  Karie Kirks, DO  Relative Name and Phone Number:       Current Level of Care: Hospital Recommended Level of Care: [place] Prior Approval Number:    Date Approved/Denied:   PASRR Number:    Discharge Plan: SNF    Current Diagnoses: Patient Active Problem List   Diagnosis Date Noted   Change in mental status 08/15/2021   Hyperuricemia 10/09/2015   BPH associated with nocturia 10/09/2015   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/29/2014   Interval gout 07/29/2014   Diverticulitis of colon 07/29/2014   Long term current use of anticoagulant therapy 07/29/2014   Atrial fibrillation, controlled ([place]) 07/29/2014   Acid reflux 07/29/2014   Warfarin therapy started 07/29/2014    Orientation RESPIRATION BLADDER Height & Weight     Self, Place  Normal Continent Weight: 180 lb (81.6 kg) Height:  5\' 11"  (180.3 cm)  BEHAVIORAL SYMPTOMS/MOOD NEUROLOGICAL BOWEL NUTRITION STATUS      Incontinent Diet (carb modified, thin liquids)  AMBULATORY STATUS COMMUNICATION OF NEEDS Skin   Limited Assist Verbally Normal                       Personal Care Assistance Level of Assistance  Bathing, Feeding, Dressing Bathing Assistance: Limited assistance Feeding assistance: Independent Dressing Assistance: Limited assistance     Functional Limitations Info  Sight, Hearing, Speech Sight Info: Adequate Hearing Info: Adequate Speech Info: Adequate    SPECIAL CARE FACTORS FREQUENCY  PT (By licensed PT), OT (By licensed OT)     PT Frequency: 5x OT  Frequency: 5x            Contractures Contractures Info: Not present    Additional Factors Info  Code Status, Allergies Code Status Info: DNR Allergies Info: Ace Inhibitors, Penicillins           Current Medications (08/16/2021):  This is the current hospital active medication list Current Facility-Administered Medications  Medication Dose Route Frequency Provider Last Rate Last Admin   acetaminophen (TYLENOL) tablet 650 mg  650 mg Oral Q6H PRN Clance Boll, MD   650 mg at 08/15/21 2304   Or   acetaminophen (TYLENOL) suppository 650 mg  650 mg Rectal Q6H PRN Clance Boll, MD       albuterol (PROVENTIL) (2.5 MG/3ML) 0.083% nebulizer solution 2.5 mg  2.5 mg Nebulization Q2H PRN Clance Boll, MD       allopurinol (ZYLOPRIM) tablet 100 mg  100 mg Oral BID Myles Rosenthal A, MD   100 mg at 08/16/21 0933   cefTRIAXone (ROCEPHIN) 2 g in sodium chloride 0.9 % 100 mL IVPB  2 g Intravenous Q24H Swayze, Ava, DO   Stopped at 08/15/21 1910   finasteride (PROSCAR) tablet 5 mg  5 mg Oral Daily Myles Rosenthal A, MD   5 mg at 08/16/21 0933   ondansetron (ZOFRAN) tablet 4 mg  4 mg Oral Q6H PRN Clance Boll, MD       Or   ondansetron [facility]) injection 4 mg  4 mg Intravenous Q6H PRN Clance Boll, MD  polyethylene glycol (MIRALAX / GLYCOLAX) packet 17 g  17 g Oral BID Clance Boll, MD       senna (SENOKOT) tablet 17.2 mg  2 tablet Oral BID Myles Rosenthal A, MD       tamsulosin [facility]) capsule 0.4 mg  0.4 mg Oral Daily Myles Rosenthal A, MD   0.4 mg at 08/16/21 0933   [START ON 08/18/2021] warfarin (COUMADIN) tablet 3 mg  3 mg Oral Once per day on Wed Sat Renda Rolls, [facility]       warfarin (COUMADIN) tablet 6 mg  6 mg Oral Once per day on Sun Mon Tue Thu Fri Renda Rolls, [facility]         Discharge Medications: Please see discharge summary for a list of discharge medications.  Relevant Imaging Results:  Relevant Lab Results:   Additional  Information [id number]  Gerrianne Scale Alesia Oshields, LCSW

## 2021-08-16 NOTE — Progress Notes (Addendum)
PROGRESS NOTE  Erik Larson BMW:[id number] DOB: Apr 16, 1924 DOA: 08/14/2021 PCP: Marina Goodell, MD  Brief History   This patient is a 88 yr old man who recently was hospitalized with AMS due to UTI/pyelonephritis due to MRSE. He was discharged on 08/05/2021. He was brought to the ED on 08/14/2021 due to family's concerns about the patient's AMS. He was admitted out of concern that the patient again was infected with UTI. He was given IV UA is negative. The patient has no elevation of his WBC count. CT head demonstrated no evidence for acute intracranial abnormality. There is atrophy and chronic small vessel ischemic changes of the white matter. 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 There are bilateral mastoid effusions.  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  The patient's UA was negative for UTI. He has no fevers and no elevation of WBC.   The patient was quite lethargic on my visit on 08/15/2021. He was receiving Risperdal 0.5 bid. This was held due to his lethargy.  This morning the patient is awake, alert, and conversant. I have discussed him with his neurologist Dr. Malvin Johns. His recommendation was to continue to hold the risperdal and follow up with him as outpatient.  No infectious cause for the patient's change in behavior has been found.  He will be evaluated by PT/OT today.  Consultants  None  Procedures  None  Antibiotics  Rocephin  Subjective  The patient is resting comfortably. He is awake and alert. No new complaints.  Objective   Vitals:  Vitals:   08/16/21 0744 08/16/21 1111  BP: 121/74 124/76  Pulse: 98 91  Resp: 18 18  Temp: 97.8 F (36.6 C) 97.9 F  (36.6 C)  SpO2: 99% 99%    Exam:  Constitutional:  The patient is awake, alert, and oriented x 3. No acute distress. Respiratory:  No increased work of breathing. No wheezes, rales, or rhonchi No tactile fremitus Cardiovascular:  Regular rate and rhythm No murmurs, ectopy, or gallups. No lateral PMI. No thrills. Abdomen:  Abdomen is soft, non-tender, non-distended No hernias, masses, or organomegaly Normoactive bowel sounds.  Musculoskeletal:  No cyanosis, clubbing, or edema Skin:  No rashes, lesions, ulcers palpation of skin: no induration or nodules Neurologic:  CN 2-12 intact Sensation all 4 extremities intact Psychiatric:  Mental status Mood, affect appropriate Orientation to person, place, time  judgment and insight appear intact  I have personally reviewed the following:   Today's Data  Vitals  Lab Data  BMP CBC UA  Micro Data  Blood culture x 2: No growth  Imaging  CT head  Cardiology Data  EKG  Other Data    Scheduled Meds:  allopurinol  100 mg Oral BID   finasteride  5 mg Oral Daily   polyethylene glycol  17 g Oral BID   senna  2 tablet Oral BID   tamsulosin  0.4 mg Oral Daily   [START ON 08/18/2021] warfarin  3 mg Oral Once per day on Wed Sat   warfarin  6 mg Oral Once per day on Sun Mon Tue Thu Fri   Continuous Infusions:  cefTRIAXone (ROCEPHIN)  IV Stopped (08/15/21 1910)    Principal Problem:   Change in mental status  LOS: 1 day   A & P   Altered Mental Status No putative source for infectious seen. Blood culture x 2: no growth Appears to be resolved. Risperidal held os of 08/15/2021 due to lethargy. The patient was discussed with his neurologist today. He questions if the increased dose of Risperdal could be the cause for the mental status changes. He recommends holding it and follow up with neurology on discharge.   UTI No evidence for UTI  Urinary Retention: The patient is having good Urine output so far. Will have  nursing do PVR. He has been started on Flomax and has an appointment with urology as outpatient.   BPH Will check PVR.   Afib Rate is controlled Continue  on warfarin. Pharmacy has been consulted to manage warfarin.   Gout  no active issue    GERD ppi    HLD resume home regimen    Blindness/Glaucoma resume eye drops     I have seen and examined this patient myself. I have spent 46 minutes in this patient's evaluation and care today.   DVT prophylaxis: warfarin Code Status: DNI/DNR Family Communication: Discussed patient with daughter, [name]. All questions answered to the best of my ability. Disposition Plan: patient  expected to be admitted greater than 2 midnights   Abeeha Twist, DO [facility] Direct contact: see [email or web address]  7PM-7AM contact night coverage as above 08/16/2021, 3:51 PM  LOS: 1 day

## 2021-08-17 DIAGNOSIS — N401 Enlarged prostate with lower urinary tract symptoms: Secondary | ICD-10-CM

## 2021-08-17 DIAGNOSIS — R351 Nocturia: Secondary | ICD-10-CM

## 2021-08-17 DIAGNOSIS — Z79899 Other long term (current) drug therapy: Secondary | ICD-10-CM

## 2021-08-17 DIAGNOSIS — I4891 Unspecified atrial fibrillation: Secondary | ICD-10-CM | POA: Diagnosis not present

## 2021-08-17 DIAGNOSIS — R4182 Altered mental status, unspecified: Secondary | ICD-10-CM | POA: Diagnosis not present

## 2021-08-17 DIAGNOSIS — H5316 Psychophysical visual disturbances: Secondary | ICD-10-CM | POA: Diagnosis not present

## 2021-08-17 LAB — PROTIME-INR
INR: 1.3 — ABNORMAL HIGH (ref 0.8–1.2)
Prothrombin Time: 16.5 seconds — ABNORMAL HIGH (ref 11.4–15.2)

## 2021-08-17 LAB — PROCALCITONIN: Procalcitonin: 4.17 ng/mL

## 2021-08-17 MED ORDER — WARFARIN SODIUM 6 MG PO TABS
6.0000 mg | ORAL_TABLET | Freq: Every day | ORAL | Status: DC
Start: 2021-08-17 — End: 2021-08-17
  Filled 2021-08-17: qty 1

## 2021-08-17 NOTE — Evaluation (Signed)
Occupational Therapy Evaluation Patient Details Name: Erik Larson MRN: [id number] DOB: [birth year] Today's Date: 08/17/2021   History of Present Illness Pt is a 88 y.o. male with medical history significant for BPH, afib on warfarin, gout,GERD, HLD, glaucoma  dementia,who has interim history of [facility] for sepsis secondary to UTI.  At that time patient was found to have methicillin resistant Staph Epidermidis bacteremia with associated pyelonephritis for which patient was treated with vancomycin. Patient per daughter had acute change in his mentation, similar to prior episodes when he has been diagnosed with a UTI. Daughter notes no other symptoms other than change in MS and patient is poor historian and unable to give history. Daughter also stated pt is blind.   Clinical Impression   Patient presenting with decreased Ind in self care, balance, functional mobility/transfers, endurance, and safety awareness. Patient with PMH of dementia and no family or staff present to confirm baseline. Pt is blind and reports seeing only darkness. He reports use of RW and ability to perform all self care and mobility himself with occasional assist from "the girls". He is very pleasant and motivated this session.  Patient currently functioning at min A for self care tasks and mobility with use of RW. Patient will benefit from acute OT to increase overall independence in the areas of ADLs, functional mobility, and safety awareness in order to safely discharge to next venue of care.      Recommendations for follow up therapy are one component of a multi-disciplinary discharge planning process, led by the attending physician.  Recommendations may be updated based on patient status, additional functional criteria and insurance authorization.   Follow Up Recommendations  Skilled nursing-short term rehab (<3 hours/day)    Assistance Recommended at Discharge Frequent or constant Supervision/Assistance   Patient can return home with the following A little help with walking and/or transfers;A little help with bathing/dressing/bathroom;Assistance with feeding;Direct supervision/assist for financial management;Direct supervision/assist for medications management    Functional Status Assessment  Patient has had a recent decline in their functional status and demonstrates the ability to make significant improvements in function in a reasonable and predictable amount of time.  Equipment Recommendations  Other (comment) (defer to next venue of care)       Precautions / Restrictions Precautions Precautions: Fall Precaution Comments: blind Restrictions Weight Bearing Restrictions: No      Mobility Bed Mobility               General bed mobility comments: Pt seated in recliner chair    Transfers Overall transfer level: Needs assistance Equipment used: Rolling walker (2 wheels) Transfers: Sit to/from Stand, Bed to chair/wheelchair/BSC Sit to Stand: Supervision     Step pivot transfers: Min assist            Balance Overall balance assessment: Needs assistance Sitting-balance support: No upper extremity supported, Feet supported Sitting balance-Leahy Scale: Good     Standing balance support: Bilateral upper extremity supported Standing balance-Leahy Scale: Fair                             ADL either performed or assessed with clinical judgement   ADL Overall ADL's : Needs assistance/impaired                                       General ADL Comments: supervision/set  up - min A for self care tasks and functional mobility with use of RW     Vision Baseline Vision/History: 2 Legally blind              Pertinent Vitals/Pain Pain Assessment Pain Assessment: No/denies pain     Hand Dominance Right   Extremity/Trunk Assessment Upper Extremity Assessment Upper Extremity Assessment: Overall WFL for tasks assessed;Generalized  weakness   Lower Extremity Assessment Lower Extremity Assessment: Defer to PT evaluation       Communication Communication Communication: No difficulties   Cognition Arousal/Alertness: Awake/alert Behavior During Therapy: WFL for tasks assessed/performed Overall Cognitive Status: No family/caregiver present to determine baseline cognitive functioning                                 General Comments: Pt has PMH of dementia, poor historian. Able to follow commands with verbal and tactile cues needed.                Home Living Family/patient expects to be discharged to:: Skilled nursing facility                                 Additional Comments: [facility]      Prior Functioning/Environment Prior Level of Function : Patient poor historian/Family not available;Needs assist       Physical Assist : Mobility (physical) Mobility (physical): Bed mobility;Transfers;Gait;Stairs     ADLs Comments: pt has baseline cognitive deficits but reports he is able to do self care himself with occasional assist from " the girls"        OT Problem List: Decreased strength;Decreased activity tolerance;Decreased safety awareness;Impaired balance (sitting and/or standing);Impaired vision/perception;Decreased knowledge of use of DME or AE;Decreased cognition      OT Treatment/Interventions: Self-care/ADL training;Balance training;Therapeutic exercise;Therapeutic activities;Energy conservation;DME and/or AE instruction;Manual therapy;Patient/family education    OT Goals(Current goals can be found in the care plan section) Acute Rehab OT Goals Patient Stated Goal: to get better OT Goal Formulation: With patient Time For Goal Achievement: 08/31/21 Potential to Achieve Goals: Good ADL Goals Pt Will Perform Grooming: with supervision Pt Will Perform Lower Body Dressing: with supervision Pt Will Transfer to Toilet: with supervision Pt Will Perform  Toileting - Clothing Manipulation and hygiene: with supervision  OT Frequency: Min 2X/week       AM-PAC OT "6 Clicks" Daily Activity     Outcome Measure Help from another person eating meals?: A Little Help from another person taking care of personal grooming?: A Little Help from another person toileting, which includes using toliet, bedpan, or urinal?: A Little Help from another person bathing (including washing, rinsing, drying)?: A Little Help from another person to put on and taking off regular upper body clothing?: A Little Help from another person to put on and taking off regular lower body clothing?: A Little 6 Click Score: 18   End of Session Equipment Utilized During Treatment: Rolling walker (2 wheels) Nurse Communication: Mobility status  Activity Tolerance: Patient tolerated treatment well Patient left: in chair;with chair alarm set;with call bell/phone within reach  OT Visit Diagnosis: Unsteadiness on feet (R26.81);Muscle weakness (generalized) (M62.81);History of falling (Z91.81)                Time: 7902-4097 OT Time Calculation (min): 12 min Charges:  OT General Charges $OT Visit: 1 Visit OT Evaluation $OT Eval Moderate  Complexity: 1 [address] [place], OTR/L , CBIS ascom [phone]  08/17/21, 11:12 AM

## 2021-08-17 NOTE — Progress Notes (Signed)
Discharge note: Pt left via EMS. Foley intact and emptied prior to discharge. No belongings were found in room. IV cath removed prior to discharge.

## 2021-08-17 NOTE — Consult Note (Signed)
ANTICOAGULATION CONSULT NOTE   Pharmacy Consult for warfarin Indication: atrial fibrillation  Allergies  Allergen Reactions   Ace Inhibitors    Penicillins     Patient Measurements: Height: 5\' 11"  (180.3 cm) Weight: 81.6 kg (180 lb) IBW/kg (Calculated) : 75.3  Vital Signs: Temp: 97.8 F (36.6 C) (06/06 0856) BP: 129/58 (06/06 0856) Pulse Rate: 74 (06/06 0856)  Labs: Recent Labs    08/14/21 1716 08/15/21 0432 08/16/21 0624 08/16/21 1422 08/17/21 0556  HGB 11.5* 11.2* 9.7*  --   --   HCT 34.7* 34.4* 29.3*  --   --   PLT 291 248 202  --   --   LABPROT  --  16.8*  --  17.0* 16.5*  INR  --  1.4*  --  1.4* 1.3*  CREATININE 0.82 0.86 0.83  --   --      Estimated Creatinine Clearance: 58 mL/min (by C-G formula based on SCr of 0.83 mg/dL).   Medical History: Past Medical History:  Diagnosis Date   Arthritis    Atrial fibrillation (HCC)    controlled on coumadin   GERD (gastroesophageal reflux disease)    Glaucoma    Gout    Heart disease    Hyperlipidemia     Medications:  Warfarin 6mg  daily except for 3mg  every Tuesday, changed from 6mg  daily except for 3mg  every Wed and Sat.   Last dose PTA: 6/3 @ 0543   Assessment: 88 y.o. male with PMH of afib on warfarin, HLD, HTN, and gout who was recently hospitalized with AMS due to UTI/pyelonephritis due to MRSE presenting to the ED on 6/5 with concerns for AMS. CT and MRI stable with not acute abnormalities. Pharmacy has been consulted for warfarin dosing.   DDIs:  Warfarin + ceftriaxone >> may increase INR  Date INR Warfarin dose 6/4 1.4 6 mg 6/5 1.4 8 mg 6/6 1.3  Goal of Therapy:  INR 2-3 Monitor platelets by anticoagulation protocol: Yes   Plan:  INR remains subtherapeutic x 2 days, noted Afib (CHADS-Vasc 3- no VTE/stroke) Resume warfarin at 6mg  daily Consider transition to Eliquis Monitor daily INR, CBC, s/s of bleed   Annetta Deiss Rodriguez-Guzman PharmD, BCPS 08/17/2021 9:12 AM

## 2021-08-17 NOTE — Discharge Summary (Signed)
Physician Discharge Summary   Patient: Erik Larson MRN: [id number] DOB: 1924-04-06  Admit date:     08/14/2021  Discharge date: 08/17/21  Discharge Physician: Karie Kirks   PCP: Sofie Hartigan, MD   Recommendations at discharge:    Discharge to previous facility of residence Follow up with PCP in 7-10 days. Have CBC and BMP on that visit and report results to PCP. Follow up with Dr. Melrose Nakayama at soonest possible appointment. Keep previously arranged appointment with urology.  Discharge Diagnoses: Problem  Sherran Needs Syndrome  Polypharmacy  Altered Mental Status  Bph Associated With Nocturia  Long Term Current Use of Anticoagulant Therapy  Atrial fibrillation, controlled [facility])    Hospital Course: This patient is a 88 yr old man who recently was hospitalized with AMS due to UTI/pyelonephritis due to MRSE. He was discharged on 08/05/2021. He was brought to the ED on 08/14/2021 due to family's concerns about the patient's AMS. He was admitted out of concern that the patient again was infected with UTI. He was given IV UA is negative. The patient has no elevation of his WBC count. CT head demonstrated no evidence for acute intracranial abnormality. There is atrophy and chronic small vessel ischemic changes of the white matter. 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 There are bilateral mastoid effusions.   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    The patient has a past medical history of Sherran Needs syndrome that causes him to have hallucinations and delirium at times.   The patient's UA was negative for UTI. He has no fevers and no elevation of WBC.    The patient was quite lethargic on my visit on 08/15/2021. He was receiving Risperdal 0.5 bid. This was held due to his lethargy.   This morning the  patient is awake, alert, and conversant. I have discussed him with his neurologist Dr. Melrose Nakayama. His recommendation was to continue to hold the risperdal and follow up with him as outpatient.   No infectious cause for the patient's change in behavior has been found.  The patient has been evaluated by PT/OT. They have recommended rehab, but the patient has refused. He will be sent back to the facility from which he came with home health PT/OT.  Assessment and Plan: Altered Mental Status No putative source for infectious seen. Blood culture x 2: no growth Appears to be resolved. Risperidal held os of 08/15/2021 due to lethargy. The patient was discussed with his neurologist today. He questions if the increased dose of Risperdal could be the cause for the mental status changes. He recommends holding it and follow up with neurology on discharge.   UTI No evidence for UTI   Urinary Retention: The patient is having good Urine output so far. Will have nursing do PVR. He has been started on Flomax and has an appointment with urology as outpatient.   BPH Will check PVR.   Afib Rate is controlled Continue  on warfarin. Pharmacy has been consulted to manage warfarin.   Gout  no active issue    GERD ppi    HLD resume home regimen    Blindness/Glaucoma resume eye drops   Debility: The patient has been evaluated by PT/OT and have recommended rehab, however the patient states the wishes to return to the facility where he resides. He will be discharged to there with [facility] PT/OT  I have seen and examined this patient myself. I have spent more than 30 minutes in his evaluation and discharge.  Consultants: None Procedures performed: None  Disposition: Home Diet recommendation:  Discharge Diet Orders (From admission, onward)     Start     Ordered   08/17/21 0000  Diet - low sodium heart healthy        08/17/21 1306           Cardiac diet DISCHARGE MEDICATION: Allergies as of 08/17/2021        Reactions   Ace Inhibitors    Penicillins         Medication List     STOP taking these medications    risperiDONE 0.5 MG tablet Commonly known as: RISPERDAL       TAKE these medications    allopurinol 100 MG tablet Commonly known as: ZYLOPRIM Take 1 tablet (100 mg total) by mouth 2 (two) times daily.   dorzolamide-timolol 22.3-6.8 MG/ML ophthalmic solution Commonly known as: COSOPT Apply 1 drop to eye as needed. Dr Atilano Median   finasteride 5 MG tablet Commonly known as: PROSCAR Take 1 tablet (5 mg total) by mouth daily.   latanoprost 0.005 % ophthalmic solution Commonly known as: XALATAN Apply 1 drop to eye as needed. Dr Atilano Median   melatonin 3 MG Tabs tablet Take 3 mg by mouth at bedtime as needed.   polyethylene glycol 17 g packet Commonly known as: MIRALAX / GLYCOLAX Take 17 g by mouth 2 (two) times daily.   senna 8.6 MG tablet Commonly known as: SENOKOT Take 2 tablets by mouth 2 (two) times daily.   tamsulosin 0.4 MG Caps capsule Commonly known as: FLOMAX Take 0.4 mg by mouth daily.   warfarin 6 MG tablet Commonly known as: COUMADIN TAKE 1 TABLET DAILY What changed:  how much to take when to take this additional instructions        Discharge Exam: Filed Weights   08/14/21 1715  Weight: 81.6 kg   Vitals:   08/17/21 0856 08/17/21 1225  BP: (!) 129/58 120/60  Pulse: 74 73  Resp: 17 15  Temp: 97.8 F (36.6 C) (!) 97.5 F (36.4 C)  SpO2: 99% 98%   Exam:  Constitutional:  The patient is awake, alert, and oriented x 3. No acute distress. Respiratory:  No increased work of breathing. No wheezes, rales, or rhonchi No tactile fremitus Cardiovascular:  Regular rate and rhythm No murmurs, ectopy, or gallups. No lateral PMI. No thrills. Abdomen:  Abdomen is soft, non-tender, non-distended No hernias, masses, or organomegaly Normoactive bowel sounds.  Musculoskeletal:  No cyanosis, clubbing, or edema Skin:  No rashes, lesions,  ulcers palpation of skin: no induration or nodules Neurologic:  CN 2-12 intact Sensation all 4 extremities intact Psychiatric:  Mental status Mood, affect appropriate Orientation to person, place, time  judgment and insight appear intact   Condition at discharge: fair  The results of significant diagnostics from this hospitalization (including imaging, microbiology, ancillary and laboratory) are listed below for reference.   Imaging Studies: DG Chest 2 View  Result Date: 08/14/2021 CLINICAL DATA:  AMS EXAM: CHEST - 2 VIEW COMPARISON:  Chest x-ray 11/12/2020 FINDINGS: The heart and mediastinal contours are unchanged. Aortic calcification. No focal consolidation. No pulmonary edema. No pleural effusion. No pneumothorax. No acute osseous abnormality. IMPRESSION: 1. No active cardiopulmonary disease. 2.  Aortic Atherosclerosis (ICD10-I70.0). Electronically Signed   By: Iven Finn M.D.   On: 08/14/2021 21:37   CT Head Wo  Contrast  Result Date: 08/14/2021 CLINICAL DATA:  Altered mental status EXAM: CT HEAD WITHOUT CONTRAST TECHNIQUE: Contiguous axial images were obtained from the base of the skull through the vertex without intravenous contrast. RADIATION DOSE REDUCTION: This exam was performed according to the departmental dose-optimization program which includes automated exposure control, adjustment of the mA and/or kV according to patient size and/or use of iterative reconstruction technique. COMPARISON:  CT brain 11/12/2020 FINDINGS: Brain: No acute territorial infarction, hemorrhage or intracranial mass. Atrophy and mild chronic small vessel ischemic changes of the white matter. Stable ventricle size. Small chronic appearing left basal ganglial infarct but new compared to prior CT. Vascular: No hyperdense vessels.  Carotid vascular calcification Skull: Normal. Negative for fracture or focal lesion. Sinuses/Orbits: No acute finding. Other: None IMPRESSION: 1. No CT evidence for acute  intracranial abnormality. 2. Atrophy and chronic small vessel ischemic changes of the white matter Electronically Signed   By: Donavan Foil M.D.   On: 08/14/2021 22:38   MR BRAIN WO CONTRAST  Result Date: 08/15/2021 CLINICAL DATA:  Initial evaluation for mental status change, unknown cause. EXAM: MRI HEAD WITHOUT CONTRAST TECHNIQUE: Multiplanar, multiecho pulse sequences of the brain and surrounding structures were obtained without intravenous contrast. COMPARISON:  Prior head CT from 08/14/2021. FINDINGS: Brain: Diffuse prominence of the CSF containing spaces compatible generalized cerebral atrophy. Patchy T2/FLAIR hyperintensity involving the periventricular and deep white matter both cerebral hemispheres as well as the pons, consistent with chronic small vessel ischemic disease, mild for age. Remote hemorrhagic lacunar infarct seen involving the left basal ganglia/subinsular white matter. No abnormal foci of restricted diffusion to suggest acute or subacute ischemia. Gray-white matter differentiation otherwise maintained. No other areas of remote cortical infarction. No acute intracranial hemorrhage. Additional single punctate chronic microhemorrhage noted within the right thalamus, likely hypertensive in nature. No mass lesion, midline shift or mass effect. Mild ventricular prominence related to global parenchymal volume loss of hydrocephalus. Cavum et septum pellucidum noted. No extra-axial fluid collection. Pituitary gland suprasellar region within normal limits. Midline structures intact and normally formed. Vascular: Major intracranial vascular flow voids are maintained. Skull and upper cervical spine: Craniocervical junction within normal limits. Bone marrow signal intensity normal. No scalp soft tissue abnormality. Sinuses/Orbits: Prior bilateral ocular lens replacement. Mild chronic mucosal thickening present about the ethmoidal air cells and maxillary sinuses. Small to moderate right mastoid effusion  noted. Other: None. IMPRESSION: 1. No acute intracranial abnormality. 2. Remote hemorrhagic lacunar infarct involving the left basal ganglia/subinsular white matter. 3. Generalized age-related cerebral atrophy with mild chronic small vessel ischemic disease. 4. Small to moderate right mastoid effusion, of uncertain significance. Correlation with physical exam suggested. Electronically Signed   By: Jeannine Boga M.D.   On: 08/15/2021 03:26    Microbiology: Results for orders placed or performed during the hospital encounter of 08/14/21  Urine Culture     Status: None   Collection Time: 08/14/21  8:17 PM   Specimen: Urine, Clean Catch  Result Value Ref Range Status   Specimen Description   Final    URINE, CLEAN CATCH Performed at [facility], [address], [place], [place] [address]    Special Requests   Final    NONE Performed at [facility], [address], [place], [place] [address]    Culture   Final    NO GROWTH Performed at [facility], [place] [address], [place], [place] [address]    Report Status 08/16/2021 FINAL  Final  MRSA  Next Gen by PCR, Nasal     Status: None   Collection Time: 08/15/21  4:32 AM   Specimen: Nasal Swab  Result Value Ref Range Status   MRSA by PCR Next Gen NOT DETECTED NOT DETECTED Final    Comment: (NOTE) The GeneXpert MRSA Assay (FDA approved for NASAL specimens only), is one component of a comprehensive MRSA colonization surveillance program. It is not intended to diagnose MRSA infection nor to guide or monitor treatment for MRSA infections. Test performance is not FDA approved in patients less than 68 years old. Performed at [facility], [address] [place], [place] [address]   Culture, blood (Routine X 2) w Reflex to ID Panel     Status: None (Preliminary result)   Collection Time: 08/15/21  5:12 AM   Specimen: BLOOD  Result Value Ref Range Status   Specimen Description BLOOD BLOOD LEFT WRIST   Final   Special Requests   Final    BOTTLES DRAWN AEROBIC AND ANAEROBIC Blood Culture results may not be optimal due to an inadequate volume of blood received in culture bottles   Culture   Final    NO GROWTH 2 DAYS Performed at [facility], [address] [place], [place] [address]    Report Status PENDING  Incomplete  Culture, blood (Routine X 2) w Reflex to ID Panel     Status: None (Preliminary result)   Collection Time: 08/15/21  5:12 AM   Specimen: BLOOD  Result Value Ref Range Status   Specimen Description BLOOD RIGHT ANTECUBITAL  Final   Special Requests   Final    BOTTLES DRAWN AEROBIC AND ANAEROBIC Blood Culture results may not be optimal due to an inadequate volume of blood received in culture bottles   Culture   Final    NO GROWTH 2 DAYS Performed at [facility], [address] [place], [place] [address]    Report Status PENDING  Incomplete    Labs: CBC: Recent Labs  Lab 08/14/21 1716 08/15/21 0432 08/16/21 0624  WBC 5.7 5.4 5.9  NEUTROABS  --   --  3.3  HGB 11.5* 11.2* 9.7*  HCT 34.7* 34.4* 29.3*  MCV 93.8 95.8 94.5  PLT 291 248 [id number]   Basic Metabolic Panel: Recent Labs  Lab 08/14/21 1716 08/15/21 0432 08/16/21 0624  NA 137 137 139  K 4.1 3.8 3.6  CL 106 107 108  CO2 25 26 26   GLUCOSE 108* 102* 94  BUN 18 15 16   CREATININE 0.82 0.86 0.83  CALCIUM 8.6* 8.3* 8.1*   Liver Function Tests: Recent Labs  Lab 08/15/21 0432  AST 23  ALT 18  ALKPHOS 118  BILITOT 0.6  PROT 7.4  ALBUMIN 3.4*   CBG: No results for input(s): GLUCAP in the last 168 hours.  Discharge time spent: greater than 30 minutes.  Signed: Balen Woolum, DO [facility] 08/17/2021

## 2021-08-17 NOTE — Progress Notes (Signed)
Physical Therapy Evaluation Patient Details Name: Erik Larson MRN: [id number] DOB: 1924-10-27 Today's Date: 08/17/2021  History of Present Illness  Pt is a 88 y.o. male with medical history significant for BPH, afib on warfarin, gout,GERD, HLD, glaucoma  dementia,who has interim history of admission to [facility] 5/13-5/25 for sepsis secondary to UTI.  At that time patient was found to have methicillin resistant Staph Epidermidis bacteremia with associated pyelonephritis for which patient was treated with vancomycin. Patient per daughter had acute change in his mentation, similar to prior episodes when he has been diagnosed with a UTI. Daughter notes no other symptoms other than change in MS and patient is poor historian and unable to give history. Daughter also stated pt is blind.  Clinical Impression  Pt is a pleasant 88 year old male who was admitted for AMS. Pt performs bed mobility, transfers, and ambulation with min A and use of RW. Verbal and tactile cues utilized throughout session due to pt report of difficulty with vision (inability to report how many fingers I was holding up but able to see shadows/figure outlines). Pt demonstrates deficits with LE strength, transfers, functional mobility tasks, and endurance. Pt ambulated 120 feet with min A needed for RW guidance due to visual deficits and tendency to veer left. Would benefit from skilled PT to address above deficits and promote optimal return to PLOF. Currently recommend rehab at discharge.      Recommendations for follow up therapy are one component of a multi-disciplinary discharge planning process, led by the attending physician.  Recommendations may be updated based on patient status, additional functional criteria and insurance authorization.  Follow Up Recommendations Skilled nursing-short term rehab (<3 hours/day)    Assistance Recommended at Discharge Frequent or constant Supervision/Assistance  Patient can return home with the  following  A lot of help with walking and/or transfers;A little help with bathing/dressing/bathroom;Assistance with feeding;Assist for transportation;Help with stairs or ramp for entrance    Equipment Recommendations    Recommendations for Other Services       Functional Status Assessment Patient has had a recent decline in their functional status and demonstrates the ability to make significant improvements in function in a reasonable and predictable amount of time.     Precautions / Restrictions Precautions Precautions: Fall Precaution Comments: blind Restrictions Weight Bearing Restrictions: No      Mobility  Bed Mobility Overal bed mobility: Needs Assistance Bed Mobility: Supine to Sit, Rolling Rolling: Min assist   Supine to sit: Min assist     General bed mobility comments: Verbal and tactile cues needed.    Transfers Overall transfer level: Needs assistance Equipment used: Rolling walker (2 wheels) Transfers: Sit to/from Stand, Bed to chair/wheelchair/BSC Sit to Stand: Min assist   Step pivot transfers: Min assist       General transfer comment: Verbal/tactile cues for scooting, hand placement, and posture. Tactile cues needed due to visual deficits.    Ambulation/Gait Ambulation/Gait assistance: Min assist Gait Distance (Feet): 120 Feet Assistive device: Rolling walker (2 wheels) Gait Pattern/deviations: Shuffle, Trunk flexed, Knee flexed in stance - right, Decreased stride length, Decreased step length - right, Decreased step length - left       General Gait Details: Min A needed for guiding RW. Tendency to veer left with inability to see obstacles.  Stairs            Wheelchair Mobility    Modified Rankin (Stroke Patients Only)       Balance Overall balance assessment:  Needs assistance Sitting-balance support: No upper extremity supported, Feet supported Sitting balance-Leahy Scale: Good Sitting balance - Comments: Able to sit EOB with  bilat UE reaching for walker, able to sit on BSC with SBA.   Standing balance support: Bilateral upper extremity supported Standing balance-Leahy Scale: Fair Standing balance comment: Used RW in standing. Able to march, amb, complete toilet hygiene without LOB.                             Pertinent Vitals/Pain Pain Assessment Pain Assessment: No/denies pain    Home Living Family/patient expects to be discharged to:: Skilled nursing facility                   Additional Comments: [facility]    Prior Function Prior Level of Function : Patient poor historian/Family not available;Needs assist       Physical Assist : Mobility (physical) Mobility (physical): Bed mobility;Transfers;Gait;Stairs         Hand Dominance   Dominant Hand: Right    Extremity/Trunk Assessment            Communication   Communication: No difficulties  Cognition Arousal/Alertness: Awake/alert Behavior During Therapy: WFL for tasks assessed/performed Overall Cognitive Status: No family/caregiver present to determine baseline cognitive functioning                                 General Comments: Pt has PMH of dementia, poor historian. Able to follow commands with verbal and tactile cues needed.        General Comments      Exercises Other Exercises Other Exercises: Bilat LE supine ex included AP, SLR, Heel Slides of 10 reps ea with verbal/tactile cues required. Bilat LE standing marches 10 reps prior to ambulation with CGA. Other Exercises: Sit-to-stand to/from chair/BSC for pt to compelte bowel movement; min A required for toilet hygiene   Assessment/Plan    PT Assessment Patient needs continued PT services  PT Problem List Decreased strength;Decreased activity tolerance;Decreased balance;Decreased mobility;Decreased safety awareness       PT Treatment Interventions Gait training;Functional mobility training;Therapeutic activities;Therapeutic  exercise;Patient/family education    PT Goals (Current goals can be found in the Care Plan section)  Acute Rehab PT Goals Patient Stated Goal: To go home. PT Goal Formulation: With patient Time For Goal Achievement: 08/31/21 Potential to Achieve Goals: Good    Frequency Min 2X/week     Co-evaluation               AM-PAC PT "6 Clicks" Mobility  Outcome Measure Help needed turning from your back to your side while in a flat bed without using bedrails?: A Little Help needed moving from lying on your back to sitting on the side of a flat bed without using bedrails?: A Little Help needed moving to and from a bed to a chair (including a wheelchair)?: A Little Help needed standing up from a chair using your arms (e.g., wheelchair or bedside chair)?: A Little Help needed to walk in hospital room?: A Little Help needed climbing 3-5 steps with a railing? : A Lot 6 Click Score: 17    End of Session Equipment Utilized During Treatment: Gait belt Activity Tolerance: Patient tolerated treatment well Patient left: in chair;with call bell/phone within reach;with chair alarm set Nurse Communication: Mobility status PT Visit Diagnosis: Muscle weakness (generalized) (M62.81);Difficulty in walking, not elsewhere  classified (R26.2)    Time: [id number] PT Time Calculation (min) (ACUTE ONLY): 32 min   Charges:   PT Evaluation $PT Eval Low Complexity: 1 Low PT Treatments $Therapeutic Exercise: 8-22 mins        Diksha Tagliaferro, SPT   Romy Mcgue 08/17/2021, 1:35 PM

## 2021-08-17 NOTE — TOC Transition Note (Signed)
Transition of Care [facility]) - CM/SW Discharge Note   Patient Details  Name: Erik Larson MRN: [id number] Date of Birth: [birth year]  [facility] CM/SW Contact:  Eileen Stanford, LCSW Phone Number: 08/17/2021, 1:59 PM   Clinical Narrative:  Clinical Social Worker facilitated patient discharge including contacting patient family and facility to confirm patient discharge plans.  Clinical information faxed to facility and family agreeable with plan.  CSW arranged ambulance transport via [facility] to [facility] .  RN to call [phone] for report prior to discharge.     Final next level of care: Skilled Nursing Facility Barriers to Discharge: No Barriers Identified   Patient Goals and CMS Choice Patient states their goals for this hospitalization and ongoing recovery are:: to return to [facility]   Choice offered to / list presented to : Adult Children  Discharge Placement              Patient chooses bed at:  ([facility]) Patient to be transferred to facility by: [facility]   Patient and family notified of of transfer: 08/17/21  Discharge Plan and Services In-house Referral: NA   Post Acute Care Choice: [place]                               Social Determinants of Health (SDOH) Interventions     Readmission Risk Interventions     View : No data to display.

## 2021-08-17 NOTE — Progress Notes (Signed)
Gave report to Amy at [facility]. Waiting on EMS.

## 2021-08-18 ENCOUNTER — Encounter: Payer: Self-pay | Admitting: Urology

## 2021-08-18 ENCOUNTER — Ambulatory Visit (INDEPENDENT_AMBULATORY_CARE_PROVIDER_SITE_OTHER): Payer: Medicare Other | Admitting: Urology

## 2021-08-18 ENCOUNTER — Ambulatory Visit: Payer: Medicare Other | Admitting: Urology

## 2021-08-18 VITALS — BP 125/78 | HR 67 | Ht 70.0 in | Wt 165.0 lb

## 2021-08-18 DIAGNOSIS — R338 Other retention of urine: Secondary | ICD-10-CM

## 2021-08-18 DIAGNOSIS — N401 Enlarged prostate with lower urinary tract symptoms: Secondary | ICD-10-CM

## 2021-08-18 DIAGNOSIS — N390 Urinary tract infection, site not specified: Secondary | ICD-10-CM | POA: Diagnosis not present

## 2021-08-18 LAB — BLADDER SCAN AMB NON-IMAGING

## 2021-08-18 LAB — VITAMIN B1: Vitamin B1 (Thiamine): 100.5 nmol/L (ref 66.5–200.0)

## 2021-08-18 NOTE — Progress Notes (Signed)
Catheter Removal  Patient is present today for a catheter removal.  41ml of water was drained from the balloon. A 18FR foley cath was removed from the bladder no complications were noted . Patient tolerated well.  Performed by: Ples Specter CMA  Follow up/ Additional notes: Return this afternoon

## 2021-08-18 NOTE — Progress Notes (Signed)
Erik Larson presents this afternoon for PVR after his catheter was removed this morning.  Unfortunately, he resides at a SNIF, [facility], and according to his daughter, [name], they did not give him any fluids to drink.  He is not urinated at all and he only has 49 mL in his bladder at this time.  He had been drinking water while here in the office.  I did discuss my concerns with [name] regarding the high probability that he will go into urinary retention tonight and advised that he return here in the morning for follow-up PVR and likely a catheter placement.  She stated that she would not be able to bring him in the morning as she is babysitting her grandchildren and their parents cannot take off her work as they are going on vacation this weekend.  I explained that going into urinary retention can be quite painful and possibly resulting in permanent bladder damage and kidney damage.     08/18/21 13:46  Scan Result 51mL (C) (C) (C)  (C): Corrected  These are the PVR as by bladder scan after being here for 3 hours and drinking water.  His facility stated they will be able to bring him in the morning, so that we can get a second PVR and hopefully prevent to visit to the emergency room tonight.  I spent 30 minutes on the day of the encounter to include pre-visit record review, face-to-face time with the patient, and post-visit ordering of tests.

## 2021-08-18 NOTE — Patient Instructions (Signed)
Stop Flomax and Start Silodosin 8mg  daily.

## 2021-08-18 NOTE — Progress Notes (Signed)
08/18/2021 8:15 AM   Erik Larson 03-27-1924 [id number]  Referring provider: Sofie Hartigan, [place],  [place] [address]  Chief Complaint  Patient presents with   Urinary Retention    HPI: Erik Larson is a 88 y.o. male who presents for follow-up of urinary retention.  Admitted [facility] 07/24/2021 with altered mental status and found to have urinary retention and methicillin-resistant Staph epidermidis UTI/bacteremia Foley catheter was placed and urology follow-up was recommended for voiding trial Saw Dr. Erlene Quan in 2018 for BPH.  Residual at that visit was 149 mL.  He had been on tamsulosin previously and could not tolerate secondary to dizziness.  He was started on finasteride at that visit and did not follow up He has remained on finasteride.  Was started on tamsulosin at [facility].  He ambulates some with a walker and there has apparently been no issues with dizziness. Hospitalized [facility] 08/14/2021 with altered mental status.  UA was negative for infection.  He has a follow-up in urology later this afternoon.  He was discharged yesterday.   PMH: Past Medical History:  Diagnosis Date   Arthritis    Atrial fibrillation ([place])    controlled on coumadin   GERD (gastroesophageal reflux disease)    Glaucoma    Gout    Heart disease    Hyperlipidemia     Surgical History: Past Surgical History:  Procedure Laterality Date   COLONOSCOPY  2005 ?   GLAUCOMA SURGERY      Home Medications:  Allergies as of 08/18/2021       Reactions   Ace Inhibitors    Penicillins         Medication List        Accurate as of August 18, 2021  8:15 AM. If you have any questions, ask your nurse or doctor.          allopurinol 100 MG tablet Commonly known as: ZYLOPRIM Take 1 tablet (100 mg total) by mouth 2 (two) times daily.   dorzolamide-timolol 22.3-6.8 MG/ML ophthalmic solution Commonly known as: COSOPT Apply 1 drop to eye as needed. Dr Atilano Median    finasteride 5 MG tablet Commonly known as: PROSCAR Take 1 tablet (5 mg total) by mouth daily.   latanoprost 0.005 % ophthalmic solution Commonly known as: XALATAN Apply 1 drop to eye as needed. Dr Atilano Median   melatonin 3 MG Tabs tablet Take 3 mg by mouth at bedtime as needed.   polyethylene glycol 17 g packet Commonly known as: MIRALAX / GLYCOLAX Take 17 g by mouth 2 (two) times daily.   senna 8.6 MG tablet Commonly known as: SENOKOT Take 2 tablets by mouth 2 (two) times daily.   tamsulosin 0.4 MG Caps capsule Commonly known as: FLOMAX Take 0.4 mg by mouth daily.   warfarin 6 MG tablet Commonly known as: COUMADIN TAKE 1 TABLET DAILY What changed:  how much to take when to take this additional instructions        Allergies:  Allergies  Allergen Reactions   Ace Inhibitors    Penicillins     Family History: Family History  Problem Relation Age of Onset   Cancer Maternal Uncle    Alzheimer's disease Mother    Healthy Father    Healthy Sister    Healthy Sister    Healthy Sister    Heart disease Brother     Social History:  reports that he quit smoking about 53 years ago. His smoking  use included cigars. He has never used smokeless tobacco. He reports that he does not currently use alcohol. He reports that he does not use drugs.   Physical Exam: BP 125/78   Pulse 67   Ht 5\' 10"  (1.778 m)   Wt 165 lb (74.8 kg)   BMI 23.68 kg/m   Constitutional:  Alert, no acute distress. HEENT: [place] AT, moist mucus membranes.  Trachea midline, no masses. Cardiovascular: No clubbing, cyanosis, or edema. Respiratory: Normal respiratory effort, no increased work of breathing. GU: Foley catheter draining clear urine   Assessment & Plan:    1.  BPH with urinary retention Catheter removed for voiding trial.  He has a follow-up this afternoon for PVR Continue finasteride History of dizziness with tamsulosin and would recommend discontinuing and start silodosin 8 mg  daily  2.  History UTI Urine culture negative recent hospitalization Schedule renal ultrasound to screen upper tracts    Abbie Sons, MD  [facility] [address] [place], [place] [address] [phone]

## 2021-08-19 ENCOUNTER — Encounter: Payer: Self-pay | Admitting: Urology

## 2021-08-19 ENCOUNTER — Ambulatory Visit (INDEPENDENT_AMBULATORY_CARE_PROVIDER_SITE_OTHER): Payer: Medicare Other | Admitting: Urology

## 2021-08-19 DIAGNOSIS — N401 Enlarged prostate with lower urinary tract symptoms: Secondary | ICD-10-CM | POA: Diagnosis not present

## 2021-08-19 DIAGNOSIS — R338 Other retention of urine: Secondary | ICD-10-CM | POA: Diagnosis not present

## 2021-08-19 LAB — BLADDER SCAN AMB NON-IMAGING

## 2021-08-19 LAB — BLOOD GAS, VENOUS
Acid-Base Excess: 0.6 mmol/L (ref 0.0–2.0)
Bicarbonate: 26.6 mmol/L (ref 20.0–28.0)
Patient temperature: 37
pCO2, Ven: 47 mmHg (ref 44–60)
pH, Ven: 7.36 (ref 7.25–7.43)

## 2021-08-19 NOTE — Progress Notes (Addendum)
08/19/21 9:14 AM   Ty Hilts [birth year] [id number]  Referring provider:  Sofie Hartigan, MD [place],  [place] [address]  Urological history  BPH with urinary retention - Admitted [facility] 07/24/2021 with altered mental status and found to have urinary retention and methicillin-resistant Staph epidermidis UTI/bacteremia - Foley catheter was placed and urology follow-up was recommended for voiding trial - Saw Dr. Erlene Quan in 2018 for BPH.  Residual at that visit was 149 mL.  He had been on tamsulosin previously and could not tolerate secondary to dizziness.  He was started on finasteride at that visit and did not follow up - He has remained on finasteride.  Was started on tamsulosin at [facility].  - He had an unsuccessful voiding trial yesterday. Catheter was removed and he was seen by Dr Bernardo Heater. He returned with PVR as high as 189 ml.  - PVR 520ml  Chief Complaint  Patient presents with   Follow-up     HPI: TAITON ARCIERO is a 88 y.o.male who presents today for a PVR.   His granddaughter, Otila Kluver, was present during the visit.    His PVR today is 503 ml.   He reports that he has tenderness with catheter in place. He has not made any trips to the bathroom since catheter removal yesterday. He reports that he feels comfortable he denies any abdominal pain or urge to urinate.    PMH: Past Medical History:  Diagnosis Date   Arthritis    Atrial fibrillation ([place])    controlled on coumadin   GERD (gastroesophageal reflux disease)    Glaucoma    Gout    Heart disease    Hyperlipidemia     Surgical History: Past Surgical History:  Procedure Laterality Date   COLONOSCOPY  2005 ?   GLAUCOMA SURGERY      Home Medications:  Allergies as of 08/19/2021       Reactions   Ace Inhibitors    Penicillins         Medication List        Accurate as of August 19, 2021  9:14 AM. If you have any questions, ask your nurse or doctor.          allopurinol 100 MG  tablet Commonly known as: ZYLOPRIM Take 1 tablet (100 mg total) by mouth 2 (two) times daily.   dorzolamide-timolol 22.3-6.8 MG/ML ophthalmic solution Commonly known as: COSOPT Apply 1 drop to eye as needed. Dr Atilano Median   finasteride 5 MG tablet Commonly known as: PROSCAR Take 1 tablet (5 mg total) by mouth daily.   latanoprost 0.005 % ophthalmic solution Commonly known as: XALATAN Apply 1 drop to eye as needed. Dr Atilano Median   melatonin 3 MG Tabs tablet Take 3 mg by mouth at bedtime as needed.   polyethylene glycol 17 g packet Commonly known as: MIRALAX / GLYCOLAX Take 17 g by mouth 2 (two) times daily.   senna 8.6 MG tablet Commonly known as: SENOKOT Take 2 tablets by mouth 2 (two) times daily.   tamsulosin 0.4 MG Caps capsule Commonly known as: FLOMAX Take 0.4 mg by mouth daily.   warfarin 6 MG tablet Commonly known as: COUMADIN TAKE 1 TABLET DAILY What changed:  how much to take when to take this additional instructions        Allergies:  Allergies  Allergen Reactions   Ace Inhibitors    Penicillins     Family History: Family History  Problem Relation Age  of Onset   Cancer Maternal Uncle    Alzheimer's disease Mother    Healthy Father    Healthy Sister    Healthy Sister    Healthy Sister    Heart disease Brother     Social History:  reports that he quit smoking about 53 years ago. His smoking use included cigars. He has never used smokeless tobacco. He reports that he does not currently use alcohol. He reports that he does not use drugs.   Physical Exam: There were no vitals taken for this visit.  Constitutional:  Alert and oriented, No acute distress. HEENT: [place] AT, moist mucus membranes.  Trachea midline, no masses. Cardiovascular: No clubbing, cyanosis, or edema. Respiratory: Normal respiratory effort, no increased work of breathing. Skin: No rashes, bruises or suspicious lesions. Neurologic: Grossly intact, no focal deficits, moving all 4  extremities. Psychiatric: Normal mood and affect.  Laboratory Data: Lab Results  Component Value Date   CREATININE 0.83 08/16/2021   No results found for: "HGBA1C"  Component     Latest Ref Rng 08/15/2021 08/16/2021  Glucose     70 - 99 mg/dL 102 (H)  94   BUN     8 - 23 mg/dL 15  16   Creatinine     0.61 - 1.24 mg/dL 0.86  0.83   Sodium     135 - 145 mmol/L 137  139   Potassium     3.5 - 5.1 mmol/L 3.8  3.6   Chloride     98 - 111 mmol/L 107  108   CO2     22 - 32 mmol/L 26  26   Calcium     8.9 - 10.3 mg/dL 8.3 (L)  8.1 (L)   Albumin     3.5 - 5.0 g/dL 3.4 (L)    Total Protein     6.5 - 8.1 g/dL 7.4    AST     15 - 41 U/L 23    ALT     0 - 44 U/L 18    Alkaline Phosphatase     38 - 126 U/L 118    Total Bilirubin     0.3 - 1.2 mg/dL 0.6    GFR, Estimated     >60 mL/min >60  >60   Anion gap     5 - 15  4 (L)  5     Legend: (H) High (L) Low  Component     Latest Ref Rng 08/16/2021  WBC     4.0 - 10.5 K/uL 5.9   RBC     4.22 - 5.81 MIL/uL 3.10 (L)   Hemoglobin     13.0 - 17.0 g/dL 9.7 (L)   HCT     39.0 - 52.0 % 29.3 (L)   MCV     80.0 - 100.0 fL 94.5   MCH     26.0 - 34.0 pg 31.3   MCHC     30.0 - 36.0 g/dL 33.1   RDW     11.5 - 15.5 % 13.4   Platelets     150 - 400 K/uL 202   Neutrophils     % 56   Lymphs     Not Estab. %   Monocytes     Not Estab. %   Eos     Not Estab. %   Basos     Not Estab. %   NEUT#     1.7 - 7.7 K/uL 3.3  Lymphocyte #     0.7 - 4.0 K/uL 1.3   Monocytes Absolute     0.1 - 0.9 x10E3/uL   EOS (ABSOLUTE)     0.0 - 0.4 x10E3/uL   Basophils Absolute     0.0 - 0.1 K/uL 0.1   Immature Granulocytes     % 1   Immature Grans (Abs)     0.0 - 0.1 x10E3/uL   nRBC     0.0 - 0.2 % 0.0   Lymphocytes     % 21   Monocytes Relative     % 15   Monocyte #     0.1 - 1.0 K/uL 0.9   Eosinophil     % 6   Eosinophils Absolute     0.0 - 0.5 K/uL 0.4   Basophil     % 1   Abs Immature Granulocytes     0.00 - 0.07 K/uL  0.03   I have reviewed the labs.    Pertinent Imaging: Results for orders placed or performed in visit on 08/19/21  Bladder Scan (Post Void Residual) in office  Result Value Ref Range   Scan Result 582mL    Simple Catheter Placement  Due to urinary retention patient is present today for a foley cath placement.  Patient was cleaned and prepped in a sterile fashion with betadine. A 18 FR foley catheter was inserted, urine return was noted  503 ml, urine was clear/yellow in color.  The balloon was filled with 10cc of sterile water.  A  bag was attached for drainage. Patient was also given a night bag to take home and was given instruction on how to change from one bag to another.  Patient was given instruction on proper catheter care.  Patient tolerated well, no complications were noted   Performed by: Zara Council, PA-C    Assessment & Plan:    BPH with urinary retention - Patient have catheter placed today  PVR 503 ml - Will return in 1 week for a second voiding trial.  - Continue finasteride daily and hopefully start silodosin daily  - I have concerns that his urinary retention is due to a non functioning bladder vs BOO as his 18 Fr two-way catheter was placed without any resistance   Return in about 1 week (around 08/26/2021) for TOV and PVR .  [facility] [address], [place], [place] [address] [phone]  I, Kirke Shaggy Littlejohn,acting as a scribe for [facility], PA-C.,have documented all relevant documentation on the behalf of Keyera Hattabaugh, PA-C,as directed by  [facility], PA-C while in the presence of [place], PA-C.  I have reviewed the above documentation for accuracy and completeness, and I agree with the above.    Zara Council, PA-C

## 2021-08-20 ENCOUNTER — Ambulatory Visit: Payer: Medicare Other | Admitting: Urology

## 2021-08-20 LAB — CULTURE, BLOOD (ROUTINE X 2)
Culture: NO GROWTH
Culture: NO GROWTH

## 2021-08-26 ENCOUNTER — Encounter: Payer: Self-pay | Admitting: Urology

## 2021-08-26 ENCOUNTER — Ambulatory Visit (INDEPENDENT_AMBULATORY_CARE_PROVIDER_SITE_OTHER): Payer: Medicare Other | Admitting: Urology

## 2021-08-26 ENCOUNTER — Ambulatory Visit: Payer: Medicare Other | Admitting: Urology

## 2021-08-26 DIAGNOSIS — R338 Other retention of urine: Secondary | ICD-10-CM

## 2021-08-26 DIAGNOSIS — R339 Retention of urine, unspecified: Secondary | ICD-10-CM

## 2021-08-26 DIAGNOSIS — N401 Enlarged prostate with lower urinary tract symptoms: Secondary | ICD-10-CM | POA: Diagnosis not present

## 2021-08-26 LAB — BLADDER SCAN AMB NON-IMAGING

## 2021-08-26 NOTE — Progress Notes (Signed)
08/26/21 3:44 PM   Erik Larson [birth year] [id number]  Referring provider:  Marina Goodell, MD [address] [place],  [place] [address]  Urological history  BPH with urinary retention - Admitted [facility] 07/24/2021 with altered mental status and found to have urinary retention and methicillin-resistant Staph epidermidis UTI/bacteremia - Foley catheter was placed and urology follow-up was recommended for voiding trial - Saw Dr. Apolinar Junes in 2018 for BPH.  Residual at that visit was 149 mL.  He had been on tamsulosin previously and could not tolerate secondary to dizziness.  He was started on finasteride at that visit and did not follow up - He has remained on finasteride.  Was started on tamsulosin at [facility].  - He had an unsuccessful voiding trial on 08/18/2021. Catheter was removed and he was seen by Dr Lonna Cobb. He returned with PVR as high as 189 ml.   Chief Complaint  Patient presents with   Benign Prostatic Hypertrophy      HPI: Erik Larson is a 88 y.o.male who presents today for a second voiding trial with his grand daughter, Erik Larson.  Catheter Removal  Patient is present today for a catheter removal.  9 ml of water was drained from the balloon. A 18 FR foley cath was removed from the bladder no complications were noted . Patient tolerated well.  Performed by: Michiel Cowboy, PA-C   According to [facility] from facility, he is receiving finasteride 5 mg daily and silodosin 8 mg daily.  He presents today, he states that his facility has been giving him fluids,  but he has not urinated.  His PVR is 532 mL.    PMH: Past Medical History:  Diagnosis Date   Arthritis    Atrial fibrillation ([facility])    controlled on coumadin   GERD (gastroesophageal reflux disease)    Glaucoma    Gout    Heart disease    Hyperlipidemia     Surgical History: Past Surgical History:  Procedure Laterality Date   COLONOSCOPY  2005 ?   GLAUCOMA SURGERY      Home Medications:  Allergies as  of 08/26/2021       Reactions   Ace Inhibitors    Penicillins         Medication List        Accurate as of August 26, 2021  3:44 PM. If you have any questions, ask your nurse or doctor.          allopurinol 100 MG tablet Commonly known as: ZYLOPRIM Take 1 tablet (100 mg total) by mouth 2 (two) times daily.   dorzolamide-timolol 22.3-6.8 MG/ML ophthalmic solution Commonly known as: COSOPT Apply 1 drop to eye as needed. Dr Beverely Pace   finasteride 5 MG tablet Commonly known as: PROSCAR Take 1 tablet (5 mg total) by mouth daily.   latanoprost 0.005 % ophthalmic solution Commonly known as: XALATAN Apply 1 drop to eye as needed. Dr Beverely Pace   melatonin 3 MG Tabs tablet Take 3 mg by mouth at bedtime as needed.   polyethylene glycol 17 g packet Commonly known as: MIRALAX / GLYCOLAX Take 17 g by mouth 2 (two) times daily.   senna 8.6 MG tablet Commonly known as: SENOKOT Take 2 tablets by mouth 2 (two) times daily.   tamsulosin 0.4 MG Caps capsule Commonly known as: FLOMAX Take 0.4 mg by mouth daily.   warfarin 6 MG tablet Commonly known as: COUMADIN TAKE 1 TABLET DAILY What changed:  how much to  take when to take this additional instructions        Allergies:  Allergies  Allergen Reactions   Ace Inhibitors    Penicillins     Family History: Family History  Problem Relation Age of Onset   Cancer Maternal Uncle    Alzheimer's disease Mother    Healthy Father    Healthy Sister    Healthy Sister    Healthy Sister    Heart disease Brother     Social History:  reports that he quit smoking about 53 years ago. His smoking use included cigars. He has never used smokeless tobacco. He reports that he does not currently use alcohol. He reports that he does not use drugs.   Physical Exam: Constitutional:  Alert and oriented, No acute distress. HEENT: [place] AT, moist mucus membranes.  Trachea midline Cardiovascular: No clubbing, cyanosis, or edema. Respiratory:  Normal respiratory effort, no increased work of breathing. GI: Abdomen is soft, nontender, nondistended, no abdominal masses GU: No CVA tenderness Lymph: No cervical or inguinal lymphadenopathy. Skin: No rashes, bruises or suspicious lesions. Neurologic: Grossly intact, no focal deficits, moving all 4 extremities. Psychiatric: Normal mood and affect.  Laboratory Data: N/A  Pertinent Imaging:  08/26/21 14:40  Scan Result    Assessment & Plan:    1. Urinary retention -Failed second voiding trial -Patient has been on finasteride since 2019 and alpha blockers for few weeks now without improvement in his urinary retention -We will schedule patient for cystoscopy at this time for further evaluation of B00  -RUS pending -I have explained to the patient that they will  be scheduled for a cystoscopy in our office to evaluate their bladder.  The cystoscopy consists of passing a tube with a lens up through their urethra and into their urinary bladder.   We will inject the urethra with a lidocaine gel prior to introducing the cystoscope to help with any discomfort during the procedure.   After the procedure, they might experience blood in the urine and discomfort with urination.  This will abate after the first few voids.  I have  encouraged the patient to increase water intake  during this time.  Patient denies any allergies to lidocaine.    2. BPH with LUTS -PVR > 300 cc -most bothersome symptoms are retention -continue conservative management, avoiding bladder irritants and timed voiding's -Continue Rapaflo 8 mg daily and finasteride 5 mg daily   Return for cysto for BOO .  [facility] [address] [place], [place] [address] [phone]

## 2021-09-15 ENCOUNTER — Ambulatory Visit
Admission: RE | Admit: 2021-09-15 | Discharge: 2021-09-15 | Disposition: A | Discharge status: Home / Self Care | Payer: Medicare Other | Source: Ambulatory Visit | Attending: Urology | Admitting: Urology

## 2021-09-15 DIAGNOSIS — N390 Urinary tract infection, site not specified: Secondary | ICD-10-CM | POA: Diagnosis present

## 2021-09-16 ENCOUNTER — Encounter: Payer: Self-pay | Admitting: Urology

## 2021-09-23 ENCOUNTER — Other Ambulatory Visit: Payer: Medicare Other | Admitting: Urology

## 2021-09-25 ENCOUNTER — Emergency Department
Admission: EM | Admit: 2021-09-25 | Discharge: 2021-09-25 | Disposition: A | Discharge status: Skilled Nursing Facility | Payer: Medicare Other | Attending: Emergency Medicine | Admitting: Emergency Medicine

## 2021-09-25 ENCOUNTER — Encounter: Payer: Self-pay | Admitting: Emergency Medicine

## 2021-09-25 ENCOUNTER — Emergency Department: Payer: Medicare Other

## 2021-09-25 ENCOUNTER — Other Ambulatory Visit: Payer: Self-pay

## 2021-09-25 DIAGNOSIS — T83511A Infection and inflammatory reaction due to indwelling urethral catheter, initial encounter: Secondary | ICD-10-CM | POA: Insufficient documentation

## 2021-09-25 DIAGNOSIS — R791 Abnormal coagulation profile: Secondary | ICD-10-CM | POA: Insufficient documentation

## 2021-09-25 DIAGNOSIS — R4182 Altered mental status, unspecified: Secondary | ICD-10-CM | POA: Diagnosis present

## 2021-09-25 DIAGNOSIS — I480 Paroxysmal atrial fibrillation: Secondary | ICD-10-CM | POA: Diagnosis not present

## 2021-09-25 DIAGNOSIS — F0390 Unspecified dementia without behavioral disturbance: Secondary | ICD-10-CM | POA: Diagnosis not present

## 2021-09-25 DIAGNOSIS — Z7902 Long term (current) use of antithrombotics/antiplatelets: Secondary | ICD-10-CM | POA: Diagnosis not present

## 2021-09-25 DIAGNOSIS — N390 Urinary tract infection, site not specified: Secondary | ICD-10-CM

## 2021-09-25 DIAGNOSIS — Z20822 Contact with and (suspected) exposure to covid-19: Secondary | ICD-10-CM | POA: Diagnosis not present

## 2021-09-25 DIAGNOSIS — Y732 Prosthetic and other implants, materials and accessory gastroenterology and urology devices associated with adverse incidents: Secondary | ICD-10-CM | POA: Insufficient documentation

## 2021-09-25 LAB — CBC WITH DIFFERENTIAL/PLATELET
Abs Immature Granulocytes: 0.04 10*3/uL (ref 0.00–0.07)
Basophils Absolute: 0.1 10*3/uL (ref 0.0–0.1)
Basophils Relative: 1 %
Eosinophils Absolute: 0.4 10*3/uL (ref 0.0–0.5)
Eosinophils Relative: 4 %
HCT: 34.2 % — ABNORMAL LOW (ref 39.0–52.0)
Hemoglobin: 11.5 g/dL — ABNORMAL LOW (ref 13.0–17.0)
Immature Granulocytes: 1 %
Lymphocytes Relative: 16 %
Lymphs Abs: 1.3 10*3/uL (ref 0.7–4.0)
MCH: 31 pg (ref 26.0–34.0)
MCHC: 33.6 g/dL (ref 30.0–36.0)
MCV: 92.2 fL (ref 80.0–100.0)
Monocytes Absolute: 0.6 10*3/uL (ref 0.1–1.0)
Monocytes Relative: 7 %
Neutro Abs: 5.7 10*3/uL (ref 1.7–7.7)
Neutrophils Relative %: 71 %
Platelets: 296 10*3/uL (ref 150–400)
RBC: 3.71 MIL/uL — ABNORMAL LOW (ref 4.22–5.81)
RDW: 13.4 % (ref 11.5–15.5)
WBC: 8 10*3/uL (ref 4.0–10.5)
nRBC: 0 % (ref 0.0–0.2)

## 2021-09-25 LAB — URINALYSIS, ROUTINE W REFLEX MICROSCOPIC
Bilirubin Urine: NEGATIVE
Glucose, UA: NEGATIVE mg/dL
Ketones, ur: NEGATIVE mg/dL
Nitrite: NEGATIVE
Protein, ur: NEGATIVE mg/dL
Specific Gravity, Urine: 1.004 — ABNORMAL LOW (ref 1.005–1.030)
Squamous Epithelial / HPF: NONE SEEN (ref 0–5)
pH: 7 (ref 5.0–8.0)

## 2021-09-25 LAB — COMPREHENSIVE METABOLIC PANEL
ALT: 14 U/L (ref 0–44)
AST: 22 U/L (ref 15–41)
Albumin: 3.4 g/dL — ABNORMAL LOW (ref 3.5–5.0)
Alkaline Phosphatase: 104 U/L (ref 38–126)
Anion gap: 7 (ref 5–15)
BUN: 16 mg/dL (ref 8–23)
CO2: 24 mmol/L (ref 22–32)
Calcium: 8.6 mg/dL — ABNORMAL LOW (ref 8.9–10.3)
Chloride: 106 mmol/L (ref 98–111)
Creatinine, Ser: 1.08 mg/dL (ref 0.61–1.24)
GFR, Estimated: >60 mL/min (ref >60)
Glucose, Bld: 113 mg/dL — ABNORMAL HIGH (ref 70–99)
Potassium: 3.9 mmol/L (ref 3.5–5.1)
Sodium: 137 mmol/L (ref 135–145)
Total Bilirubin: 0.6 mg/dL (ref 0.3–1.2)
Total Protein: 7.6 g/dL (ref 6.5–8.1)

## 2021-09-25 LAB — LIPASE, BLOOD: Lipase: 30 U/L (ref 11–51)

## 2021-09-25 LAB — PROTIME-INR
INR: 1.4 — ABNORMAL HIGH (ref 0.8–1.2)
Prothrombin Time: 16.7 seconds — ABNORMAL HIGH (ref 11.4–15.2)

## 2021-09-25 LAB — ETHANOL: Alcohol, Ethyl (B): <10 mg/dL (ref <10)

## 2021-09-25 LAB — SARS CORONAVIRUS 2 BY RT PCR: SARS Coronavirus 2 by RT PCR: NEGATIVE

## 2021-09-25 MED ORDER — CEFDINIR 300 MG PO CAPS: 300.0000 mg | ORAL_CAPSULE | Freq: Two times a day (BID) | ORAL | 0 refills | Status: DC

## 2021-09-25 MED ORDER — SODIUM CHLORIDE 0.9 % IV SOLN
2.0000 g | Freq: Once | INTRAVENOUS | Status: AC
  Administered 2021-09-25: 2 g via INTRAVENOUS
  Filled 2021-09-25: qty 20

## 2021-09-25 MED ORDER — RISPERIDONE 1 MG PO TABS
0.5000 mg | ORAL_TABLET | Freq: Once | ORAL | Status: AC
  Administered 2021-09-25: 0.5 mg via ORAL
  Filled 2021-09-25: qty 1

## 2021-09-25 NOTE — ED Triage Notes (Signed)
Pt arrives via [facility] from [facility].  Per EMS pt is altered from baseline per family. Pt oriented to self, able to state correct birthdate but not able to tell where he is.  Pt denies pain at this time.

## 2021-09-25 NOTE — ED Provider Notes (Signed)
[facility] Provider Note    Event Date/Time   First MD Initiated Contact with Patient 09/25/21 1951     (approximate)   History   Chief Complaint: Altered Mental Status   HPI  Erik Larson is a 88 y.o. male with a history of diverticulitis, atrial fibrillation on warfarin, GERD, indwelling Foley catheter which was initiated Jul 24, 2021 who is sent to the ED from SNF today due to worsening confusion compared to baseline.  Patient is oriented to self.  He does have a history of moderate dementia, but family had noticed a change.  Patient denies any pain or other acute complaints, no fever.     Physical Exam   Triage Vital Signs: ED Triage Vitals  Enc Vitals Group     BP 09/25/21 1957 (!) 151/66     Pulse Rate 09/25/21 1957 91     Resp 09/25/21 1957 11     Temp 09/25/21 1957 98.5 F (36.9 C)     Temp Source 09/25/21 1957 Oral     SpO2 09/25/21 1953 97 %     Weight 09/25/21 1958 162 lb 3.2 oz (73.6 kg)     Height 09/25/21 1958 5\' 10"  (1.778 m)     Head Circumference --      Peak Flow --      Pain Score 09/25/21 1958 0     Pain Loc --      Pain Edu? --      Excl. in GC? --     Most recent vital signs: Vitals:   09/25/21 1957 09/25/21 2109  BP: (!) 151/66 (!) 164/94  Pulse: 91 87  Resp: 11 20  Temp: 98.5 F (36.9 C)   SpO2: 99% 100%    General: Awake, no distress.  CV:  Good peripheral perfusion.  Regular rate and rhythm.  Normal symmetric pulses Resp:  Normal effort.  Clear to auscultation bilaterally Abd:  No distention.  Soft nontender Other:  Moist oral mucosa.  No soft tissue inflammatory changes, no wounds.  Full range of motion.  Fluent speech and language.  No focal neurodeficits.  Normal genitalia.  Foley catheter present on arrival.   ED Results / Procedures / Treatments   Labs (all labs ordered are listed, but only abnormal results are displayed) Labs Reviewed  COMPREHENSIVE METABOLIC PANEL - Abnormal; Notable for the  following components:      Result Value   Glucose, Bld 113 (*)    Calcium 8.6 (*)    Albumin 3.4 (*)    All other components within normal limits  CBC WITH DIFFERENTIAL/PLATELET - Abnormal; Notable for the following components:   RBC 3.71 (*)    Hemoglobin 11.5 (*)    HCT 34.2 (*)    All other components within normal limits  PROTIME-INR - Abnormal; Notable for the following components:   Prothrombin Time 16.7 (*)    INR 1.4 (*)    All other components within normal limits  URINALYSIS, ROUTINE W REFLEX MICROSCOPIC - Abnormal; Notable for the following components:   Color, Urine STRAW (*)    APPearance HAZY (*)    Specific Gravity, Urine 1.004 (*)    Hgb urine dipstick SMALL (*)    Leukocytes,Ua LARGE (*)    Bacteria, UA RARE (*)    All other components within normal limits  URINE CULTURE  CULTURE, BLOOD (ROUTINE X 2)  CULTURE, BLOOD (ROUTINE X 2)  SARS CORONAVIRUS 2 BY RT PCR  ETHANOL  LIPASE, BLOOD     EKG Interpreted by me Atrial fibrillation, rate of 98.  Left axis, normal intervals.  Regular rate block.  No acute ischemic changes.   RADIOLOGY Chest x-ray interpreted by me, appears normal.  Radiology report reviewed.  CT head negative for ICH or other acute findings.   PROCEDURES:  Procedures   MEDICATIONS ORDERED IN ED: Medications  cefTRIAXone (ROCEPHIN) 2 g in sodium chloride 0.9 % 100 mL IVPB (has no administration in time range)     IMPRESSION / MDM / ASSESSMENT AND PLAN / ED COURSE  I reviewed the triage vital signs and the nursing notes.                              Differential diagnosis includes, but is not limited to, AKI, electrolyte abnormality, aspiration pneumonia, UTI, intracranial hemorrhage, brain mass  Patient's presentation is most consistent with acute presentation with potential threat to life or bodily function.  Patient presents with change in mental status from baseline of chronic dementia.  No focal complaints.  Vital signs  are unremarkable, exam is nonfocal.  Due to limitations in data gathering from history and physical secondary to patient's altered mental status, work-up undertaken to look for occult pathology.  Serum labs unremarkable.  Urinalysis shows signs of cystitis.  Chest x-ray and CT head and EKG are unremarkable.  Labs remained stable in the ED.  Overall, evaluation is reassuring.  We will give the patient a dose of ceftriaxone in the ED, send urine culture, start [facility] and recommend outpatient follow-up.  Reviewing outside records, patient was referred to urology but does not appear to have followed up with urology yet.  I am providing information for local urology in [place] as well as the [facility] urology clinic that he was referred to.  He is not septic.  I think he can be return to SNF and receive appropriate care there given the overall reassuring results here today.       FINAL CLINICAL IMPRESSION(S) / ED DIAGNOSES   Final diagnoses:  Urinary tract infection associated with indwelling urethral catheter, initial encounter (HCC)  Chronic dementia (HCC)     Rx / DC Orders   ED Discharge Orders          Ordered    cefdinir (OMNICEF) 300 MG capsule  2 times daily        09/25/21 2106             Note:  This document was prepared using Dragon voice recognition software and may include unintentional dictation errors.   Sharman Cheek, MD 09/25/21 2114

## 2021-09-25 NOTE — ED Notes (Signed)
Pt pulled off hub of Iv, monitoring equipment, displaying much more confusion and trying to get up.EDP [place] notified.

## 2021-09-25 NOTE — ED Notes (Signed)
[facility] called for transport to [facility] 

## 2021-09-25 NOTE — Discharge Instructions (Addendum)
Your urine test today shows signs of a bladder infection.  We have started a urine culture in the lab, which will provide results in about 2 days.  We gave you a antibiotics in the emergency department today, and you should continue taking Omnicef over the next week to clear this infection.    Please follow-up with your primary care doctor this week for continued monitoring of your symptoms.  Call [facility] urology to schedule an appointment.  You can also see a urologist in [place] if you prefer.  [facility] [place]   [address]   [place], [place] [address]   [phone]

## 2021-09-27 LAB — URINE CULTURE

## 2021-09-30 LAB — CULTURE, BLOOD (ROUTINE X 2)
Culture: NO GROWTH
Culture: NO GROWTH
Special Requests: ADEQUATE
Special Requests: ADEQUATE

## 2021-10-04 ENCOUNTER — Ambulatory Visit (INDEPENDENT_AMBULATORY_CARE_PROVIDER_SITE_OTHER): Payer: Medicare Other | Admitting: Urology

## 2021-10-04 ENCOUNTER — Encounter: Payer: Self-pay | Admitting: Urology

## 2021-10-04 VITALS — BP 108/58 | HR 71 | Ht 67.0 in | Wt 166.0 lb

## 2021-10-04 DIAGNOSIS — N401 Enlarged prostate with lower urinary tract symptoms: Secondary | ICD-10-CM | POA: Diagnosis not present

## 2021-10-04 DIAGNOSIS — R339 Retention of urine, unspecified: Secondary | ICD-10-CM | POA: Diagnosis not present

## 2021-10-04 MED ORDER — LEVOFLOXACIN 500 MG PO TABS
500.0000 mg | ORAL_TABLET | Freq: Once | ORAL | Status: AC
  Administered 2021-10-04: 500 mg via ORAL

## 2021-10-04 NOTE — Progress Notes (Signed)
   10/04/21  CC:  Chief Complaint  Patient presents with   Cysto    HPI: Urinary retention, failed voiding trials x2.  RUS 7/5 showed no upper tract abnormalities.  A 4.5 x 2.3 x 2.1 cm heterogeneous lobulated soft tissue mass was seen on the right bladder wall  Blood pressure (!) 108/58, pulse 71, height 5\' 7"  (1.702 m), weight 166 lb (75.3 kg). NED. A&Ox3.   No respiratory distress   Abd soft, NT, ND Normal phallus with bilateral descended testicles  Cystoscopy Procedure Note  Patient identification was confirmed, informed consent was obtained, and patient was prepped using Betadine solution.  Lidocaine jelly was administered per urethral meatus.     Pre-Procedure: - Inspection reveals a normal caliber urethral meatus.  Procedure: The flexible cystoscope was introduced without difficulty - No urethral strictures/lesions are present. -Moderate lateral lobe enlargement prostate  -  Mild elevation  bladder neck - Bilateral ureteral orifices identified - Bladder mucosa  reveals inflammatory tissue bladder base right low wall more lined endoscopically with chronic indwelling Foley - No bladder stones   Retroflexion shows no abnormalities   Post-Procedure: - Patient tolerated the procedure well  Assessment/ Plan: BPH with urinary retention Ultrasound findings endoscopically consistent with inflammatory tissue secondary to indwelling Foley.  Urine cytology was sent.  They will be notified with results and further recommendations We also discussed possibility of urinary retention secondary to bladder hypotonicity.  Options of outlet procedure versus chronic indwelling Foley versus suprapubic tube placement discussed.  His daughter would be leaning toward suprapubic tube placement based on comorbidities.    , MD

## 2021-10-04 NOTE — Addendum Note (Signed)
Addended by: Honor Loh on: 10/04/2021 03:46 PM   Modules accepted: Orders

## 2021-10-04 NOTE — Progress Notes (Signed)
Catheter Removal  Patient is present today for a catheter removal.  40ml of water was drained from the balloon. A 18FR foley cath was removed from the bladder no complications were noted . Patient tolerated well.  Performed by: Teressa Lower, CMA   Simple Catheter Placement  Due to urinary retention patient is present today for a foley cath placement.  Patient was cleaned and prepped in a sterile fashion with betadine. A 18 FR foley catheter was inserted, urine return was noted  , urine was yellow in color.  The balloon was filled with 10cc of sterile water.  A night bag was attached for drainage.  Patient was given instruction on proper catheter care.  Patient tolerated well, no complications were noted   Performed by: Teressa Lower, CMA

## 2021-10-06 LAB — CYTOLOGY - NON PAP

## 2021-10-07 ENCOUNTER — Encounter: Payer: Self-pay | Admitting: *Deleted

## 2021-10-20 DIAGNOSIS — I10 Essential (primary) hypertension: Secondary | ICD-10-CM | POA: Diagnosis present

## 2021-10-20 DIAGNOSIS — N401 Enlarged prostate with lower urinary tract symptoms: Secondary | ICD-10-CM | POA: Diagnosis present

## 2021-11-08 ENCOUNTER — Telehealth: Payer: Self-pay

## 2021-11-08 NOTE — Telephone Encounter (Signed)
Left patient's daughter a message to call back for clarification if patient is still to be scheduled for SPT tube, and who is currently managing his warfarin prescription.

## 2021-11-08 NOTE — Telephone Encounter (Signed)
Patient daughter states they are still trying to decide if they are going to do the SPT.

## 2021-12-18 ENCOUNTER — Other Ambulatory Visit: Payer: Self-pay

## 2021-12-18 ENCOUNTER — Emergency Department
Admission: EM | Admit: 2021-12-18 | Discharge: 2021-12-18 | Disposition: A | Discharge status: Home / Self Care | Payer: Medicare Other | Attending: Emergency Medicine | Admitting: Emergency Medicine

## 2021-12-18 DIAGNOSIS — N3001 Acute cystitis with hematuria: Secondary | ICD-10-CM | POA: Diagnosis not present

## 2021-12-18 DIAGNOSIS — N390 Urinary tract infection, site not specified: Secondary | ICD-10-CM

## 2021-12-18 DIAGNOSIS — R1030 Lower abdominal pain, unspecified: Secondary | ICD-10-CM | POA: Diagnosis present

## 2021-12-18 LAB — URINALYSIS, ROUTINE W REFLEX MICROSCOPIC
Bilirubin Urine: NEGATIVE
Glucose, UA: NEGATIVE mg/dL
Ketones, ur: NEGATIVE mg/dL
Nitrite: NEGATIVE
Protein, ur: 30 mg/dL — AB
Specific Gravity, Urine: 1.016 (ref 1.005–1.030)
pH: 6 (ref 5.0–8.0)

## 2021-12-18 LAB — CBC
HCT: 35.6 % — ABNORMAL LOW (ref 39.0–52.0)
Hemoglobin: 11.5 g/dL — ABNORMAL LOW (ref 13.0–17.0)
MCH: 30.7 pg (ref 26.0–34.0)
MCHC: 32.3 g/dL (ref 30.0–36.0)
MCV: 95.2 fL (ref 80.0–100.0)
Platelets: 274 10*3/uL (ref 150–400)
RBC: 3.74 MIL/uL — ABNORMAL LOW (ref 4.22–5.81)
RDW: 13.5 % (ref 11.5–15.5)
WBC: 7.5 10*3/uL (ref 4.0–10.5)
nRBC: 0 % (ref 0.0–0.2)

## 2021-12-18 LAB — BASIC METABOLIC PANEL
Anion gap: 4 — ABNORMAL LOW (ref 5–15)
BUN: 16 mg/dL (ref 8–23)
CO2: 27 mmol/L (ref 22–32)
Calcium: 8.6 mg/dL — ABNORMAL LOW (ref 8.9–10.3)
Chloride: 110 mmol/L (ref 98–111)
Creatinine, Ser: 0.92 mg/dL (ref 0.61–1.24)
GFR, Estimated: >60 mL/min (ref >60)
Glucose, Bld: 103 mg/dL — ABNORMAL HIGH (ref 70–99)
Potassium: 4.2 mmol/L (ref 3.5–5.1)
Sodium: 141 mmol/L (ref 135–145)

## 2021-12-18 LAB — PROTIME-INR
INR: 3.3 — ABNORMAL HIGH (ref 0.8–1.2)
Prothrombin Time: 33 seconds — ABNORMAL HIGH (ref 11.4–15.2)

## 2021-12-18 MED ORDER — CEPHALEXIN 500 MG PO CAPS: 500.0000 mg | ORAL_CAPSULE | Freq: Three times a day (TID) | ORAL | 0 refills | Status: AC

## 2021-12-18 NOTE — ED Triage Notes (Signed)
Pt to ED via [facility] from [facility]. Facility reports bleeding around catheter site. No blood noted in urine bag. Pt on coumadin. Pt on low dose antibiotic. Pt catheter placed years ago for urinary retention and UTIs. Pt denies pain.

## 2021-12-18 NOTE — ED Triage Notes (Signed)
Pt BIB EMS from [facility] for bleeding around his urinary catheter. Pt does take coumadin. Pt endorses groin pain. Pt has had catheter for a few months. Pt is HOH and vision impaired. Pt also has dementia.

## 2021-12-18 NOTE — ED Notes (Addendum)
Spoke again with Barrister's clerk at [facility] and daughter again. Also informed that Coumadin level was elevated and per AVS will need to skip 1 dose. Daughter will pick up rx and bring to [facility]. Confirmed with staff that this is okay and they said yes. EMS transport being arranged. Pt repositioned in bed with bed alarm on.

## 2021-12-18 NOTE — ED Provider Notes (Signed)
[facility] Provider Note    Event Date/Time   First MD Initiated Contact with Patient 12/18/21 1333     (approximate)   History   Urinary Catheter Site Bleeding    HPI  Erik Larson is a 88 y.o. male who was sent from [facility] because he reportedly had bleeding from around his urinary catheter.  Here patient says he is feeling fine and does not have any problems.  He apparently did say he had groin pain previously.      Physical Exam   Triage Vital Signs: ED Triage Vitals [12/18/21 1253]  Enc Vitals Group     BP (!) 103/58     Pulse Rate 74     Resp 18     Temp 97.7 F (36.5 C)     Temp Source Oral     SpO2 99 %     Weight      Height      Head Circumference      Peak Flow      Pain Score 0     Pain Loc      Pain Edu?      Excl. in [place]?     Most recent vital signs: Vitals:   12/18/21 1253  BP: (!) 103/58  Pulse: 74  Resp: 18  Temp: 97.7 F (36.5 C)  SpO2: 99%     General: Awake, no distress.  CV:  Good peripheral perfusion.  Heart irregularly irregular rhythm no audible murmurs Resp:  Normal effort.  Lungs are clear Abd:  No distention.  Soft and nontender no organomegaly palpated GU: Normal penis there is no bleeding catheter looks intact there is some blood on the diaper it looks like he did have bleeding earlier.  But again there is no dried blood visible on the penis or the catheter.  The anterior scrotum looks okay.  I do not see any rashes or any other site of bleeding.   ED Results / Procedures / Treatments   Labs (all labs ordered are listed, but only abnormal results are displayed) Labs Reviewed  URINALYSIS, ROUTINE W REFLEX MICROSCOPIC - Abnormal; Notable for the following components:      Result Value   Color, Urine YELLOW (*)    APPearance HAZY (*)    Hgb urine dipstick SMALL (*)    Protein, ur 30 (*)    Leukocytes,Ua SMALL (*)    Bacteria, UA MANY (*)    All other components within normal limits   CBC - Abnormal; Notable for the following components:   RBC 3.74 (*)    Hemoglobin 11.5 (*)    HCT 35.6 (*)    All other components within normal limits  BASIC METABOLIC PANEL - Abnormal; Notable for the following components:   Glucose, Bld 103 (*)    Calcium 8.6 (*)    Anion gap 4 (*)    All other components within normal limits  PROTIME-INR - Abnormal; Notable for the following components:   Prothrombin Time 33.0 (*)    INR 3.3 (*)    All other components within normal limits     EKG     RADIOLOGY    PROCEDURES:  Critical Care performed:   Procedures   MEDICATIONS ORDERED IN ED: Medications - No data to display   IMPRESSION / MDM / [place] / ED COURSE  I reviewed the triage vital signs and the nursing notes. Patient's INR is 3.3.  It is  slightly high.  We will get a UA and see what that looks like.  Patient's blood pressure is about the same as last outpatient visit. Urinalysis returns looks like a UTI.  I will treat him with Keflex.  He has had cephalosporins before without issue.  This would explain the patient's blood in his urine.  I will have him hold the Coumadin for 1 day and then restart have him follow-up with his doctor and make sure the UTI is getting better and the Coumadin level is appropriate he may need his doses change possibly. Differential diagnosis includes, but is not limited to, erosion from the Foley, irritation from the Foley, UTI, hemorrhage from the high Coumadin level  Patient's presentation is most consistent with acute complicated illness / injury requiring diagnostic workup.  Patient looks and feels okay he says he feels okay.  He does not have any pain anywhere at this time.  I will let him go we will treat him for the UTI as I mention.     FINAL CLINICAL IMPRESSION(S) / ED DIAGNOSES   Final diagnoses:  Urinary tract infection with hematuria, site unspecified     Rx / DC Orders   ED Discharge Orders           Ordered    cephALEXin (KEFLEX) 500 MG capsule  3 times daily        12/18/21 1546             Note:  This document was prepared using Dragon voice recognition software and may include unintentional dictation errors.   Arnaldo Natal, MD 12/18/21 [phone]

## 2021-12-18 NOTE — ED Notes (Signed)
Called daughter to give update.

## 2021-12-18 NOTE — Discharge Instructions (Addendum)
Your Coumadin level is a little bit high today.  I would skip the next dose of Coumadin and then restart your regular dose.  I would follow-up with your regular doctor in the next several days to make sure your Coumadin dose does not need to be adjusted.  You also have a UTI.  I have given you some Keflex 1 pill 3 times a day to treat this.  He can have your doctor check and make sure that the infection has resolved afterwards.  Please return for any heavier bleeding or fever vomiting or other problems.

## 2022-03-05 ENCOUNTER — Encounter: Payer: Self-pay | Admitting: Emergency Medicine

## 2022-03-05 ENCOUNTER — Other Ambulatory Visit: Payer: Self-pay

## 2022-03-05 ENCOUNTER — Emergency Department
Admission: EM | Admit: 2022-03-05 | Discharge: 2022-03-05 | Disposition: A | Discharge status: Skilled Nursing Facility | Payer: Medicare Other | Attending: Emergency Medicine | Admitting: Emergency Medicine

## 2022-03-05 DIAGNOSIS — N390 Urinary tract infection, site not specified: Secondary | ICD-10-CM | POA: Diagnosis not present

## 2022-03-05 DIAGNOSIS — Z466 Encounter for fitting and adjustment of urinary device: Secondary | ICD-10-CM

## 2022-03-05 DIAGNOSIS — Z7901 Long term (current) use of anticoagulants: Secondary | ICD-10-CM | POA: Insufficient documentation

## 2022-03-05 DIAGNOSIS — T83511A Infection and inflammatory reaction due to indwelling urethral catheter, initial encounter: Secondary | ICD-10-CM | POA: Diagnosis not present

## 2022-03-05 DIAGNOSIS — T8384XA Pain from genitourinary prosthetic devices, implants and grafts, initial encounter: Secondary | ICD-10-CM | POA: Diagnosis not present

## 2022-03-05 LAB — CBC
HCT: 33.5 % — ABNORMAL LOW (ref 39.0–52.0)
Hemoglobin: 11.2 g/dL — ABNORMAL LOW (ref 13.0–17.0)
MCH: 31.5 pg (ref 26.0–34.0)
MCHC: 33.4 g/dL (ref 30.0–36.0)
MCV: 94.4 fL (ref 80.0–100.0)
Platelets: 290 10*3/uL (ref 150–400)
RBC: 3.55 MIL/uL — ABNORMAL LOW (ref 4.22–5.81)
RDW: 13.3 % (ref 11.5–15.5)
WBC: 10.2 10*3/uL (ref 4.0–10.5)
nRBC: 0 % (ref 0.0–0.2)

## 2022-03-05 LAB — BASIC METABOLIC PANEL
Anion gap: 9 (ref 5–15)
BUN: 19 mg/dL (ref 8–23)
CO2: 23 mmol/L (ref 22–32)
Calcium: 8.9 mg/dL (ref 8.9–10.3)
Chloride: 106 mmol/L (ref 98–111)
Creatinine, Ser: 1.28 mg/dL — ABNORMAL HIGH (ref 0.61–1.24)
GFR, Estimated: 52 mL/min — ABNORMAL LOW (ref >60)
Glucose, Bld: 111 mg/dL — ABNORMAL HIGH (ref 70–99)
Potassium: 4.5 mmol/L (ref 3.5–5.1)
Sodium: 138 mmol/L (ref 135–145)

## 2022-03-05 LAB — URINALYSIS, ROUTINE W REFLEX MICROSCOPIC
Bilirubin Urine: NEGATIVE
Glucose, UA: NEGATIVE mg/dL
Ketones, ur: NEGATIVE mg/dL
Nitrite: NEGATIVE
Protein, ur: 100 mg/dL — AB
RBC / HPF: >50 RBC/hpf — ABNORMAL HIGH (ref 0–5)
Specific Gravity, Urine: 1.017 (ref 1.005–1.030)
Squamous Epithelial / HPF: NONE SEEN (ref 0–5)
WBC, UA: >50 WBC/hpf — ABNORMAL HIGH (ref 0–5)
pH: 6 (ref 5.0–8.0)

## 2022-03-05 LAB — PROTIME-INR
INR: 2.5 — ABNORMAL HIGH (ref 0.8–1.2)
Prothrombin Time: 27 seconds — ABNORMAL HIGH (ref 11.4–15.2)

## 2022-03-05 MED ORDER — SODIUM CHLORIDE 0.9 % IV SOLN
1.0000 g | Freq: Once | INTRAVENOUS | Status: AC
  Administered 2022-03-05: 1 g via INTRAVENOUS
  Filled 2022-03-05: qty 10

## 2022-03-05 MED ORDER — SODIUM CHLORIDE 0.9 % IV BOLUS
1000.0000 mL | Freq: Once | INTRAVENOUS | Status: AC
  Administered 2022-03-05: 1000 mL via INTRAVENOUS

## 2022-03-05 MED ORDER — CEFPODOXIME PROXETIL 200 MG PO TABS: 200.0000 mg | ORAL_TABLET | Freq: Two times a day (BID) | ORAL | 0 refills | Status: AC

## 2022-03-05 MED ORDER — OXYCODONE-ACETAMINOPHEN 5-325 MG PO TABS
1.0000 | ORAL_TABLET | Freq: Once | ORAL | Status: AC
  Administered 2022-03-05: 1 via ORAL
  Filled 2022-03-05: qty 1

## 2022-03-05 NOTE — ED Notes (Signed)
Dr Larinda Buttery saw pt at bedside. Pt complains of penile pain and is tender to palpation in lower abdominal area. Pt is confused at baseline. Pt will have new foley placed after meds.

## 2022-03-05 NOTE — ED Notes (Signed)
Pt has attempted to get OOB several times. Bed alarm not working. Searching for working bed alarm and pt moved to room closer to nurses for safety reasons.

## 2022-03-05 NOTE — ED Notes (Signed)
Foley cath replaced. Tubing on other foley appeared clogged with small clots. Inserted 63fr coude. Pt states feels much better after urine output.

## 2022-03-05 NOTE — ED Notes (Signed)
Urine sent to lab 

## 2022-03-05 NOTE — ED Provider Notes (Signed)
[facility] Provider Note    Event Date/Time   First MD Initiated Contact with Patient 03/05/22 [phone]     (approximate)   History   Chief Complaint Catheter Issue   HPI  Erik Larson is a 88 y.o. male with past medical history of hyperlipidemia, atrial fibrillation on Coumadin, urinary retention with chronic Foley, and dementia who presents to the ED complaining of catheter issue.  Patient reports that he has been dealing with pain around his penis for the past few days.  He is not sure whether his Foley catheter has been working appropriately, deals with significant visual impairment.  He additionally complains of some pain in his suprapubic area, denies any fevers or flank pain.  He has not had any nausea, vomiting, or diarrhea.  Per EMS, patient's catheter was replaced by home health nurse a couple of days ago.     Physical Exam   Triage Vital Signs: ED Triage Vitals  Enc Vitals Group     BP 03/05/22 0352 139/73     Pulse Rate 03/05/22 0352 (!) 102     Resp 03/05/22 0352 18     Temp 03/05/22 0352 97.8 F (36.6 C)     Temp Source 03/05/22 0352 Oral     SpO2 03/05/22 0352 95 %     Weight 03/05/22 0359 165 lb 5.5 oz (75 kg)     Height 03/05/22 0359 5\' 7"  (1.702 m)     Head Circumference --      Peak Flow --      Pain Score --      Pain Loc --      Pain Edu? --      Excl. in [place]? --     Most recent vital signs: Vitals:   03/05/22 0352 03/05/22 0739  BP: 139/73 (!) 148/69  Pulse: (!) 102 88  Resp: 18 18  Temp: 97.8 F (36.6 C) (!) 97.5 F (36.4 C)  SpO2: 95% 97%    Constitutional: Awake and alert, appropriately interactive. Eyes: Conjunctivae are normal. Head: Atraumatic. Nose: No congestion/rhinnorhea. Mouth/Throat: Mucous membranes are moist.  Cardiovascular: Normal rate, regular rhythm. Grossly normal heart sounds.  2+ radial pulses bilaterally. Respiratory: Normal respiratory effort.  No retractions. Lungs CTAB. Gastrointestinal:  Soft and tender to palpation in the suprapubic area with no rebound or guarding.  No CVA tenderness bilaterally. No distention. Genitourinary: Foley catheter in place draining dark yellow urine. Musculoskeletal: No lower extremity tenderness nor edema.  Neurologic:  Normal speech and language. No gross focal neurologic deficits are appreciated.    ED Results / Procedures / Treatments   Labs (all labs ordered are listed, but only abnormal results are displayed) Labs Reviewed  BASIC METABOLIC PANEL - Abnormal; Notable for the following components:      Result Value   Glucose, Bld 111 (*)    Creatinine, Ser 1.28 (*)    GFR, Estimated 52 (*)    All other components within normal limits  CBC - Abnormal; Notable for the following components:   RBC 3.55 (*)    Hemoglobin 11.2 (*)    HCT 33.5 (*)    All other components within normal limits  URINALYSIS, ROUTINE W REFLEX MICROSCOPIC - Abnormal; Notable for the following components:   Color, Urine YELLOW (*)    APPearance HAZY (*)    Hgb urine dipstick LARGE (*)    Protein, ur 100 (*)    Leukocytes,Ua SMALL (*)    RBC /  HPF >50 (*)    WBC, UA >50 (*)    Bacteria, UA RARE (*)    All other components within normal limits  PROTIME-INR - Abnormal; Notable for the following components:   Prothrombin Time 27.0 (*)    INR 2.5 (*)    All other components within normal limits  URINE CULTURE    PROCEDURES:  Critical Care performed: No  Procedures   MEDICATIONS ORDERED IN ED: Medications  cefTRIAXone (ROCEPHIN) 1 g in sodium chloride 0.9 % 100 mL IVPB (1 g Intravenous New Bag/Given 03/05/22 0920)  sodium chloride 0.9 % bolus 1,000 mL (1,000 mLs Intravenous New Bag/Given 03/05/22 0919)  oxyCODONE-acetaminophen (PERCOCET/ROXICET) 5-325 MG per tablet 1 tablet (1 tablet Oral Given 03/05/22 0912)     IMPRESSION / MDM / ASSESSMENT AND PLAN / ED COURSE  I reviewed the triage vital signs and the nursing notes.                               88 y.o. male with past medical history of hyperlipidemia, atrial fibrillation on Coumadin, urinary retention with chronic Foley, and dementia who presents to the ED for discomfort around his Foley catheter as well as in his suprapubic area for the past couple of days.  Patient's presentation is most consistent with acute presentation with potential threat to life or bodily function.  Differential diagnosis includes, but is not limited to, obstructed Foley catheter, cystitis, pyelonephritis, AKI, electrolyte abnormality.  Patient nontoxic-appearing and in no acute distress, vital signs are unremarkable.  He appears at his baseline mental status, primarily complains of pain at his penis around the catheter as well as in his suprapubic area.  Urinalysis is concerning for infection, we will send for culture and replace his catheter.  Plan to treat with dose of Rocephin, will also hydrate with IV fluids given mild AKI.  No significant electrolyte abnormality, anemia, or leukocytosis noted.  Plan to treat pain with dose of Percocet and reassess following antibiotics.  If patient is feeling better, he would be appropriate for ongoing course of antibiotics at his nursing facility.  INR within therapeutic range, patient reports feeling much better following replacement of Foley catheter and dose of pain medication.  He is appropriate for discharge back to nursing facility, will be prescribed course of cefpodoxime for apparent UTI.  Patient counseled to return to the ED for new or worsening symptoms, patient agrees with plan.      FINAL CLINICAL IMPRESSION(S) / ED DIAGNOSES   Final diagnoses:  Urinary tract infection associated with indwelling urethral catheter, initial encounter (HCC)  Indwelling catheter replaced     Rx / DC Orders   ED Discharge Orders          Ordered    cefpodoxime (VANTIN) 200 MG tablet  2 times daily        03/05/22 0949             Note:  This document was  prepared using Dragon voice recognition software and may include unintentional dictation errors.   Chesley Noon, MD 03/05/22 [phone]

## 2022-03-05 NOTE — ED Triage Notes (Signed)
Pt to ED via EMS from [facility] c/o pain around catheter.  Per EMS catheter placed by home health nurse a couple days ago.  Pain when sitting.  Pt has hx of dementia, VSS per EMS.  Pt has urinary leg bag in place that is draining.

## 2022-03-07 LAB — URINE CULTURE: Culture: 70000 — AB

## 2022-03-08 NOTE — Progress Notes (Signed)
ED Antimicrobial Stewardship Positive Culture Follow Up   Erik Larson is an 88 y.o. male who presented to [facility] on 03/05/2022 with a chief complaint of pain around penis. Chief Complaint  Patient presents with   Catheter Issue    Recent Results (from the past 720 hour(s))  Urine Culture     Status: Abnormal   Collection Time: 03/05/22  4:02 AM   Specimen: Urine, Catheterized  Result Value Ref Range Status   Specimen Description   Final    URINE, CATHETERIZED Performed at [facility], [address] [place], [place] [address]    Special Requests   Final    NONE Performed at [facility], [address] [place], [place] [address]    Culture 70,000 COLONIES/mL PSEUDOMONAS PUTIDA (A)  Final   Report Status 03/07/2022 FINAL  Final   Organism ID, Bacteria PSEUDOMONAS PUTIDA (A)  Final      Susceptibility   Pseudomonas putida - MIC*    CEFTAZIDIME 2 SENSITIVE Sensitive     CIPROFLOXACIN 2 INTERMEDIATE Intermediate     GENTAMICIN <=1 SENSITIVE Sensitive     IMIPENEM 1 SENSITIVE Sensitive     PIP/TAZO 8 SENSITIVE Sensitive     * 70,000 COLONIES/mL PSEUDOMONAS PUTIDA    [x]  Treated with cefpodoxime, organism resistant to prescribed antimicrobial  ED provider did not wish to change antibiotic since culture was from indwelling catheter.  ED Provider: , MD   Minna Antis 03/08/2022, 11:11 AM Clinical Pharmacist

## 2022-05-27 ENCOUNTER — Encounter: Payer: Self-pay | Admitting: Urology

## 2022-05-27 ENCOUNTER — Telehealth: Payer: Self-pay | Admitting: Urology

## 2022-05-27 ENCOUNTER — Ambulatory Visit (INDEPENDENT_AMBULATORY_CARE_PROVIDER_SITE_OTHER): Payer: Medicare Other | Admitting: Urology

## 2022-05-27 VITALS — BP 142/60 | HR 82 | Ht 67.0 in | Wt 180.0 lb

## 2022-05-27 DIAGNOSIS — N368 Other specified disorders of urethra: Secondary | ICD-10-CM

## 2022-05-27 DIAGNOSIS — R339 Retention of urine, unspecified: Secondary | ICD-10-CM | POA: Diagnosis not present

## 2022-05-27 DIAGNOSIS — T8389XA Other specified complication of genitourinary prosthetic devices, implants and grafts, initial encounter: Secondary | ICD-10-CM

## 2022-05-27 NOTE — Telephone Encounter (Signed)
Spoke to the receptionist at [facility]. I requested to speak to the nurse in charge of patient. She needed to take a message. I gave her my name and call back number. He was seen in the office today and on the discharge note there was instructions about the jelly and his up coming need for a procedure.

## 2022-05-27 NOTE — Telephone Encounter (Signed)
[facility] [phone] RX for the cream needs to be faxed to [place] at [phone] that [facility] uses so they can get the medication and apply it. They did not have the fax number  They can not use the discharge notes and have to have an actual order/note stating what patient needs to use how often and that they can apply it for him. This needs to be signed by the provider.

## 2022-05-27 NOTE — Progress Notes (Signed)
05/27/2022 9:56 AM   Ty Hilts [birth year] [id number]  Referring provider: No referring provider defined for this encounter.  Chief Complaint  Patient presents with   Other    HPI: 89 y.o. male referred for "urethral tear".  He presents today with his daughter and granddaughter.  Chronic urinary retention managed with an indwelling Foley catheter I last saw him July 2023 and we discussed outlet procedures, chronic urethral Foley and SP tube placement Family elected indwelling Foley and he has recently developed urethral erosion.  Mild to moderate discomfort in this area His daughter is interested in pursuing placement of a suprapubic tube   PMH: Past Medical History:  Diagnosis Date   Arthritis    Atrial fibrillation ([place])    controlled on coumadin   GERD (gastroesophageal reflux disease)    Glaucoma    Gout    Heart disease    Hyperlipidemia     Surgical History: Past Surgical History:  Procedure Laterality Date   COLONOSCOPY  2005 ?   GLAUCOMA SURGERY      Home Medications:  Allergies as of 05/27/2022       Reactions   Ace Inhibitors    Penicillins         Medication List        Accurate as of May 27, 2022  9:56 AM. If you have any questions, ask your nurse or doctor.          allopurinol 100 MG tablet Commonly known as: ZYLOPRIM Take 1 tablet (100 mg total) by mouth 2 (two) times daily.   dorzolamide-timolol 2-0.5 % ophthalmic solution Commonly known as: COSOPT Apply 1 drop to eye as needed. Dr Atilano Median   finasteride 5 MG tablet Commonly known as: PROSCAR Take 1 tablet (5 mg total) by mouth daily.   latanoprost 0.005 % ophthalmic solution Commonly known as: XALATAN Apply 1 drop to eye as needed. Dr Atilano Median   melatonin 3 MG Tabs tablet Take 3 mg by mouth at bedtime as needed.   risperiDONE 0.5 MG tablet Commonly known as: RISPERDAL Take 0.5 mg by mouth at bedtime.   tamsulosin 0.4 MG Caps capsule Commonly known as:  FLOMAX Take 0.4 mg by mouth daily.   warfarin 6 MG tablet Commonly known as: COUMADIN TAKE 1 TABLET DAILY What changed:  how much to take when to take this additional instructions        Allergies:  Allergies  Allergen Reactions   Ace Inhibitors    Penicillins     Family History: Family History  Problem Relation Age of Onset   Cancer Maternal Uncle    Alzheimer's disease Mother    Healthy Father    Healthy Sister    Healthy Sister    Healthy Sister    Heart disease Brother     Social History:  reports that he quit smoking about 54 years ago. His smoking use included cigars. He has never used smokeless tobacco. He reports that he does not currently use alcohol. He reports that he does not use drugs.   Physical Exam: BP (!) 142/60   Pulse 82   Ht 5\' 7"  (1.702 m)   Wt 180 lb (81.6 kg)   BMI 28.19 kg/m   Constitutional:  Alert, No acute distress. HEENT: [place] AT Respiratory: Normal respiratory effort, no increased work of breathing. GU: Indwelling Foley catheter.  Subcoronal urethral erosion.   Assessment & Plan:    1.  Chronic urinary retention Chronic indwelling Foley with subcoronal  urethral erosion We discussed with an indwelling Foley this will most likely continue to worsen Agree with suprapubic tube placement and will schedule through IR He was having some discomfort after the exam and was provided with lidocaine gel to apply to this area   Abbie Sons, [place] [address], [place], [place] [address] [phone]

## 2022-05-27 NOTE — Telephone Encounter (Signed)
Patient's daughter called and requested that our office send an order for lidocaine gel to [facility] facility (where patient resides). She states that facility cannot apply gel without an order.

## 2022-05-30 MED ORDER — MUPIROCIN 2 % EX OINT: 1.0000 | TOPICAL_OINTMENT | Freq: Two times a day (BID) | CUTANEOUS | 0 refills | Status: AC

## 2022-05-30 NOTE — Telephone Encounter (Signed)
The lidocaine gel was intended as a one-time application due to discomfort from the exam.  Chronic use could result in absorption of lidocaine.    Recommend applying bacitracin ointment to meatus and catheter twice daily

## 2022-05-30 NOTE — Telephone Encounter (Addendum)
Spoke with dottie and sent Bactroban to [facility]  Daughter wanting to know when will he see IR for SPT placement.

## 2022-05-30 NOTE — Addendum Note (Signed)
Addended by: Despina Hidden on: 05/30/2022 03:48 PM   Modules accepted: Orders

## 2022-05-30 NOTE — Telephone Encounter (Signed)
Note/order made and faxed to [facility] I also called their facility and left message asking them if they will need RX sent in to [place] or if they have that on hand already.

## 2022-05-31 ENCOUNTER — Other Ambulatory Visit: Payer: Self-pay | Admitting: Family Medicine

## 2022-05-31 ENCOUNTER — Telehealth: Payer: Self-pay

## 2022-05-31 DIAGNOSIS — R339 Retention of urine, unspecified: Secondary | ICD-10-CM

## 2022-05-31 NOTE — Telephone Encounter (Signed)
I can put him on Tues 3/26 at 10a an arrive at [place]. The last day to take his Coumadin would be Thurs 3/21.  [facility] notified, appointment has been made.  His daughter [name] has been notified also.

## 2022-05-31 NOTE — Telephone Encounter (Signed)
Kayla Tinnen with [facility] called for this patient and left a voicemail asking that some one call her about upcoming procedure/SPT placement. CB# [phone].

## 2022-05-31 NOTE — Telephone Encounter (Signed)
Spoke to [place] and notified her I faxed the order form as well as the cardiac clearance for him to stop Coumadin. She voiced understanding.

## 2022-06-06 ENCOUNTER — Other Ambulatory Visit: Payer: Self-pay | Admitting: Student

## 2022-06-06 DIAGNOSIS — Z9189 Other specified personal risk factors, not elsewhere classified: Secondary | ICD-10-CM

## 2022-06-06 NOTE — Progress Notes (Signed)
Patient for IR SPT Placement on Tues 06/07/2022, I called and spoke with the patient's daughter, [name] on the phone and gave pre-procedure instructions. [name] was made aware to have the pt here at 9:30a, last dose of Coumadin was [facility] 3/21, NPO after MN prior to procedure as well as driver post procedure/recovery/discharge. [name] stated understanding.  Called 06/06/2022

## 2022-06-07 ENCOUNTER — Ambulatory Visit
Admission: RE | Admit: 2022-06-07 | Discharge: 2022-06-07 | Disposition: A | Discharge status: Home / Self Care | Payer: Medicare Other | Source: Ambulatory Visit | Attending: Urology | Admitting: Urology

## 2022-06-07 ENCOUNTER — Other Ambulatory Visit: Payer: Self-pay | Admitting: Urology

## 2022-06-07 DIAGNOSIS — R339 Retention of urine, unspecified: Secondary | ICD-10-CM | POA: Diagnosis not present

## 2022-06-07 DIAGNOSIS — N401 Enlarged prostate with lower urinary tract symptoms: Secondary | ICD-10-CM | POA: Diagnosis not present

## 2022-06-07 DIAGNOSIS — I4891 Unspecified atrial fibrillation: Secondary | ICD-10-CM | POA: Insufficient documentation

## 2022-06-07 DIAGNOSIS — Z7901 Long term (current) use of anticoagulants: Secondary | ICD-10-CM | POA: Insufficient documentation

## 2022-06-07 DIAGNOSIS — Z9189 Other specified personal risk factors, not elsewhere classified: Secondary | ICD-10-CM

## 2022-06-07 MED ORDER — FENTANYL CITRATE (PF) 100 MCG/2ML IJ SOLN
INTRAMUSCULAR | Status: AC | PRN
  Administered 2022-06-07 (×2): 25 ug via INTRAVENOUS

## 2022-06-07 MED ORDER — MIDAZOLAM HCL 2 MG/2ML IJ SOLN
INTRAMUSCULAR | Status: AC
  Filled 2022-06-07: qty 2

## 2022-06-07 MED ORDER — LIDOCAINE HCL 1 % IJ SOLN
INTRAMUSCULAR | Status: AC
  Administered 2022-06-07: 10 mL
  Filled 2022-06-07: qty 20

## 2022-06-07 MED ORDER — IOHEXOL 300 MG/ML  SOLN
5.0000 mL | Freq: Once | INTRAMUSCULAR | Status: AC | PRN
  Administered 2022-06-07: 5 mL

## 2022-06-07 MED ORDER — FENTANYL CITRATE (PF) 100 MCG/2ML IJ SOLN
INTRAMUSCULAR | Status: AC
  Filled 2022-06-07: qty 2

## 2022-06-07 MED ORDER — SODIUM CHLORIDE 0.9 % IV SOLN: INTRAVENOUS | Status: DC

## 2022-06-07 MED ORDER — MIDAZOLAM HCL 5 MG/5ML IJ SOLN
INTRAMUSCULAR | Status: AC | PRN
  Administered 2022-06-07: .5 mg via INTRAVENOUS

## 2022-06-07 NOTE — Progress Notes (Signed)
Patient has remained clinically stable post procedure. Taking po;s without difficulty, discharge instructions given to daughter with questions answered, instructions given to take back to facility with patient, to restart coumadin tomorrow, all other meds today.

## 2022-06-07 NOTE — Procedures (Signed)
Interventional Radiology Procedure Note  Date of Procedure: 06/07/2022  Procedure: SPT placement   Findings:  1. SPT placement 16 Fr to bag drain    Complications: No immediate complications noted.   Estimated Blood Loss: minimal  Follow-up and Recommendations: 1. Return to IR in 2-3 weeks for exchange to 16 Fr Foley    Albin Felling, MD  Vascular & Interventional Radiology  06/07/2022 11:35 AM

## 2022-06-07 NOTE — H&P (Signed)
Chief Complaint: Patient was seen in consultation today for urinary retention; suprapubic catheter placement  Referring Physician(s): Stoioff,Scott C  Supervising Physician: Juliet Rude  Patient Status: [facility] - Out-pt  History of Present Illness: Erik Larson is a 89 y.o. male with a medical history significant for atrial fibrillation (Coumadin), heart disease and BPH with urinary retention. He has been followed by Urology for several years and was hospitalized last year for UTI/Bacteremia and has required an indwelling foley catheter for urinary retention since that time. He has now developed urethral erosion.   Interventional Radiology has been asked to evaluate this patient for an image-guided suprapubic catheter placement.  Past Medical History:  Diagnosis Date   Arthritis    Atrial fibrillation ([place])    controlled on coumadin   GERD (gastroesophageal reflux disease)    Glaucoma    Gout    Heart disease    Hyperlipidemia     Past Surgical History:  Procedure Laterality Date   COLONOSCOPY  2005 ?   GLAUCOMA SURGERY      Allergies: Ace inhibitors and Penicillins  Medications: Prior to Admission medications   Medication Sig Start Date End Date Taking? Authorizing Provider  allopurinol (ZYLOPRIM) 100 MG tablet Take 1 tablet (100 mg total) by mouth 2 (two) times daily. 07/19/17   Juline Patch, MD  dorzolamide-timolol (COSOPT) 22.3-6.8 MG/ML ophthalmic solution Apply 1 drop to eye as needed. Dr Atilano Median    [provider]  finasteride (PROSCAR) 5 MG tablet Take 1 tablet (5 mg total) by mouth daily. 07/07/17   Juline Patch, MD  latanoprost (XALATAN) 0.005 % ophthalmic solution Apply 1 drop to eye as needed. Dr Atilano Median    [provider]  melatonin 3 MG TABS tablet Take 3 mg by mouth at bedtime as needed.    [provider]  mupirocin ointment (BACTROBAN) 2 % Apply 1 Application topically 2 (two) times daily. 05/30/22 06/29/22  Stoioff, Ronda Fairly, MD  risperiDONE (RISPERDAL) 0.5 MG tablet Take 0.5 mg by mouth at bedtime. 09/18/21   [provider]  tamsulosin (FLOMAX) 0.4 MG CAPS capsule Take 0.4 mg by mouth daily. 08/05/21   [provider]  warfarin (COUMADIN) 6 MG tablet TAKE 1 TABLET DAILY Patient taking differently: Take 3-6 mg by mouth as directed. Take 3 mg on Wednesday and Saturday Take 6 mg on Sunday, Monday,Tuesday, Thursday, and Friday 12/20/17   Juline Patch, MD     Family History  Problem Relation Age of Onset   Cancer Maternal Uncle    Alzheimer's disease Mother    Healthy Father    Healthy Sister    Healthy Sister    Healthy Sister    Heart disease Brother     Social History   Socioeconomic History   Marital status: Married    Spouse name: Press photographer   Number of children: 2   Years of education: Not on file   Highest education level: 10th grade  Occupational History   Occupation: Retired  Tobacco Use   Smoking status: Former    Types: Cigars    Quit date: 1970    Years since quitting: 54.2   Smokeless tobacco: Never   Tobacco comments:    smoking cessation materials not required  Vaping Use   Vaping Use: Never used  Substance and Sexual Activity   Alcohol use: Not Currently    Alcohol/week: 0.0 standard drinks of alcohol   Drug use: No   Sexual activity:  Not Currently  Other Topics Concern   Not on file  Social History Narrative   Not on file   Social Determinants of Health   Financial Resource Strain: Low Risk  (07/12/2017)   Overall Financial Resource Strain (CARDIA)    Difficulty of Paying Living Expenses: Not hard at all  Food Insecurity: No Food Insecurity (07/12/2017)   Hunger Vital Sign    Worried About Running Out of Food in the Last Year: Never true    Ran Out of Food in the Last Year: Never true  Transportation Needs: No Transportation Needs (07/12/2017)   PRAPARE - Hydrologist (Medical): No    Lack of Transportation (Non-Medical): No   Physical Activity: Inactive (07/12/2017)   Exercise Vital Sign    Days of Exercise per Week: 0 days    Minutes of Exercise per Session: 0 min  Stress: No Stress Concern Present (07/12/2017)   [place]    Feeling of Stress : Not at all  Social Connections: Unknown (07/12/2017)   Social Connection and Isolation Panel [NHANES]    Frequency of Communication with Friends and Family: Patient declined    Frequency of Social Gatherings with Friends and Family: Patient declined    Attends Religious Services: Patient declined    Marine scientist or Organizations: Patient declined    Attends Archivist Meetings: Patient declined    Marital Status: Married    Review of Systems: A 12 point ROS discussed and pertinent positives are indicated in the HPI above.  All other systems are negative.  Review of Systems  Constitutional:  Positive for fatigue. Negative for appetite change.  HENT:  Positive for hearing loss.   Respiratory:  Negative for cough and shortness of breath.   Cardiovascular:  Negative for chest pain and leg swelling.  Gastrointestinal:  Negative for abdominal pain, diarrhea, nausea and vomiting.  Neurological:  Negative for dizziness and headaches.    Vital Signs: BP (!) 149/84   Pulse 74   Temp 98.2 F (36.8 C) (Oral)   Resp 20   SpO2 98%   Physical Exam Constitutional:      General: He is not in acute distress.    Appearance: He is not ill-appearing.  HENT:     Mouth/Throat:     Mouth: Mucous membranes are moist.     Pharynx: Oropharynx is clear.  Cardiovascular:     Rate and Rhythm: Normal rate. Rhythm irregular.     Pulses: Normal pulses.     Heart sounds: Normal heart sounds.  Pulmonary:     Effort: Pulmonary effort is normal.     Breath sounds: Normal breath sounds.  Abdominal:     General: Bowel sounds are normal.     Palpations: Abdomen is soft.     Tenderness: There is no  abdominal tenderness.  Genitourinary:    Comments: Foley catheter.  Musculoskeletal:     Right lower leg: No edema.     Left lower leg: No edema.  Skin:    General: Skin is warm and dry.  Neurological:     Mental Status: He is alert and oriented to person, place, and time.     Imaging: No results found.  Labs:  CBC: Recent Labs    08/16/21 0624 09/25/21 2003 12/18/21 1303 03/05/22 0402  WBC 5.9 8.0 7.5 10.2  HGB 9.7* 11.5* 11.5* 11.2*  HCT 29.3* 34.2* 35.6* 33.5*  PLT  202 296 274 290    COAGS: Recent Labs    08/17/21 0556 09/25/21 2003 12/18/21 1303 03/05/22 0916  INR 1.3* 1.4* 3.3* 2.5*    BMP: Recent Labs    08/16/21 0624 09/25/21 2003 12/18/21 1303 03/05/22 0402  NA 139 137 141 138  K 3.6 3.9 4.2 4.5  CL 108 106 110 106  CO2 26 24 27 23   GLUCOSE 94 113* 103* 111*  BUN 16 16 16 19   CALCIUM 8.1* 8.6* 8.6* 8.9  CREATININE 0.83 1.08 0.92 1.28*  GFRNONAA >60 >60 >60 52*    LIVER FUNCTION TESTS: Recent Labs    08/15/21 0432 09/25/21 2003  BILITOT 0.6 0.6  AST 23 22  ALT 18 14  ALKPHOS 118 104  PROT 7.4 7.6  ALBUMIN 3.4* 3.4*    TUMOR MARKERS: No results for input(s): "AFPTM", "CEA", "CA199", "CHROMGRNA" in the last 8760 hours.  Assessment and Plan:  Urinary Retention: Reginal Lutes. Moren, 89 year old male, presents today to the [facility] Interventional Radiology department for an image-guided suprapubic catheter placement.  Risks and benefits discussed with the patient including bleeding, infection, damage to adjacent structures, and sepsis.  All of the patient's questions were answered, patient is agreeable to proceed. He has been NPO. He is a full code. Last dose of Coumadin was 06/01/22.  Consent signed and in chart.  Thank you for this interesting consult.  I greatly enjoyed meeting DWRIGHT HABEL and look forward to participating in their care.  A copy of this report was sent to the requesting provider on this date.  Electronically  Signed: Soyla Dryer, AGACNP-BC [phone] 06/07/2022, 10:30 AM   I spent a total of  30 Minutes   in face to face in clinical consultation, greater than 50% of which was counseling/coordinating care for suprapubic catheter placement.

## 2022-06-11 ENCOUNTER — Emergency Department: Payer: Medicare Other

## 2022-06-11 ENCOUNTER — Other Ambulatory Visit: Payer: Self-pay

## 2022-06-11 ENCOUNTER — Inpatient Hospital Stay
Admission: EM | Admit: 2022-06-11 | Discharge: 2022-06-14 | DRG: 698 | Disposition: A | Discharge status: Skilled Nursing Facility | Payer: Medicare Other | Source: Skilled Nursing Facility | Attending: Internal Medicine | Admitting: Internal Medicine

## 2022-06-11 DIAGNOSIS — H548 Legal blindness, as defined in USA: Secondary | ICD-10-CM | POA: Diagnosis present

## 2022-06-11 DIAGNOSIS — M109 Gout, unspecified: Secondary | ICD-10-CM | POA: Diagnosis present

## 2022-06-11 DIAGNOSIS — R791 Abnormal coagulation profile: Secondary | ICD-10-CM | POA: Diagnosis present

## 2022-06-11 DIAGNOSIS — Z888 Allergy status to other drugs, medicaments and biological substances status: Secondary | ICD-10-CM

## 2022-06-11 DIAGNOSIS — I2510 Atherosclerotic heart disease of native coronary artery without angina pectoris: Secondary | ICD-10-CM | POA: Diagnosis present

## 2022-06-11 DIAGNOSIS — Z880 Allergy status to penicillin: Secondary | ICD-10-CM

## 2022-06-11 DIAGNOSIS — B952 Enterococcus as the cause of diseases classified elsewhere: Secondary | ICD-10-CM | POA: Diagnosis present

## 2022-06-11 DIAGNOSIS — R54 Age-related physical debility: Secondary | ICD-10-CM | POA: Diagnosis present

## 2022-06-11 DIAGNOSIS — Z960 Presence of urogenital implants: Secondary | ICD-10-CM | POA: Diagnosis present

## 2022-06-11 DIAGNOSIS — F0392 Unspecified dementia, unspecified severity, with psychotic disturbance: Secondary | ICD-10-CM | POA: Diagnosis present

## 2022-06-11 DIAGNOSIS — Z79899 Other long term (current) drug therapy: Secondary | ICD-10-CM

## 2022-06-11 DIAGNOSIS — G9341 Metabolic encephalopathy: Secondary | ICD-10-CM | POA: Diagnosis present

## 2022-06-11 DIAGNOSIS — Y846 Urinary catheterization as the cause of abnormal reaction of the patient, or of later complication, without mention of misadventure at the time of the procedure: Secondary | ICD-10-CM | POA: Diagnosis present

## 2022-06-11 DIAGNOSIS — T83518A Infection and inflammatory reaction due to other urinary catheter, initial encounter: Principal | ICD-10-CM | POA: Diagnosis present

## 2022-06-11 DIAGNOSIS — D649 Anemia, unspecified: Secondary | ICD-10-CM | POA: Diagnosis present

## 2022-06-11 DIAGNOSIS — N401 Enlarged prostate with lower urinary tract symptoms: Secondary | ICD-10-CM | POA: Diagnosis present

## 2022-06-11 DIAGNOSIS — T83510A Infection and inflammatory reaction due to cystostomy catheter, initial encounter: Secondary | ICD-10-CM | POA: Diagnosis not present

## 2022-06-11 DIAGNOSIS — E785 Hyperlipidemia, unspecified: Secondary | ICD-10-CM | POA: Diagnosis present

## 2022-06-11 DIAGNOSIS — R410 Disorientation, unspecified: Secondary | ICD-10-CM

## 2022-06-11 DIAGNOSIS — I4891 Unspecified atrial fibrillation: Secondary | ICD-10-CM | POA: Diagnosis present

## 2022-06-11 DIAGNOSIS — R339 Retention of urine, unspecified: Secondary | ICD-10-CM | POA: Diagnosis present

## 2022-06-11 DIAGNOSIS — I4821 Permanent atrial fibrillation: Secondary | ICD-10-CM | POA: Diagnosis present

## 2022-06-11 DIAGNOSIS — K219 Gastro-esophageal reflux disease without esophagitis: Secondary | ICD-10-CM | POA: Diagnosis present

## 2022-06-11 DIAGNOSIS — Z7901 Long term (current) use of anticoagulants: Secondary | ICD-10-CM | POA: Diagnosis not present

## 2022-06-11 DIAGNOSIS — Z6828 Body mass index (BMI) 28.0-28.9, adult: Secondary | ICD-10-CM | POA: Diagnosis not present

## 2022-06-11 DIAGNOSIS — R4182 Altered mental status, unspecified: Secondary | ICD-10-CM | POA: Diagnosis present

## 2022-06-11 DIAGNOSIS — F0390 Unspecified dementia without behavioral disturbance: Secondary | ICD-10-CM | POA: Diagnosis not present

## 2022-06-11 DIAGNOSIS — N390 Urinary tract infection, site not specified: Secondary | ICD-10-CM | POA: Diagnosis present

## 2022-06-11 DIAGNOSIS — I10 Essential (primary) hypertension: Secondary | ICD-10-CM | POA: Diagnosis present

## 2022-06-11 DIAGNOSIS — R5381 Other malaise: Secondary | ICD-10-CM

## 2022-06-11 DIAGNOSIS — R443 Hallucinations, unspecified: Secondary | ICD-10-CM | POA: Diagnosis present

## 2022-06-11 LAB — COMPREHENSIVE METABOLIC PANEL
ALT: 14 U/L (ref 0–44)
AST: 26 U/L (ref 15–41)
Albumin: 3.3 g/dL — ABNORMAL LOW (ref 3.5–5.0)
Alkaline Phosphatase: 104 U/L (ref 38–126)
Anion gap: 7 (ref 5–15)
BUN: 21 mg/dL (ref 8–23)
CO2: 22 mmol/L (ref 22–32)
Calcium: 8.3 mg/dL — ABNORMAL LOW (ref 8.9–10.3)
Chloride: 105 mmol/L (ref 98–111)
Creatinine, Ser: 1.03 mg/dL (ref 0.61–1.24)
GFR, Estimated: >60 mL/min (ref >60)
Glucose, Bld: 172 mg/dL — ABNORMAL HIGH (ref 70–99)
Potassium: 4 mmol/L (ref 3.5–5.1)
Sodium: 134 mmol/L — ABNORMAL LOW (ref 135–145)
Total Bilirubin: 0.5 mg/dL (ref 0.3–1.2)
Total Protein: 7.2 g/dL (ref 6.5–8.1)

## 2022-06-11 LAB — CBC WITH DIFFERENTIAL/PLATELET
Abs Immature Granulocytes: 0.06 10*3/uL (ref 0.00–0.07)
Basophils Absolute: 0.1 10*3/uL (ref 0.0–0.1)
Basophils Relative: 1 %
Eosinophils Absolute: 0.2 10*3/uL (ref 0.0–0.5)
Eosinophils Relative: 2 %
HCT: 33.9 % — ABNORMAL LOW (ref 39.0–52.0)
Hemoglobin: 11.6 g/dL — ABNORMAL LOW (ref 13.0–17.0)
Immature Granulocytes: 1 %
Lymphocytes Relative: 11 %
Lymphs Abs: 1.2 10*3/uL (ref 0.7–4.0)
MCH: 32 pg (ref 26.0–34.0)
MCHC: 34.2 g/dL (ref 30.0–36.0)
MCV: 93.6 fL (ref 80.0–100.0)
Monocytes Absolute: 0.7 10*3/uL (ref 0.1–1.0)
Monocytes Relative: 7 %
Neutro Abs: 8.2 10*3/uL — ABNORMAL HIGH (ref 1.7–7.7)
Neutrophils Relative %: 78 %
Platelets: 270 10*3/uL (ref 150–400)
RBC: 3.62 MIL/uL — ABNORMAL LOW (ref 4.22–5.81)
RDW: 12.9 % (ref 11.5–15.5)
WBC: 10.5 10*3/uL (ref 4.0–10.5)
nRBC: 0 % (ref 0.0–0.2)

## 2022-06-11 LAB — URINALYSIS, ROUTINE W REFLEX MICROSCOPIC
Bilirubin Urine: NEGATIVE
Glucose, UA: NEGATIVE mg/dL
Ketones, ur: NEGATIVE mg/dL
Nitrite: NEGATIVE
Protein, ur: 100 mg/dL — AB
RBC / HPF: >50 RBC/hpf (ref 0–5)
Specific Gravity, Urine: 1.013 (ref 1.005–1.030)
pH: 6 (ref 5.0–8.0)

## 2022-06-11 LAB — LACTIC ACID, PLASMA: Lactic Acid, Venous: 1.9 mmol/L (ref 0.5–1.9)

## 2022-06-11 LAB — PROTIME-INR
INR: 1.4 — ABNORMAL HIGH (ref 0.8–1.2)
Prothrombin Time: 17.2 seconds — ABNORMAL HIGH (ref 11.4–15.2)

## 2022-06-11 MED ORDER — SODIUM CHLORIDE 0.9 % IV SOLN: INTRAVENOUS | Status: AC

## 2022-06-11 MED ORDER — ALLOPURINOL 100 MG PO TABS: 100.0000 mg | ORAL_TABLET | Freq: Two times a day (BID) | ORAL | Status: DC

## 2022-06-11 MED ORDER — SODIUM CHLORIDE 0.9% FLUSH
3.0000 mL | Freq: Two times a day (BID) | INTRAVENOUS | Status: DC
  Administered 2022-06-12 – 2022-06-14 (×4): 3 mL via INTRAVENOUS

## 2022-06-11 MED ORDER — FINASTERIDE 5 MG PO TABS
5.0000 mg | ORAL_TABLET | Freq: Every day | ORAL | Status: DC
  Administered 2022-06-12 – 2022-06-14 (×3): 5 mg via ORAL
  Filled 2022-06-11 (×3): qty 1

## 2022-06-11 MED ORDER — CEFTAZIDIME 1 G IJ SOLR: 1.0000 g | Freq: Once | INTRAMUSCULAR | Status: DC

## 2022-06-11 MED ORDER — MELATONIN 5 MG PO TABS
5.0000 mg | ORAL_TABLET | Freq: Every evening | ORAL | Status: DC | PRN
  Administered 2022-06-12: 5 mg via ORAL
  Filled 2022-06-11: qty 1

## 2022-06-11 MED ORDER — SODIUM CHLORIDE 0.9 % IV SOLN
1.0000 g | Freq: Once | INTRAVENOUS | Status: AC
  Administered 2022-06-11: 1 g via INTRAVENOUS
  Filled 2022-06-11: qty 1

## 2022-06-11 MED ORDER — TAMSULOSIN HCL 0.4 MG PO CAPS
0.4000 mg | ORAL_CAPSULE | Freq: Every day | ORAL | Status: DC
  Administered 2022-06-12 – 2022-06-14 (×3): 0.4 mg via ORAL
  Filled 2022-06-11 (×3): qty 1

## 2022-06-11 MED ORDER — ACETAMINOPHEN 650 MG RE SUPP: 650.0000 mg | Freq: Four times a day (QID) | RECTAL | Status: DC | PRN

## 2022-06-11 MED ORDER — LACTATED RINGERS IV BOLUS
1000.0000 mL | Freq: Once | INTRAVENOUS | Status: AC
  Administered 2022-06-11: 1000 mL via INTRAVENOUS

## 2022-06-11 MED ORDER — ACETAMINOPHEN 325 MG PO TABS: 650.0000 mg | ORAL_TABLET | Freq: Four times a day (QID) | ORAL | Status: DC | PRN

## 2022-06-11 MED ORDER — RISPERIDONE 0.5 MG PO TABS
0.5000 mg | ORAL_TABLET | Freq: Every day | ORAL | Status: DC
  Administered 2022-06-12 – 2022-06-13 (×3): 0.5 mg via ORAL
  Filled 2022-06-11 (×3): qty 1

## 2022-06-11 NOTE — ED Triage Notes (Signed)
Pt to ED via [facility] from [facility]. Per family and staff, pt has been altered and disoriented. Pt had catheter placed earlier this week. Pt had UTI diagnosed "about two weeks ago" per family and was compliant with medication. Pt has hx Afib.

## 2022-06-11 NOTE — Assessment & Plan Note (Signed)
    Latest Ref Rng & Units 06/11/2022    7:50 PM 03/05/2022    4:02 AM 12/18/2021    1:03 PM  CBC  WBC 4.0 - 10.5 K/uL 10.5  10.2  7.5   Hemoglobin 13.0 - 17.0 g/dL 11.6  11.2  11.5   Hematocrit 39.0 - 52.0 % 33.9  33.5  35.6   Platelets 150 - 400 K/uL 270  290  274   H&H stable with hemoglobin 11.6. Continue IV PPI.

## 2022-06-11 NOTE — ED Provider Notes (Signed)
[facility] Provider Note    Event Date/Time   First MD Initiated Contact with Patient 06/11/22 1934     (approximate)   History   Altered Mental Status   HPI  Erik Larson is a 89 y.o. 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  Patient resides in [place].  Facility was concerned that he was more disoriented today and was walking around [place].  Patient is denying complaints currently he denies headache chest pain dyspnea cough fevers chills abdominal pain nausea vomiting or issues with recent weight play suprapubic catheter. Poke with patient's daughter Erik Larson on the phone who notes that today he started having hallucinations and was disoriented.  He was seeing people and they were not there thought he was sitting on a chair when he was actually sitting on the air conditioning unit.  Patient tells me that at his baseline he is typically alert and oriented.  She says that he typically behaves this way when he gets a UTI which has been about monthly.     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   Interval gout 07/29/2014   Diverticulitis of colon 07/29/2014   Long term current use of anticoagulant therapy 07/29/2014   Atrial fibrillation, controlled ([place]) 07/29/2014   Acid reflux 07/29/2014    Warfarin therapy started 07/29/2014     Physical Exam  Triage Vital Signs: ED Triage Vitals  Enc Vitals Group     BP 06/11/22 1940 (!) 141/71     Pulse Rate 06/11/22 1940 97     Resp 06/11/22 1940 18     Temp 06/11/22 1939 98.6 F (37 C)     Temp Source 06/11/22 1939 Oral     SpO2 06/11/22 1940 95 %     Weight --      Height --      Head Circumference --      Peak Flow --      Pain Score 06/11/22 1940 0     Pain Loc --      Pain Edu? --      Excl. in [place]? --     Most recent vital signs: Vitals:   06/11/22 2100 06/11/22 2130  BP: (!) 142/84 (!) 144/87  Pulse: (!) 109 (!) 105  Resp: (!) 9 12  Temp:    SpO2: 98% 97%     General: Awake, no distress.  CV:  Good peripheral perfusion.  Resp:  Normal effort.  No increased work of breathing Abd:  No distention.  Soft nontender, suprapubic catheter in place draining dark yellow urine Neuro:             Awake, Alert, Oriented x 1-does not know that he is in the hospital but knows he is in [place] thinks it is 1994 Other:  Face is symmetric pupils equal reactive  equal strength in bilateral upper and lower extremities   ED Results / Procedures / Treatments  Labs (all labs ordered are listed, but only abnormal results are displayed) Labs Reviewed  COMPREHENSIVE METABOLIC PANEL - Abnormal; Notable for the following components:      Result Value   Sodium 134 (*)    Glucose, Bld 172 (*)    Calcium 8.3 (*)    Albumin 3.3 (*)    All other components within normal limits  CBC WITH DIFFERENTIAL/PLATELET - Abnormal; Notable for the following components:   RBC 3.62 (*)    Hemoglobin 11.6 (*)    HCT 33.9 (*)    Neutro Abs 8.2 (*)    All other components within normal limits  URINALYSIS, ROUTINE W REFLEX MICROSCOPIC - Abnormal; Notable for the following components:   Color, Urine YELLOW (*)    APPearance HAZY (*)    Hgb urine dipstick LARGE (*)    Protein, ur 100 (*)    Leukocytes,Ua MODERATE (*)    Bacteria, UA RARE (*)     All other components within normal limits  URINE CULTURE  CULTURE, BLOOD (ROUTINE X 2)  CULTURE, BLOOD (ROUTINE X 2)  PROTIME-INR  LACTIC ACID, PLASMA  LACTIC ACID, PLASMA     EKG  EKG reviewed inter myself shows atrial fibrillation with right bundle branch block no acute ischemic changes rate controlled   RADIOLOGY I reviewed and interpreted the CT scan of the brain which does not show any acute intracranial process    PROCEDURES:  Critical Care performed: No  .1-3 Lead EKG Interpretation  Performed by: Rada Hay, MD Authorized by: Rada Hay, MD     Interpretation: abnormal     ECG rate assessment: normal     Rhythm: atrial fibrillation     Ectopy: none     Conduction: normal     The patient is on the cardiac monitor to evaluate for evidence of arrhythmia and/or significant heart rate changes.   MEDICATIONS ORDERED IN ED: Medications  cefTAZidime (FORTAZ) injection 1 g (has no administration in time range)  lactated ringers bolus 1,000 mL (has no administration in time range)     IMPRESSION / MDM / ASSESSMENT AND PLAN / ED COURSE  I reviewed the triage vital signs and the nursing notes.                              Patient's presentation is most consistent with acute complicated illness / injury requiring diagnostic workup.  Differential diagnosis includes, but is not limited to, catheter associated UTI, dehydration, electrode abnormality, renal failure, intracranial hemorrhage, metabolic encephalopathy, polypharmacy/medication side effect  Patient is a 89 year old male with recently placed over the catheter who presents from [facility] range due to concern for change in mental status.  He has dementia at baseline unclear what his baseline mental status is but apparently has been more disoriented today.  Patient is oriented to self but does not know why he is in the hospital without he is in the hospital or the year.  Vital signs are reassuring.   His neurologic exam is nonfocal and he is denying any complaints currently. Will check basic labs, urine, CTH, EKG, CXR.  Spoke with patient's daughter Erik Larson who notes that patient was very much different than his baseline today.  She tells me she does not have dementia he was hallucinating today more agitated and disoriented.  I do state  patient has history of hallucinations in the past and he is legally blind, but from what daughter is telling me this was different today.  Patient's labs are overall reassuring CMP largely within normal limits CBC without leukocytosis.  Urinalysis which was taken from the suprapubic catheter shows 21-50 white cells greater than 50 red cells rare bacteria.  Difficult to know how to interpret this.  I did look at patient's prior culture from 3 months ago which was growing Pseudomonas Partida.  Spoke with pharmacy who notes that ceftaz is the preferred treatment for this.  CT head is negative chest x-ray is clear.  Do not have another cause of patient's altered mental status at this time.  Will send urine culture but for now we will treat with ceftazidime.  On reassessment patient is more agitated trying to getting bed he is tachycardic.  He is however redirectable.  Will have low threshold to give antipsychotic if continues to have agitation.  Given patient seems to be off his baseline will admit to the hospital.         FINAL CLINICAL IMPRESSION(S) / ED DIAGNOSES   Final diagnoses:  Altered mental status, unspecified altered mental status type     Rx / DC Orders   ED Discharge Orders     None        Note:  This document was prepared using Dragon voice recognition software and may include unintentional dictation errors.   Rada Hay, MD 06/11/22 2225

## 2022-06-11 NOTE — Assessment & Plan Note (Signed)
Cont iv ppi therapy.  Aspiration precaution.

## 2022-06-11 NOTE — Assessment & Plan Note (Signed)
AKI today shows A-fib that is rate controlled. See image below will continue Coumadin with pharmacy consult.

## 2022-06-11 NOTE — Assessment & Plan Note (Signed)
Will continue patient on his Flomax and Proscar for his BPH.  Patient has symptomatic BPH with urinary retention and recurrent urinary tract infection and currently has a suprapubic catheter. Patient continue follow-up with his urologist on a regular basis and has outpatient appointments.

## 2022-06-11 NOTE — Assessment & Plan Note (Addendum)
Vitals:   06/11/22 1940 06/11/22 2000 06/11/22 2100 06/11/22 2130  BP: (!) 141/71 (!) 131/57 (!) 142/84 (!) 144/87  Chart reviewed no antihypertensive medications currently. Will resume if any treatment and charged once med rec is available.

## 2022-06-11 NOTE — Assessment & Plan Note (Signed)
Attribute patient's confusion delirium to current infection. For we will obtain an MRI of the brain as well to assess for new behavioral change. Patient is at high risk for CVA.

## 2022-06-11 NOTE — H&P (Addendum)
History and Physical     Patient: Erik Larson ZOX:[id number] DOB: 1924-12-21 DOA: 06/11/2022 DOS: the patient was seen and examined on 06/12/2022 PCP: Marina Goodell, MD   Patient coming from: SNF  Chief Complaint: Altered mental status  HISTORY OF PRESENT ILLNESS: Erik Larson is an 89 y.o. male with PMH disorientation altered mental status patient found to be wanting creatinine Erik Larson was cooperative but disoriented.  Patient has a history of dementia, urinary retention status post suprapubic catheter placed by IR 07 June 2022.  Facility was concerned that patient confused and brought to the hospital.  No report of palpitations, chest pain shortness of breath or any other issues.  Patient has had hallucinations where he is seeing people that were not they are sitting in the chair.  Patient does report getting confused when he has a urinary tract infection.  Patient sees urology Dr. Lonna Cobb. Presentation is not clear as far as source but suspect urinary as pt has h/o rec uti from urinary retention.   Past Medical History:  Diagnosis Date   Arthritis    Atrial fibrillation (HCC)    controlled on coumadin   GERD (gastroesophageal reflux disease)    Glaucoma    Gout    Heart disease    Hyperlipidemia    Warfarin therapy started 07/29/2014   Review of Systems  Unable to perform ROS: Age   Allergies  Allergen Reactions   Ace Inhibitors    Penicillins    Past Surgical History:  Procedure Laterality Date   COLONOSCOPY  2005 ?   GLAUCOMA SURGERY     MEDICATIONS: Prior to Admission medications   Medication Sig Start Date End Date Taking? Authorizing Provider  allopurinol (ZYLOPRIM) 100 MG tablet Take 1 tablet (100 mg total) by mouth 2 (two) times daily. 07/19/17   Duanne Limerick, MD  dorzolamide-timolol (COSOPT) 22.3-6.8 MG/ML ophthalmic solution Apply 1 drop to eye as needed. Dr Beverely Pace    [provider]  finasteride (PROSCAR) 5 MG tablet Take 1 tablet (5 mg  total) by mouth daily. 07/07/17   Duanne Limerick, MD  latanoprost (XALATAN) 0.005 % ophthalmic solution Apply 1 drop to eye as needed. Dr Beverely Pace    [provider]  melatonin 3 MG TABS tablet Take 3 mg by mouth at bedtime as needed.    [provider]  mupirocin ointment (BACTROBAN) 2 % Apply 1 Application topically 2 (two) times daily. 05/30/22 06/29/22  Stoioff, Verna Czech, MD  risperiDONE (RISPERDAL) 0.5 MG tablet Take 0.5 mg by mouth at bedtime. 09/18/21   [provider]  tamsulosin (FLOMAX) 0.4 MG CAPS capsule Take 0.4 mg by mouth daily. 08/05/21   [provider]  warfarin (COUMADIN) 6 MG tablet TAKE 1 TABLET DAILY Patient taking differently: Take 3-6 mg by mouth as directed. Take 3 mg on Wednesday and Saturday Take 6 mg on Sunday, Monday,Tuesday, Thursday, and Friday 12/20/17   Duanne Limerick, MD    sodium chloride 50 mL/hr at 06/12/22 0006   ED Course: Pt in Ed pt is alert and cooperative.pt meets sepsis criteria. Vitals:   06/11/22 2200 06/11/22 2300 06/12/22 0000 06/12/22 0011  BP: (!) 162/82 124/86 (!) 149/72   Pulse: 66  94   Resp: 17 11 14    Temp:    98.2 F (36.8 C)  TempSrc:    Oral  SpO2: 98% 99% 99%    Total I/O In: 1000 [IV Piggyback:1000] Out: 540 [Urine:540] SpO2: 99 % Blood  work in ed shows: EKG today shows  A-fib at 91 with right bundle branch block QTc of 462. CMP shows normal LFTs normal creatinine sodium 134 and glucose of 172. Urinalysis is abnormal with large hemoglobin moderate 21-50 WBCs. CBC shows hemoglobin 11.6 white count. Results for orders placed or performed during the hospital encounter of 06/11/22 (from the past 72 hour(s))  Comprehensive metabolic panel     Status: Abnormal   Collection Time: 06/11/22  7:50 PM  Result Value Ref Range   Sodium 134 (L) 135 - 145 mmol/L   Potassium 4.0 3.5 - 5.1 mmol/L   Chloride 105 98 - 111 mmol/L   CO2 22 22 - 32 mmol/L   Glucose, Bld 172 (H) 70 - 99 mg/dL    Comment: Glucose  reference range applies only to samples taken after fasting for at least 8 hours.   BUN 21 8 - 23 mg/dL   Creatinine, Ser 1.61 0.61 - 1.24 mg/dL   Calcium 8.3 (L) 8.9 - 10.3 mg/dL   Total Protein 7.2 6.5 - 8.1 g/dL   Albumin 3.3 (L) 3.5 - 5.0 g/dL   AST 26 15 - 41 U/L   ALT 14 0 - 44 U/L   Alkaline Phosphatase 104 38 - 126 U/L   Total Bilirubin 0.5 0.3 - 1.2 mg/dL   GFR, Estimated >09 >60 mL/min    Comment: (NOTE) Calculated using the CKD-EPI Creatinine Equation (2021)    Anion gap 7 5 - 15    Comment: Performed at [facility], [address] [place], [place] [address]  CBC with Differential     Status: Abnormal   Collection Time: 06/11/22  7:50 PM  Result Value Ref Range   WBC 10.5 4.0 - 10.5 K/uL   RBC 3.62 (L) 4.22 - 5.81 MIL/uL   Hemoglobin 11.6 (L) 13.0 - 17.0 g/dL   HCT 81.1 (L) 91.4 - 78.2 %   MCV 93.6 80.0 - 100.0 fL   MCH 32.0 26.0 - 34.0 pg   MCHC 34.2 30.0 - 36.0 g/dL   RDW 95.6 21.3 - 08.6 %   Platelets 270 150 - 400 K/uL   nRBC 0.0 0.0 - 0.2 %   Neutrophils Relative % 78 %   Neutro Abs 8.2 (H) 1.7 - 7.7 K/uL   Lymphocytes Relative 11 %   Lymphs Abs 1.2 0.7 - 4.0 K/uL   Monocytes Relative 7 %   Monocytes Absolute 0.7 0.1 - 1.0 K/uL   Eosinophils Relative 2 %   Eosinophils Absolute 0.2 0.0 - 0.5 K/uL   Basophils Relative 1 %   Basophils Absolute 0.1 0.0 - 0.1 K/uL   Immature Granulocytes 1 %   Abs Immature Granulocytes 0.06 0.00 - 0.07 K/uL    Comment: Performed at [facility], [address] [place], [place] [address]  Urinalysis, Routine w reflex microscopic -Urine, Suprapubic     Status: Abnormal   Collection Time: 06/11/22  9:05 PM  Result Value Ref Range   Color, Urine YELLOW (A) YELLOW   APPearance HAZY (A) CLEAR   Specific Gravity, Urine 1.013 1.005 - 1.030   pH 6.0 5.0 - 8.0   Glucose, UA NEGATIVE NEGATIVE mg/dL   Hgb urine dipstick LARGE (A) NEGATIVE   Bilirubin Urine NEGATIVE NEGATIVE   Ketones, ur NEGATIVE NEGATIVE  mg/dL   Protein, ur 962 (A) NEGATIVE mg/dL   Nitrite NEGATIVE NEGATIVE   Leukocytes,Ua MODERATE (A) NEGATIVE   RBC / HPF >50 0 - 5 RBC/hpf  WBC, UA 21-50 0 - 5 WBC/hpf   Bacteria, UA RARE (A) NONE SEEN   Squamous Epithelial / HPF 0-5 0 - 5 /HPF   Mucus PRESENT     Comment: Performed at [facility], [address] [place], [place] [address]  Protime-INR     Status: Abnormal   Collection Time: 06/11/22 10:54 PM  Result Value Ref Range   Prothrombin Time 17.2 (H) 11.4 - 15.2 seconds   INR 1.4 (H) 0.8 - 1.2    Comment: (NOTE) INR goal varies based on device and disease states. Performed at [facility], [address] [place], [place] [address]   Lactic acid, plasma     Status: None   Collection Time: 06/11/22 10:54 PM  Result Value Ref Range   Lactic Acid, Venous 1.9 0.5 - 1.9 mmol/L    Comment: Performed at [facility], [address] [place], [place] [address]    Lab Results  Component Value Date   CREATININE 1.03 06/11/2022   CREATININE 1.28 (H) 03/05/2022   CREATININE 0.92 12/18/2021      Latest Ref Rng & Units 06/11/2022    7:50 PM 03/05/2022    4:02 AM 12/18/2021    1:03 PM  CMP  Glucose 70 - 99 mg/dL 914  782  956   BUN 8 - 23 mg/dL 21  19  16    Creatinine 0.61 - 1.24 mg/dL 2.13  0.86  5.78   Sodium 135 - 145 mmol/L 134  138  141   Potassium 3.5 - 5.1 mmol/L 4.0  4.5  4.2   Chloride 98 - 111 mmol/L 105  106  110   CO2 22 - 32 mmol/L 22  23  27    Calcium 8.9 - 10.3 mg/dL 8.3  8.9  8.6   Total Protein 6.5 - 8.1 g/dL 7.2     Total Bilirubin 0.3 - 1.2 mg/dL 0.5     Alkaline Phos 38 - 126 U/L 104     AST 15 - 41 U/L 26     ALT 0 - 44 U/L 14      Unresulted Labs (From admission, onward)     Start     Ordered   06/12/22 0500  Comprehensive metabolic panel  Tomorrow morning,   STAT        06/11/22 2339   06/12/22 0500  CBC  Tomorrow morning,   STAT        06/11/22 2339   06/12/22 0500  Protime-INR  Daily,   R      06/12/22 0010    06/11/22 2221  Urine Culture  Add-on,   AD       Question:  Indication  Answer:  Altered mental status (if no other cause identified)   06/11/22 2220   06/11/22 2221  Blood culture (routine x 2)  BLOOD CULTURE X 2,   STAT      06/11/22 2220   06/11/22 2220  Lactic acid, plasma  Now then every 2 hours,   STAT      06/11/22 2220           Pt has received : Orders Placed This Encounter  Procedures   1-3 Lead EKG Interpretation    This order was created via procedure documentation    Standing Status:   Standing    Number of Occurrences:   1   Urine Culture    Standing Status:   Standing    Number of Occurrences:  1    Order Specific Question:   Indication    Answer:   Altered mental status (if no other cause identified)   Blood culture (routine x 2)    Standing Status:   Standing    Number of Occurrences:   2   CT HEAD WO CONTRAST ( )    Standing Status:   Standing    Number of Occurrences:   1   DG Chest Portable 1 View    Standing Status:   Standing    Number of Occurrences:   1    Order Specific Question:   Reason for Exam (SYMPTOM  OR DIAGNOSIS REQUIRED)    Answer:   r/o pna   Comprehensive metabolic panel    Standing Status:   Standing    Number of Occurrences:   1   CBC with Differential    Standing Status:   Standing    Number of Occurrences:   1   Urinalysis, Routine w reflex microscopic -Urine, Suprapubic    Standing Status:   Standing    Number of Occurrences:   1    Order Specific Question:   Specimen Source    Answer:   Urine, Suprapubic [385]   Protime-INR    Standing Status:   Standing    Number of Occurrences:   1   Lactic acid, plasma    Standing Status:   Standing    Number of Occurrences:   2   Comprehensive metabolic panel    Standing Status:   Standing    Number of Occurrences:   1   CBC    Standing Status:   Standing    Number of Occurrences:   1   Protime-INR    Standing Status:   Standing    Number of Occurrences:   9   DIET - DYS  1 Room service appropriate? Yes with Assist; Fluid consistency: Nectar Thick    Standing Status:   Standing    Number of Occurrences:   1    Order Specific Question:   Room service appropriate?    Answer:   Yes with Assist    Order Specific Question:   Fluid consistency:    Answer:   Nectar Thick   Check Rectal Temperature    Standing Status:   Standing    Number of Occurrences:   1   Maintain IV access    Standing Status:   Standing    Number of Occurrences:   1   Vital signs    Standing Status:   Standing    Number of Occurrences:   1   Notify physician (specify)    Standing Status:   Standing    Number of Occurrences:   20    Order Specific Question:   Notify Physician    Answer:   for pulse less than 55 or greater than 120    Order Specific Question:   Notify Physician    Answer:   for respiratory rate less than 12 or greater than 25    Order Specific Question:   Notify Physician    Answer:   for temperature greater than 100.5 F    Order Specific Question:   Notify Physician    Answer:   for urinary output less than 30 mL/hr for four hours    Order Specific Question:   Notify Physician    Answer:   for systolic BP less than 90 or greater than 160, diastolic BP less  than 60 or greater than 100    Order Specific Question:   Notify Physician    Answer:   for new hypoxia w/ oxygen saturations < 88%   Progressive Mobility Protocol: No Restrictions    Standing Status:   Standing    Number of Occurrences:   1   Daily weights    Standing Status:   Standing    Number of Occurrences:   1   Intake and Output    Standing Status:   Standing    Number of Occurrences:   1   Initiate Oral Care Protocol    Standing Status:   Standing    Number of Occurrences:   1   Initiate Carrier Fluid Protocol    Standing Status:   Standing    Number of Occurrences:   1   RN may order General Admission PRN Orders utilizing "General Admission PRN medications" (through manage orders) for the  following patient needs: allergy symptoms (Claritin), cold sores (Carmex), cough (Robitussin DM), eye irritation (Liquifilm Tears), hemorrhoids (Tucks), indigestion (Maalox), minor skin irritation (Hydrocortisone Cream), muscle pain Romeo Apple Gay), nose irritation (saline nasal spray) and sore throat (Chloraseptic spray).    Standing Status:   Standing    Number of Occurrences:   [id number]   Cardiac Monitoring Continuous x 48 hours Indications for use: Other; Other indications for use: suspected sepsis.    Standing Status:   Standing    Number of Occurrences:   1    Order Specific Question:   Indications for use:    Answer:   Other    Order Specific Question:   Other indications for use:    Answer:   suspected sepsis.   Bladder scan    To check for urine retention, call if > .    Standing Status:   Standing    Number of Occurrences:   1   Check for fecal impaction; check bowel movement record; assess for pain.    Standing Status:   Standing    Number of Occurrences:   1   Continue foley catheter    Standing Status:   Standing    Number of Occurrences:   1    Order Specific Question:   Reason to continue foley    Answer:   acute urinary retention   Continue foley catheter    Standing Status:   Standing    Number of Occurrences:   1    Order Specific Question:   Reason to continue foley    Answer:   strict I & O   Continue foley catheter    Standing Status:   Standing    Number of Occurrences:   1    Order Specific Question:   Reason to continue foley    Answer:   strict I & O   Continue foley catheter    Standing Status:   Standing    Number of Occurrences:   1    Order Specific Question:   Reason to continue foley    Answer:   strict I & O   Care order/instruction: 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    Standing Status:   Standing    Number of Occurrences:   1   Full code    Standing Status:   Standing    Number of Occurrences:   1    Order Specific Question:    By:    Answer:  Other   Consult to hospitalist    Standing Status:   Standing    Number of Occurrences:   1    Order Specific Question:   Place call to:    Answer:   [phone]    Order Specific Question:   Reason for Consult    Answer:   Admit    Order Specific Question:   Diagnosis/Clinical Info for Consult:    Answer:   AMS   warfarin (COUMADIN) per pharmacy consult    Standing Status:   Standing    Number of Occurrences:   1    Order Specific Question:   Indication:    Answer:   Atrial Fibrillation   Pulse oximetry check with vital signs    Standing Status:   Standing    Number of Occurrences:   1   Oxygen therapy Mode or (Route): Nasal cannula; Liters Per Minute: 2; Keep 02 saturation: greater than 92 %    Standing Status:   Standing    Number of Occurrences:   20    Order Specific Question:   Mode or (Route)    Answer:   Nasal cannula    Order Specific Question:   Liters Per Minute    Answer:   2    Order Specific Question:   Keep 02 saturation    Answer:   greater than 92 %   EKG 12-Lead    Standing Status:   Standing    Number of Occurrences:   1   ED EKG    Standing Status:   Standing    Number of Occurrences:   1    Order Specific Question:   Reason for Exam    Answer:   Chest Pain   EKG 12-Lead    For chest pain    Standing Status:   Standing    Number of Occurrences:   20   Admit to Inpatient (patient's expected length of stay will be greater than 2 midnights or inpatient only procedure)    Standing Status:   Standing    Number of Occurrences:   1    Order Specific Question:   Hospital Area    Answer:   [facility] [[id number]]    Order Specific Question:   Level of Care    Answer:   Telemetry Medical [104]    Order Specific Question:   Covid Evaluation    Answer:   Asymptomatic - no recent exposure (last 10 days) testing not required    Order Specific Question:   Diagnosis    Answer:   AMS (altered mental status) [6010932]    Order  Specific Question:   Admitting Physician    Answer:   Darrold Junker    Order Specific Question:   Attending Physician    Answer:   Darrold Junker    Order Specific Question:   Certification:    Answer:   I certify this patient will need inpatient services for at least 2 midnights    Order Specific Question:   Estimated Length of Stay    Answer:   2   Aspiration precautions    Standing Status:   Standing    Number of Occurrences:   1    Meds ordered this encounter  Medications   DISCONTD: cefTAZidime (FORTAZ) injection 1 g    Order Specific Question:   Antibiotic Indication:    Answer:   UTI   lactated ringers bolus  1,000 mL   cefTAZidime (FORTAZ) 1 g in sodium chloride 0.9 % 100 mL IVPB    Order Specific Question:   Antibiotic Indication:    Answer:   UTI   allopurinol (ZYLOPRIM) tablet 100 mg   finasteride (PROSCAR) tablet 5 mg   tamsulosin (FLOMAX) capsule 0.4 mg   risperiDONE (RISPERDAL) tablet 0.5 mg   melatonin tablet 5 mg   sodium chloride flush (NS) 0.9 % injection 3 mL   0.9 %  sodium chloride infusion   OR Linked Order Group    acetaminophen (TYLENOL) tablet 650 mg    acetaminophen (TYLENOL) suppository 650 mg   warfarin (COUMADIN) tablet 4 mg   Warfarin - Pharmacist Dosing Inpatient   FOLLOWED BY Linked Order Group    warfarin (COUMADIN) tablet 7.5 mg    warfarin (COUMADIN) tablet 6 mg    Admission Imaging : CT HEAD WO CONTRAST ( )  Result Date: 06/11/2022 CLINICAL DATA:  Altered level of consciousness, disoriented, recent urinary tract infection EXAM: CT HEAD WITHOUT CONTRAST TECHNIQUE: Contiguous axial images were obtained from the base of the skull through the vertex without intravenous contrast. RADIATION DOSE REDUCTION: This exam was performed according to the departmental dose-optimization program which includes automated exposure control, adjustment of the mA and/or kV according to patient size and/or use of iterative reconstruction  technique. COMPARISON:  09/25/2021 FINDINGS: Brain: Stable chronic small-vessel ischemic changes throughout the periventricular white matter. No acute infarct or hemorrhage. Lateral ventricles and midline structures are stable. Cavum septum pellucidum and vergae again noted. No acute extra-axial fluid collection. No mass effect. Vascular: Diffuse atherosclerosis of the internal carotid arteries unchanged. No hyperdense vessel. Skull: Normal. Negative for fracture or focal lesion. Sinuses/Orbits: No acute finding. Other: None. IMPRESSION: 1. Stable head CT, no acute intracranial process. Electronically Signed   By: Sharlet Salina M.D.   On: 06/11/2022 20:31   DG Chest Portable 1 View  Result Date: 06/11/2022 CLINICAL DATA:  Rule out pneumonia EXAM: PORTABLE CHEST 1 VIEW COMPARISON:  09/25/2021 FINDINGS: Mild cardiomegaly. Aortic atherosclerosis. Low lung volumes. Interstitial prominence within the lungs, similar to prior study. No confluent airspace opacities or effusions. No acute bony abnormality. IMPRESSION: Stable interstitial prominence which may reflect chronic lung disease. No confluent opacity to suggest pneumonia. Electronically Signed   By: Charlett Nose M.D.   On: 06/11/2022 20:23   Physical Examination: Vitals:   06/11/22 2200 06/11/22 2300 06/12/22 0000 06/12/22 0011  BP: (!) 162/82 124/86 (!) 149/72   Pulse: 66  94   Temp:    98.2 F (36.8 C)  Resp: 17 11 14    SpO2: 98% 99% 99%   TempSrc:    Oral   Physical Exam Vitals and nursing note reviewed.  Constitutional:      General: He is not in acute distress.    Appearance: He is not ill-appearing, toxic-appearing or diaphoretic.  HENT:     Head: Normocephalic and atraumatic.     Right Ear: Hearing and external ear normal.     Left Ear: Hearing and external ear normal.     Nose: Nose normal. No nasal deformity.     Mouth/Throat:     Lips: Pink.     Mouth: Mucous membranes are moist.     Tongue: No lesions.     Pharynx: Oropharynx  is clear.  Eyes:     General: Lids are normal.     Extraocular Movements: Extraocular movements intact.     Comments: Blind.   Neck:  Vascular: No carotid bruit.  Cardiovascular:     Rate and Rhythm: Normal rate and regular rhythm.     Pulses: Normal pulses.     Heart sounds: Normal heart sounds.  Pulmonary:     Effort: Pulmonary effort is normal.     Breath sounds: Normal breath sounds.  Abdominal:     General: Bowel sounds are normal. There is no distension.     Palpations: Abdomen is soft. There is no mass.     Tenderness: There is no abdominal tenderness. There is no guarding.     Hernia: No hernia is present.     Comments: Suprapubic catheter 06/07/2022.  Musculoskeletal:     Right lower leg: No edema.     Left lower leg: No edema.  Skin:    General: Skin is warm.  Neurological:     General: No focal deficit present.     Mental Status: He is alert and oriented to person, place, and time.     Cranial Nerves: Cranial nerves 2-12 are intact.     Motor: Motor function is intact.  Psychiatric:        Attention and Perception: Attention normal.        Mood and Affect: Mood normal.        Speech: Speech normal.        Behavior: Behavior normal. Behavior is cooperative.        Cognition and Memory: Cognition normal.     Assessment and Plan: * Altered mental status Attribute patient's confusion delirium to current infection. For we will obtain an MRI of the brain as well to assess for new behavioral change. Patient is at high risk for CVA.  Anemia    Latest Ref Rng & Units 06/11/2022    7:50 PM 03/05/2022    4:02 AM 12/18/2021    1:03 PM  CBC  WBC 4.0 - 10.5 K/uL 10.5  10.2  7.5   Hemoglobin 13.0 - 17.0 g/dL 16.1  09.6  04.5   Hematocrit 39.0 - 52.0 % 33.9  33.5  35.6   Platelets 150 - 400 K/uL 270  290  274   H&H stable with hemoglobin 11.6. Continue IV PPI.   Essential hypertension Vitals:   06/11/22 1940 06/11/22 2000 06/11/22 2100 06/11/22 2130  BP: (!)  141/71 (!) 131/57 (!) 142/84 (!) 144/87  Chart reviewed no antihypertensive medications currently. Will resume if any treatment and charged once med rec is available.    Benign prostatic hyperplasia with lower urinary tract symptoms Will continue patient on his Flomax and Proscar for his BPH.  Patient has symptomatic BPH with urinary retention and recurrent urinary tract infection and currently has a suprapubic catheter. Patient continue follow-up with his urologist on a regular basis and has outpatient appointments.  Acid reflux Cont iv ppi therapy.  Aspiration precaution.   Atrial fibrillation, controlled (HCC) AKI today shows A-fib that is rate controlled. See image below will continue Coumadin with pharmacy consult.   Long term current use of anticoagulant therapy Patient currently on Coumadin therapy for chronic A-fib. Pharmacy consult for dosing and PT/INR follow-up and management.    DVT prophylaxis:  Coumadin.   Code Status:  Ful Code.       06/11/2022    7:42 PM  Advanced Directives  Does Patient Have a Medical Advance Directive? Unable to assess, patient is non-responsive or altered mental status    Family Communication:  None.   Emergency Contact: Contact Information  Name Relation Home Work Mobile   Hendricks,Kay Daughter   [phone]   Jonathon Bellows   [phone]   Erenest Rasher Daughter   [phone]       Disposition Plan:  [facility].   Consults: None.   Admission status: Inpatient.    Unit / Expected LOS: Med tele. / 2-3 days.    Gertha Calkin MD [facility]  6 PM- 2 AM. [phone]( Pager ) Please use [email or web address] to contact the current [facility] MD  based on hours  and team and services or  You may call [phone] to contact current Assigned [facility] Attending/Consulting MD for this patient.  This number is the [facility] ADMIT/Consult system  and will be able to assist any patient in any [facility] location  .

## 2022-06-11 NOTE — Assessment & Plan Note (Signed)
Patient currently on Coumadin therapy for chronic A-fib. Pharmacy consult for dosing and PT/INR follow-up and management.

## 2022-06-12 ENCOUNTER — Other Ambulatory Visit: Payer: Self-pay

## 2022-06-12 ENCOUNTER — Encounter: Payer: Self-pay | Admitting: Internal Medicine

## 2022-06-12 DIAGNOSIS — N390 Urinary tract infection, site not specified: Secondary | ICD-10-CM | POA: Diagnosis not present

## 2022-06-12 DIAGNOSIS — T83510A Infection and inflammatory reaction due to cystostomy catheter, initial encounter: Secondary | ICD-10-CM | POA: Diagnosis not present

## 2022-06-12 DIAGNOSIS — F0390 Unspecified dementia without behavioral disturbance: Secondary | ICD-10-CM

## 2022-06-12 DIAGNOSIS — R410 Disorientation, unspecified: Secondary | ICD-10-CM | POA: Diagnosis not present

## 2022-06-12 LAB — COMPREHENSIVE METABOLIC PANEL
ALT: 12 U/L (ref 0–44)
AST: 23 U/L (ref 15–41)
Albumin: 3.1 g/dL — ABNORMAL LOW (ref 3.5–5.0)
Alkaline Phosphatase: 91 U/L (ref 38–126)
Anion gap: 6 (ref 5–15)
BUN: 15 mg/dL (ref 8–23)
CO2: 25 mmol/L (ref 22–32)
Calcium: 8.3 mg/dL — ABNORMAL LOW (ref 8.9–10.3)
Chloride: 108 mmol/L (ref 98–111)
Creatinine, Ser: 1 mg/dL (ref 0.61–1.24)
GFR, Estimated: >60 mL/min (ref >60)
Glucose, Bld: 103 mg/dL — ABNORMAL HIGH (ref 70–99)
Potassium: 3.8 mmol/L (ref 3.5–5.1)
Sodium: 139 mmol/L (ref 135–145)
Total Bilirubin: 0.5 mg/dL (ref 0.3–1.2)
Total Protein: 6.7 g/dL (ref 6.5–8.1)

## 2022-06-12 LAB — CBC
HCT: 31.7 % — ABNORMAL LOW (ref 39.0–52.0)
Hemoglobin: 10.8 g/dL — ABNORMAL LOW (ref 13.0–17.0)
MCH: 31.7 pg (ref 26.0–34.0)
MCHC: 34.1 g/dL (ref 30.0–36.0)
MCV: 93 fL (ref 80.0–100.0)
Platelets: 259 10*3/uL (ref 150–400)
RBC: 3.41 MIL/uL — ABNORMAL LOW (ref 4.22–5.81)
RDW: 12.9 % (ref 11.5–15.5)
WBC: 7.4 10*3/uL (ref 4.0–10.5)
nRBC: 0 % (ref 0.0–0.2)

## 2022-06-12 LAB — PROTIME-INR
INR: 1.6 — ABNORMAL HIGH (ref 0.8–1.2)
Prothrombin Time: 18.9 seconds — ABNORMAL HIGH (ref 11.4–15.2)

## 2022-06-12 LAB — LACTIC ACID, PLASMA: Lactic Acid, Venous: 1.9 mmol/L (ref 0.5–1.9)

## 2022-06-12 MED ORDER — SODIUM CHLORIDE 0.9 % IV SOLN
1.0000 g | Freq: Once | INTRAVENOUS | Status: AC
  Administered 2022-06-12: 1 g via INTRAVENOUS
  Filled 2022-06-12: qty 1

## 2022-06-12 MED ORDER — SODIUM CHLORIDE 0.9 % IV SOLN: 1.0000 g | INTRAVENOUS | Status: DC

## 2022-06-12 MED ORDER — DEXTROSE 5 % IV SOLN
0.5000 g | Freq: Two times a day (BID) | INTRAVENOUS | Status: DC
  Administered 2022-06-12 – 2022-06-13 (×3): 0.5 g via INTRAVENOUS
  Filled 2022-06-12 (×5): qty 0.5

## 2022-06-12 MED ORDER — WARFARIN - PHARMACIST DOSING INPATIENT
Freq: Every day | Status: DC
  Filled 2022-06-12: qty 1

## 2022-06-12 MED ORDER — WARFARIN SODIUM 7.5 MG PO TABS
7.5000 mg | ORAL_TABLET | Freq: Every day | ORAL | Status: AC
  Administered 2022-06-12 – 2022-06-13 (×2): 7.5 mg via ORAL
  Filled 2022-06-12 (×2): qty 1

## 2022-06-12 MED ORDER — WARFARIN SODIUM 4 MG PO TABS
4.0000 mg | ORAL_TABLET | ORAL | Status: AC
  Administered 2022-06-12: 4 mg via ORAL
  Filled 2022-06-12 (×2): qty 1

## 2022-06-12 MED ORDER — WARFARIN SODIUM 6 MG PO TABS: 6.0000 mg | ORAL_TABLET | Freq: Every day | ORAL | Status: DC

## 2022-06-12 NOTE — Progress Notes (Signed)
ANTICOAGULATION CONSULT NOTE -   Pharmacy Consult for Warfarin  Indication: atrial fibrillation  Allergies  Allergen Reactions   Ace Inhibitors    Penicillins     Patient Measurements: Weight: 81.3 kg (179 lb 3.7 oz) Heparin Dosing Weight:   Vital Signs: Temp: 97.5 F (36.4 C) (03/31 0908) Temp Source: Oral (03/31 0431) BP: 120/67 (03/31 0908) Pulse Rate: 72 (03/31 0908)  Labs: Recent Labs    06/11/22 1950 06/11/22 2254 06/12/22 0616  HGB 11.6*  --  10.8*  HCT 33.9*  --  31.7*  PLT 270  --  259  LABPROT  --  17.2* 18.9*  INR  --  1.4* 1.6*  CREATININE 1.03  --  1.00     Estimated Creatinine Clearance: 45.1 mL/min (by C-G formula based on SCr of 1 mg/dL).   Medical History: Past Medical History:  Diagnosis Date   Arthritis    Atrial fibrillation ([place])    controlled on coumadin   GERD (gastroesophageal reflux disease)    Glaucoma    Gout    Heart disease    Hyperlipidemia    Warfarin therapy started 07/29/2014    Medications:  Medications Prior to Admission  Medication Sig Dispense Refill Last Dose   acetaminophen (TYLENOL) 500 MG tablet Take 500 mg by mouth every 6 (six) hours as needed for mild pain or moderate pain.   06/10/2022 at 0731   allopurinol (ZYLOPRIM) 100 MG tablet Take 100 mg by mouth daily.   06/11/2022 at 0822   BACTRIM 400-80 MG tablet Take 1 tablet by mouth daily.   06/11/2022 at 0824   Cholecalciferol 50 MCG (2000 UT) TABS Take 2,000 Units by mouth daily.   06/11/2022 at 0822   Cranberry 450 MG TABS Take 450 mg by mouth daily.   06/11/2022 at 0822   dorzolamide-timolol (COSOPT) 2-0.5 % ophthalmic solution Place 1 drop into both eyes 2 (two) times daily.   06/11/2022 at 0821   finasteride (PROSCAR) 5 MG tablet Take 1 tablet (5 mg total) by mouth daily. 90 tablet 1 06/11/2022 at 0822   latanoprost (XALATAN) 0.005 % ophthalmic solution Place 1 drop into both eyes at bedtime. Dr Atilano Median   06/10/2022 at 2127   melatonin 3 MG TABS tablet Take 3 mg by  mouth at bedtime as needed.   prn at prn   mupirocin ointment (BACTROBAN) 2 % Apply 1 Application topically 2 (two) times daily. 60 g 0 06/11/2022 at 0822   phenazopyridine (PYRIDIUM) 95 MG tablet Take 95 mg by mouth 3 (three) times daily as needed (urinary spasms).   prn at prn   psyllium (REGULOID) 0.52 g capsule Take 0.52 g by mouth at bedtime.   06/10/2022 at 2127   risperiDONE (RISPERDAL) 0.5 MG tablet Take 0.5 mg by mouth at bedtime.   06/10/2022 at 2127   sennosides-docusate sodium (SENOKOT-S) 8.6-50 MG tablet Take one tablet by mouth at bedtime on Mondays, Wednesdays, and Fridays.   06/10/2022 at 2127   silodosin (RAPAFLO) 8 MG CAPS capsule Take 8 mg by mouth 2 (two) times daily.   06/11/2022 at 0822   tamsulosin (FLOMAX) 0.4 MG CAPS capsule Take 0.4 mg by mouth daily.   06/11/2022 at 0822   traMADol (ULTRAM) 50 MG tablet Take 25 mg by mouth 2 (two) times daily. For 7 days.      warfarin (COUMADIN) 7.5 MG tablet Take 7.5 mg by mouth daily. For 5 days.   06/11/2022 at 1709   allopurinol (ZYLOPRIM) 100  MG tablet Take 1 tablet (100 mg total) by mouth 2 (two) times daily. (Patient not taking: Reported on 06/11/2022) 180 tablet 1 Not Taking    Assessment: Pharmacy consulted to manage warfarin for Afib in this 89 year old male admitted with AMS, UTI.  Pt home warfarin schedule is unclear but appears to have been on warfarin since at least 08/2021.   Doctor First note says pt was ordered warfarin 7.5 mg PO daily X 5 days on 3/28 and then to resume 6 mg PO daily.     Last dose :  Warfarin 7.5 mg PO on 3/30.   3/30  INR = 1.4     3/31  INR(@0616 )= 1.6   -Warfarin 4 mg given 3/31@0350      -Warfarin 7.5 mg scheduled for 1600 4/1  Goal of Therapy:  INR 2-3   Plan:  3/31:  INR@0616 = 1.6 subtherapeutic.  Patient received Warfarin 4 mg this am 3/31 @ 0350. -currently ordered Warfarin 7.5 mg PO daily X 2 days to  followed by warfarin 6 mg PO daily.  Will check INR daily CBC per  protocol  Vanden Fawaz A 06/12/2022,9:14 AM

## 2022-06-12 NOTE — Progress Notes (Signed)
ANTICOAGULATION CONSULT NOTE - Initial Consult  Pharmacy Consult for Warfarin  Indication: atrial fibrillation  Allergies  Allergen Reactions   Ace Inhibitors    Penicillins     Patient Measurements:   Heparin Dosing Weight:   Vital Signs: Temp: 98.2 F (36.8 C) (03/31 0011) Temp Source: Oral (03/31 0011) BP: 149/72 (03/31 0000) Pulse Rate: 94 (03/31 0000)  Labs: Recent Labs    06/11/22 1950 06/11/22 2254  HGB 11.6*  --   HCT 33.9*  --   PLT 270  --   LABPROT  --  17.2*  INR  --  1.4*  CREATININE 1.03  --     CrCl cannot be calculated (Unknown ideal weight.).   Medical History: Past Medical History:  Diagnosis Date   Arthritis    Atrial fibrillation ([place])    controlled on coumadin   GERD (gastroesophageal reflux disease)    Glaucoma    Gout    Heart disease    Hyperlipidemia    Warfarin therapy started 07/29/2014    Medications:  (Not in a hospital admission)   Assessment: Pharmacy consulted to manage warfarin for Afib in this 89 year old male admitted with AMS, UTI.  Pt home warfarin schedule is unclear but appears to have been on warfarin since at least 08/2021.  Doctor First note says pt was ordered warfarin 7.5 mg PO daily X 5 days on 3/28 and then to resume 6 mg PO daily.     Last dose :  Warfarin 7.5 mg PO on 3/30.   3/30:  INR = 1.4, SUBtherapeutic   Goal of Therapy:  INR 2-3   Plan:  3/30:  INR @ 2254 = 1.4, SUBtherapeutic  Will order warfarin 4 mg PO X 1 STAT on 3/31 @ ~ 0000 and then resume home warfarin schedule:  Warfarin 7.5 mg PO daily X 2 days to resume on 3/31 followed by warfarin 6 mg PO daily.   Will check INR daily  Ciclaly Mulcahey D 06/12/2022,12:48 AM

## 2022-06-12 NOTE — ED Notes (Signed)
Request made for transport to the floor ?

## 2022-06-12 NOTE — ED Notes (Signed)
I assumed care of pt @ 2300, 3/30. Offgoing RN reported emptying his leg bag twice. Bed alarm was turned on at that time and pt was boosted in bed and repositioned for comfort.

## 2022-06-12 NOTE — Progress Notes (Addendum)
  Progress Note   Patient: Erik Larson [id number] DOB: 1924/11/30 DOA: 06/11/2022     1 DOS: the patient was seen and examined on 06/12/2022   Brief hospital course:  89 year old gentleman with history of dementia who presents from facility on account of acute altered mental status from baseline.  Urinalysis concerning for UTI and hence was given ceftazidime x 1 in the ED due to prior Pseudomonas putida infection.  Urine culture is pending at this time.  He has significant risk factors for UTI including suprapubic catheter and BPH.  Assessment and Plan: Acute encephalopathy: Likely infectious in etiology.  Attributed to possible UTI superimposed on chronic dementia.  MRI has been ordered by admitting provider to rule out any acute intracranial abnormality.  As needed risperidone for agitation.  Catheter associated urinary tract infection: Patient has history of BPH and is status post suprapubic catheter.  Urinalysis concerning for UTI.  Patient was initiated on empiric antibiotics in the ED.  Will continue with same pending cultures.  Will tailor antibiotics accordingly based on urine cultures.  Previous admissions in 2023 showed Pseudomonas Putida.  BPH: Known history of obstructive symptoms.  Status post suprapubic catheter.  Follows up with urologist.  Patient is on finasteride.  Atrial fibrillation: Rate and rhythm controlled.not on any AV nodal agents.  He is on anticoagulation with Coumadin.    Subtherapeutic INR : Continue with Coumadin dosing.  Pharmacy to dose accordingly.  INR daily.  GERD/hyperlipidemia: Continue with home medication.  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  Chronic debility: Physical therapy and Occupational Therapy to work with patient due to course of stay.    Subjective: Patient doing well.  Patient is a poor historian due to his condition.  Unable to provide any significant history.  No events reported overnight by nursing team.  Physical  Exam: Vitals:   06/12/22 0126 06/12/22 0428 06/12/22 0431 06/12/22 0908  BP: (!) 150/73  (!) 154/85 120/67  Pulse: 89  87 72  Resp: 18  16 16   Temp: (!) 97.4 F (36.3 C)  97.6 F (36.4 C) (!) 97.5 F (36.4 C)  TempSrc: Oral  Oral   SpO2: 100%  100% 97%  Weight:  81.3 kg     Patient is an elderly and frail gentleman who was laying in bed.  Looks comfortable.  Confused at baseline.  No evidence of agitation at this time.  Physical examination was limited due to patient's condition Head/ENT: Difficult to assess due to patient's confusion Neck: Supple Chest: Diminished breath sounds bilaterally.  No added sounds.  No use of accessory muscles. Cardiovascular: S1-S2 with no murmur. Abdomen: suprapubic catheter CNS: Patient is confused at baseline.  Limited exam.  Cranial nerve exam could not be fully examined.  No obvious focal deficits were noted. Skin: Mild excoriation on left ankle.  Looks dry  Data Reviewed: Labs show sodium 139, potassium 3.8, chloride 108, bicarb 25, glucose 103, BUN 15, creatinine 1, WBC 7.4, hemoglobin 10.8, hematocrit 31.7, platelet count 259.  INR 1.6 with PT 18.9.    Family Communication: No family at bedside at this time.  Disposition: Status is: Inpatient Remains inpatient appropriate because: Active infection  Planned Discharge Destination: Skilled nursing facility DVT prophylaxis with Coumadin.    Time spent: 35 minutes  Author: Artist Beach, MD 06/12/2022 9:14 AM  For on call review [email or web address].

## 2022-06-12 NOTE — Evaluation (Signed)
Physical Therapy Evaluation Patient Details Name: Erik Larson MRN: [id number] DOB: Jul 24, 1924 Today's Date: 06/12/2022  History of Present Illness  Erik Larson is an 89 y.o. male with PMH disorientation altered mental status patient found to be wanting creatinine Magda Paganini was cooperative but disoriented.  Patient has a history of dementia, urinary retention status post suprapubic catheter placed by IR 07 June 2022.  Facility was concerned that patient confused and brought to the hospital.  No report of palpitations, chest pain shortness of breath or any other issues.  Patient has had hallucinations where he is seeing people that were not they are sitting in the chair.  Patient does report getting confused when he has a urinary tract infection.  Patient sees urology Dr. Bernardo Heater. Presentation is not clear as far as source but suspect urinary as pt has h/o rec uti from urinary retention.  Clinical Impression  Pt received in chair agreeable to PT evaluation. PT is a resident of ALF. PLOF is unknown at this point 2/2 pt is a poor historian. PT assessment revealed generalized weakness, endurance and dynamic balance increasing fall risk. Pt tranfers with CGA of 1 and ambulate din room with CGA to min assist with cues for sequencing and direction. Pt has impaired coordination. PT will continue in acute and will benefit optimally from moderate intensity post-acute PT services (<3 hours/day) and consistent staff assist for ADLs/mobility and return to ALF.    Recommendations for follow up therapy are one component of a multi-disciplinary discharge planning process, led by the attending physician.  Recommendations may be updated based on patient status, additional functional criteria and insurance authorization.  Follow Up Recommendations Can patient physically be transported by private vehicle: No     Assistance Recommended at Discharge Intermittent Supervision/Assistance  Patient can return home with the  following  A little help with walking and/or transfers;A little help with bathing/dressing/bathroom;Assistance with cooking/housework;Assistance with feeding;Direct supervision/assist for medications management;Direct supervision/assist for financial management;Assist for transportation    Equipment Recommendations None recommended by PT  Recommendations for Other Services       Functional Status Assessment Patient has had a recent decline in their functional status and demonstrates the ability to make significant improvements in function in a reasonable and predictable amount of time.     Precautions / Restrictions Precautions Precautions: Fall Restrictions Weight Bearing Restrictions: No      Mobility  Bed Mobility               General bed mobility comments: Pt recevied in Chair    Transfers Overall transfer level: Needs assistance Equipment used: Rolling walker (2 wheels) Transfers: Sit to/from Stand Sit to Stand: Min guard           General transfer comment: Needs VC for hand and feet placement    Ambulation/Gait Ambulation/Gait assistance: Min assist Gait Distance (Feet): 6 Feet Assistive device: Rolling walker (2 wheels) Gait Pattern/deviations: Step-through pattern, Decreased stride length       General Gait Details: Steady with heavy reliance on FWW.  Stairs            Wheelchair Mobility    Modified Rankin (Stroke Patients Only)       Balance Overall balance assessment: Needs assistance Sitting-balance support: Feet supported Sitting balance-Leahy Scale: Good     Standing balance support: Bilateral upper extremity supported Standing balance-Leahy Scale: Good Standing balance comment: Pt needs VC but steady on fee with AD.  Pertinent Vitals/Pain Pain Assessment Pain Assessment: No/denies pain    Home Living Family/patient expects to be discharged to:: Assisted living                         Prior Function Prior Level of Function : Patient poor historian/Family not available;Needs assist  Cognitive Assist : Mobility (cognitive);ADLs (cognitive) Mobility (Cognitive): Step by step cues ADLs (Cognitive): Step by step cues Physical Assist : Mobility (physical) Mobility (physical): Bed mobility;Transfers;Gait   Mobility Comments: Pt needs cues fro sequencing and directions       Hand Dominance        Extremity/Trunk Assessment   Upper Extremity Assessment Upper Extremity Assessment: Generalized weakness    Lower Extremity Assessment Lower Extremity Assessment: Generalized weakness       Communication   Communication: No difficulties;HOH  Cognition Arousal/Alertness: Awake/alert Behavior During Therapy: WFL for tasks assessed/performed Overall Cognitive Status: History of cognitive impairments - at baseline                                 General Comments: Pt follows simple copmmnads with conistent VC.        General Comments      Exercises     Assessment/Plan    PT Assessment Patient needs continued PT services  PT Problem List Decreased strength;Decreased activity tolerance;Decreased balance;Decreased safety awareness;Decreased cognition;Decreased mobility       PT Treatment Interventions Gait training;Functional mobility training;Therapeutic exercise;Therapeutic activities;Balance training;Neuromuscular re-education;Cognitive remediation;Patient/family education    PT Goals (Current goals can be found in the Care Plan section)  Acute Rehab PT Goals Patient Stated Goal: Unable PT Goal Formulation: With patient Time For Goal Achievement: 06/26/22 Potential to Achieve Goals: Fair    Frequency Min 2X/week     Co-evaluation               AM-PAC PT "6 Clicks" Mobility  Outcome Measure Help needed turning from your back to your side while in a flat bed without using bedrails?: A Little Help needed moving from  lying on your back to sitting on the side of a flat bed without using bedrails?: A Little Help needed moving to and from a bed to a chair (including a wheelchair)?: A Little Help needed standing up from a chair using your arms (e.g., wheelchair or bedside chair)?: A Little Help needed to walk in hospital room?: A Little Help needed climbing 3-5 steps with a railing? : Total 6 Click Score: 16    End of Session Equipment Utilized During Treatment: Gait belt Activity Tolerance: Patient tolerated treatment well Patient left: in chair;with call bell/phone within reach;with chair alarm set Nurse Communication: Mobility status PT Visit Diagnosis: Other abnormalities of gait and mobility (R26.89);Muscle weakness (generalized) (M62.81);Difficulty in walking, not elsewhere classified (R26.2)    Time: [id number] PT Time Calculation (min) (ACUTE ONLY): 22 min   Charges:   PT Evaluation $PT Eval Low Complexity: 1 Low         Whitaker Holderman PT DPT 3:56 PM,06/12/22

## 2022-06-12 NOTE — Progress Notes (Signed)
PHARMACY NOTE:  ANTIMICROBIAL RENAL DOSAGE ADJUSTMENT  Current antimicrobial regimen includes a mismatch between antimicrobial dosage and estimated renal function.  As per policy approved by the Pharmacy & Therapeutics and Medical Executive Committees, the antimicrobial dosage will be adjusted accordingly.  Current antimicrobial dosage:  Ceftazidime 1g every 24 hours  Indication: UTI  Renal Function:  Estimated Creatinine Clearance: 45.1 mL/min (by C-G formula based on SCr of 1 mg/dL).  Antimicrobial dosage has been changed to:  Ceftazidime 1g x 1 dose followed by ceftazidime 500mg  every 12 hours.   Additional comments:   Thank you for allowing pharmacy to be a part of this patient's care.  Pernell Dupre, PharmD, BCPS Clinical Pharmacist 06/12/2022 9:51 AM

## 2022-06-13 ENCOUNTER — Encounter: Payer: Self-pay | Admitting: Internal Medicine

## 2022-06-13 DIAGNOSIS — G9341 Metabolic encephalopathy: Secondary | ICD-10-CM | POA: Diagnosis not present

## 2022-06-13 DIAGNOSIS — N390 Urinary tract infection, site not specified: Secondary | ICD-10-CM | POA: Diagnosis not present

## 2022-06-13 DIAGNOSIS — R410 Disorientation, unspecified: Secondary | ICD-10-CM | POA: Diagnosis not present

## 2022-06-13 LAB — CBC WITH DIFFERENTIAL/PLATELET
Abs Immature Granulocytes: 0.04 10*3/uL (ref 0.00–0.07)
Basophils Absolute: 0.1 10*3/uL (ref 0.0–0.1)
Basophils Relative: 1 %
Eosinophils Absolute: 0.3 10*3/uL (ref 0.0–0.5)
Eosinophils Relative: 5 %
HCT: 31.4 % — ABNORMAL LOW (ref 39.0–52.0)
Hemoglobin: 10.8 g/dL — ABNORMAL LOW (ref 13.0–17.0)
Immature Granulocytes: 1 %
Lymphocytes Relative: 24 %
Lymphs Abs: 1.5 10*3/uL (ref 0.7–4.0)
MCH: 32.3 pg (ref 26.0–34.0)
MCHC: 34.4 g/dL (ref 30.0–36.0)
MCV: 94 fL (ref 80.0–100.0)
Monocytes Absolute: 0.6 10*3/uL (ref 0.1–1.0)
Monocytes Relative: 9 %
Neutro Abs: 3.6 10*3/uL (ref 1.7–7.7)
Neutrophils Relative %: 60 %
Platelets: 239 10*3/uL (ref 150–400)
RBC: 3.34 MIL/uL — ABNORMAL LOW (ref 4.22–5.81)
RDW: 13 % (ref 11.5–15.5)
WBC: 6 10*3/uL (ref 4.0–10.5)
nRBC: 0 % (ref 0.0–0.2)

## 2022-06-13 LAB — BASIC METABOLIC PANEL
Anion gap: 6 (ref 5–15)
BUN: 15 mg/dL (ref 8–23)
CO2: 26 mmol/L (ref 22–32)
Calcium: 8.3 mg/dL — ABNORMAL LOW (ref 8.9–10.3)
Chloride: 108 mmol/L (ref 98–111)
Creatinine, Ser: 0.93 mg/dL (ref 0.61–1.24)
GFR, Estimated: >60 mL/min (ref >60)
Glucose, Bld: 100 mg/dL — ABNORMAL HIGH (ref 70–99)
Potassium: 3.8 mmol/L (ref 3.5–5.1)
Sodium: 140 mmol/L (ref 135–145)

## 2022-06-13 LAB — PROTIME-INR
INR: 1.8 — ABNORMAL HIGH (ref 0.8–1.2)
Prothrombin Time: 20.7 seconds — ABNORMAL HIGH (ref 11.4–15.2)

## 2022-06-13 NOTE — TOC Progression Note (Signed)
Transition of Care [facility]) - Progression Note    Patient Details  Name: Erik Larson MRN: [id number] Date of Birth: April 28, 1924  [facility]) CM/SW Contact  Gerilyn Pilgrim, LCSW Phone Number: 06/13/2022, 1:04 PM  Clinical Narrative:   CSW spoke with coordinator at [facility] who reported they would need to see patients notes before saying he can return. CSW to email this to [place] [facility].          Expected Discharge Plan and Services                                               Social Determinants of Health (SDOH) Interventions SDOH Screenings   Food Insecurity: No Food Insecurity (06/12/2022)  Housing: Low Risk  (06/12/2022)  Transportation Needs: No Transportation Needs (06/12/2022)  Utilities: Not At Risk (06/12/2022)  Financial Resource Strain: Low Risk  (07/12/2017)  Physical Activity: Inactive (07/12/2017)  Social Connections: Unknown (07/12/2017)  Stress: No Stress Concern Present (07/12/2017)  Tobacco Use: Medium Risk (06/13/2022)    Readmission Risk Interventions     No data to display

## 2022-06-13 NOTE — TOC Initial Note (Signed)
Transition of Care [facility]) - Initial/Assessment Note    Patient Details  Name: Erik Larson MRN: [id number] Date of Birth: 31-Jul-1924  Transition of Care [facility]) CM/SW Contact:    Gerilyn Pilgrim, LCSW Phone Number: 06/13/2022, 10:11 AM  Clinical Narrative:   CSW spoke with patients daughter [name] who reports that she is not agreeable for pt to go to rehab. She reports pt went to [facility] and reports pt had awful care there and does not want him returning. [name] reports pt has UTI'S monthly and he is always able to recover appropriately. [name] ensures [facility] will be fine taking him back in his current condition. CSW to contact [facility]. [name] reports she would like to use whatever home health they were using prior to admission. CSW to update team.            Patient Goals and CMS Choice            Expected Discharge Plan and Services                                              Prior Living Arrangements/Services                       Activities of Daily Living Home Assistive Devices/Equipment: Gilford Rile (specify type) (4 wheel) ADL Screening (condition at time of admission) Patient's cognitive ability adequate to safely complete daily activities?: Yes Is the patient deaf or have difficulty hearing?: Yes Does the patient have difficulty seeing, even when wearing glasses/contacts?: No Does the patient have difficulty concentrating, remembering, or making decisions?: No Patient able to express need for assistance with ADLs?: Yes Does the patient have difficulty dressing or bathing?: No Independently performs ADLs?: Yes (appropriate for developmental age) Communication: Independent Dressing (OT): Independent Grooming: Independent Feeding: Independent Bathing: Independent Toileting: Independent In/Out Bed: Independent Walks in Home: Independent Does the patient have difficulty walking or climbing stairs?: Yes Weakness of Legs: None Weakness of Arms/Hands:  None  Permission Sought/Granted                  Emotional Assessment              Admission diagnosis:  Altered mental status, unspecified altered mental status type [R41.82] AMS (altered mental status) [R41.82] Patient Active Problem List   Diagnosis Date Noted   Benign prostatic hyperplasia with lower urinary tract symptoms 10/20/2021   Essential hypertension 10/20/2021   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   Polypharmacy 08/17/2021   Altered mental status 08/15/2021   Moderate dementia 07/25/2021   Anemia 06/13/2021   Hyperuricemia 10/09/2015   BPH associated with nocturia 10/09/2015   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/29/2014   Interval gout 07/29/2014   Diverticulitis of colon 07/29/2014   Long term current use of anticoagulant therapy 07/29/2014   Atrial fibrillation, controlled ([place]) 07/29/2014   Acid reflux 07/29/2014   PCP:  Sofie Hartigan, MD Pharmacy:   [facility] [phone] [address], [place] [facility] [address] AT [address] [place] [address] Phone: [phone] Fax: [phone]  [facility] [place], [place] [address] [place] [address] Phone: [phone] Fax: [phone]  [id number] [place], [place]  [place] [address] Phone: [phone] Fax: [phone]  [employer], [place] [id number] [place] [id number] Phone: [phone] Fax: [phone]     Social Determinants of Health (SDOH) Social History: SDOH Screenings   Food Insecurity: No Food Insecurity (06/12/2022)  Housing: Low Risk  (06/12/2022)  Transportation Needs: No Transportation Needs (06/12/2022)  Utilities: Not At Risk (06/12/2022)  Financial Resource Strain: Low Risk  (07/12/2017)  Physical Activity: Inactive (07/12/2017)  Social Connections: Unknown (07/12/2017)  Stress:  No Stress Concern Present (07/12/2017)  Tobacco Use: Medium Risk (06/13/2022)   SDOH Interventions:     Readmission Risk Interventions     No data to display

## 2022-06-13 NOTE — Progress Notes (Signed)
PT Cancellation Note  Patient Details Name: Erik Larson MRN: [id number] DOB: 17-Jun-1924   Cancelled Treatment:    Reason Eval/Treat Not Completed: Fatigue/lethargy limiting ability to participate  Pt in bed, asleep but awakens some to answer questions.  Does not open eyes or make attempt to engage in session today.  Will return again tomorrow.   Chesley Noon 06/13/2022, 2:34 PM

## 2022-06-13 NOTE — Progress Notes (Addendum)
Progress Note    Erik Larson  [id number] DOB: [birth year]  DOA: 06/11/2022 PCP: Sofie Hartigan, MD      Brief Narrative:    Medical records reviewed and are as summarized below:  Erik Larson is a 89 y.o. male with medical history significant for dementia, BPH, atrial fibrillation on Coumadin, GERD, hyperlipidemia, legal blindness, chronic debility, who presented to the hospital because of acute change in mental status.         Assessment/Plan:   Principal Problem:   Acute UTI Active Problems:   Acute metabolic encephalopathy   Long term current use of anticoagulant therapy   Atrial fibrillation, controlled (HCC)   Acid reflux   Benign prostatic hyperplasia with lower urinary tract symptoms   Essential hypertension   Anemia    Body mass index is 28.07 kg/m.   Catheter associated UTI with history of BPH, s/p suprapubic catheter: Urine cultures growing Enterococcus faecalis and Corynebacterium species.  Follow-up urine culture sensitivity report.  Continue empiric IV antibiotics.   BPH, s/p suprapubic catheter: Continue finasteride and Flomax   Acute metabolic encephalopathy with underlying dementia: No acute abnormality on CT head.  Continue supportive care.   Permanent atrial fibrillation: Rate controlled.  INR is therapeutic.  Continue Coumadin.  Monitor INR and adjust Coumadin dose accordingly.   Other comorbidities include legal blindness, chronic debility, CAD, hyperlipidemia   Diet Order             DIET - DYS 1 Room service appropriate? Yes with Assist; Fluid consistency: Nectar Thick  Diet effective now                            Consultants: None  Procedures: None    Medications:    finasteride  5 mg Oral Daily   risperiDONE  0.5 mg Oral QHS   sodium chloride flush  3 mL Intravenous Q12H   tamsulosin  0.4 mg Oral Daily   warfarin  7.5 mg Oral q1600   Followed by   Derrill Memo ON 06/14/2022] warfarin  6 mg  Oral q1600   Warfarin - Pharmacist Dosing Inpatient   Does not apply q1600   Continuous Infusions:  cefTAZidime (FORTAZ)  IV 0.5 g (06/13/22 1125)     Anti-infectives (From admission, onward)    Start     Dose/Rate Route Frequency Ordered Stop   06/12/22 2200  cefTAZidime (FORTAZ) 0.5 g in dextrose 5 % 50 mL IVPB        0.5 g 100 mL/hr over 30 Minutes Intravenous Every 12 hours 06/12/22 0947     06/12/22 1045  cefTAZidime (FORTAZ) 1 g in sodium chloride 0.9 % 100 mL IVPB        1 g 200 mL/hr over 30 Minutes Intravenous  Once 06/12/22 0947 06/12/22 1121   06/12/22 1015  cefTAZidime (FORTAZ) 1 g in sodium chloride 0.9 % 100 mL IVPB  Status:  Discontinued        1 g 200 mL/hr over 30 Minutes Intravenous Every 24 hours 06/12/22 0926 06/12/22 0931   06/11/22 2230  cefTAZidime (FORTAZ) injection 1 g  Status:  Discontinued        1 g Intramuscular  Once 06/11/22 2220 06/11/22 2226   06/11/22 2230  cefTAZidime (FORTAZ) 1 g in sodium chloride 0.9 % 100 mL IVPB        1 g 200 mL/hr over 30 Minutes Intravenous  Once 06/11/22  2226 06/12/22 0008              Family Communication/Anticipated D/C date and plan/Code Status   DVT prophylaxis:  warfarin (COUMADIN) tablet 7.5 mg  warfarin (COUMADIN) tablet 6 mg     Code Status: Full Code  Family Communication: None Disposition Plan: Plan to discharge to ALF tomorrow   Status is: Inpatient Remains inpatient appropriate because: Acute UTI       Subjective:   Interval events noted.  He is confused and cannot provide any history.  He does not report any pain at this time.  Objective:    Vitals:   06/13/22 0025 06/13/22 0428 06/13/22 0744 06/13/22 1138  BP: 121/77 (!) 143/88 (!) 152/76 (!) 118/51  Pulse: 72 85 (!) 59 72  Resp: 19 18 16 16   Temp: 98 F (36.7 C) 97.6 F (36.4 C) 98.4 F (36.9 C) 98.1 F (36.7 C)  TempSrc: Oral Oral    SpO2: 97% 99% 100% 100%  Weight:       No data found.   Intake/Output Summary  (Last 24 hours) at 06/13/2022 1248 Last data filed at 06/13/2022 [id number] Gross per 24 hour  Intake 949.88 ml  Output 1525 ml  Net -575.12 ml   Filed Weights   06/12/22 0428  Weight: 81.3 kg    Exam:  GEN: NAD SKIN: Warm and dry EYES: No pallor or icterus ENT: MMM CV: RRR PULM: CTA B ABD: soft, ND, NT, +BS CNS: AAO x 1 (person), non focal EXT: No edema or tenderness GU: Suprapubic catheter draining amber urine       Data Reviewed:   I have personally reviewed following labs and imaging studies:  Labs: Labs show the following:   Basic Metabolic Panel: Recent Labs  Lab 06/11/22 1950 06/12/22 0616 06/13/22 0517  NA 134* 139 140  K 4.0 3.8 3.8  CL 105 108 108  CO2 22 25 26   GLUCOSE 172* 103* 100*  BUN 21 15 15   CREATININE 1.03 1.00 0.93  CALCIUM 8.3* 8.3* 8.3*   GFR Estimated Creatinine Clearance: 48.5 mL/min (by C-G formula based on SCr of 0.93 mg/dL). Liver Function Tests: Recent Labs  Lab 06/11/22 1950 06/12/22 0616  AST 26 23  ALT 14 12  ALKPHOS 104 91  BILITOT 0.5 0.5  PROT 7.2 6.7  ALBUMIN 3.3* 3.1*   No results for input(s): "LIPASE", "AMYLASE" in the last 168 hours. No results for input(s): "AMMONIA" in the last 168 hours. Coagulation profile Recent Labs  Lab 06/11/22 2254 06/12/22 0616 06/13/22 0517  INR 1.4* 1.6* 1.8*    CBC: Recent Labs  Lab 06/11/22 1950 06/12/22 0616 06/13/22 0517  WBC 10.5 7.4 6.0  NEUTROABS 8.2*  --  3.6  HGB 11.6* 10.8* 10.8*  HCT 33.9* 31.7* 31.4*  MCV 93.6 93.0 94.0  PLT 270 259 239   Cardiac Enzymes: No results for input(s): "CKTOTAL", "CKMB", "CKMBINDEX", "TROPONINI" in the last 168 hours. BNP (last 3 results) No results for input(s): "PROBNP" in the last 8760 hours. CBG: No results for input(s): "GLUCAP" in the last 168 hours. D-Dimer: No results for input(s): "DDIMER" in the last 72 hours. Hgb A1c: No results for input(s): "HGBA1C" in the last 72 hours. Lipid Profile: No results for  input(s): "CHOL", "HDL", "LDLCALC", "TRIG", "CHOLHDL", "LDLDIRECT" in the last 72 hours. Thyroid function studies: No results for input(s): "TSH", "T4TOTAL", "T3FREE", "THYROIDAB" in the last 72 hours.  Invalid input(s): "FREET3" Anemia work up: No results for input(s): "VITAMINB12", "  FOLATE", "FERRITIN", "TIBC", "IRON", "RETICCTPCT" in the last 72 hours. Sepsis Labs: Recent Labs  Lab 06/11/22 1950 06/11/22 2254 06/12/22 0147 06/12/22 0616 06/13/22 0517  WBC 10.5  --   --  7.4 6.0  LATICACIDVEN  --  1.9 1.9  --   --     Microbiology Recent Results (from the past 240 hour(s))  Urine Culture     Status: Abnormal (Preliminary result)   Collection Time: 06/11/22  9:05 PM   Specimen: Urine, Suprapubic  Result Value Ref Range Status   Specimen Description   Final    URINE, SUPRAPUBIC Performed at [facility], [address], [place], [place] [address]    Special Requests   Final    NONE Performed at [facility], [address] [place], [place] [address]    Culture (A)  Final    >=100,000 COLONIES/mL ENTEROCOCCUS FAECALIS SUSCEPTIBILITIES TO FOLLOW 80,000 COLONIES/mL DIPHTHEROIDS(CORYNEBACTERIUM SPECIES) Standardized susceptibility testing for this organism is not available. Performed at [facility], [place] [address] [place], [place] [address]    Report Status PENDING  Incomplete  Blood culture (routine x 2)     Status: None (Preliminary result)   Collection Time: 06/11/22 10:54 PM   Specimen: BLOOD  Result Value Ref Range Status   Specimen Description BLOOD BLOOD RIGHT ARM  Final   Special Requests   Final    BOTTLES DRAWN AEROBIC AND ANAEROBIC Blood Culture adequate volume   Culture   Final    NO GROWTH 2 DAYS Performed at [facility], [address], [place], [place] [address]    Report Status PENDING  Incomplete  Blood culture (routine x 2)     Status: None (Preliminary result)   Collection Time: 06/12/22  1:47 AM   Specimen:  BLOOD  Result Value Ref Range Status   Specimen Description BLOOD BLOOD LEFT ARM  Final   Special Requests   Final    BOTTLES DRAWN AEROBIC AND ANAEROBIC Blood Culture adequate volume   Culture   Final    NO GROWTH 1 DAY Performed at [facility], [address], [place],  [address]    Report Status PENDING  Incomplete    Procedures and diagnostic studies:  CT HEAD WO CONTRAST (5MM)  Result Date: 06/11/2022 CLINICAL DATA:  Altered level of consciousness, disoriented, recent urinary tract infection EXAM: CT HEAD WITHOUT CONTRAST TECHNIQUE: Contiguous axial images were obtained from the base of the skull through the vertex without intravenous contrast. RADIATION DOSE REDUCTION: This exam was performed according to the departmental dose-optimization program which includes automated exposure control, adjustment of the mA and/or kV according to patient size and/or use of iterative reconstruction technique. COMPARISON:  09/25/2021 FINDINGS: Brain: Stable chronic small-vessel ischemic changes throughout the periventricular white matter. No acute infarct or hemorrhage. Lateral ventricles and midline structures are stable. Cavum septum pellucidum and vergae again noted. No acute extra-axial fluid collection. No mass effect. Vascular: Diffuse atherosclerosis of the internal carotid arteries unchanged. No hyperdense vessel. Skull: Normal. Negative for fracture or focal lesion. Sinuses/Orbits: No acute finding. Other: None. IMPRESSION: 1. Stable head CT, no acute intracranial process. Electronically Signed   By: Randa Ngo M.D.   On: 06/11/2022 20:31   DG Chest Portable 1 View  Result Date: 06/11/2022 CLINICAL DATA:  Rule out pneumonia EXAM: PORTABLE CHEST 1 VIEW COMPARISON:  09/25/2021 FINDINGS: Mild cardiomegaly. Aortic atherosclerosis. Low lung volumes. Interstitial prominence within the lungs, similar to prior study. No confluent airspace  opacities or effusions. No acute bony  abnormality. IMPRESSION: Stable interstitial prominence which may reflect chronic lung disease. No confluent opacity to suggest pneumonia. Electronically Signed   By: Rolm Baptise M.D.   On: 06/11/2022 20:23               LOS: 2 days   [place] Stell  [facility]   Pager on [email or web address]. If 7PM-7AM, please contact night-coverage at [email or web address]     06/13/2022, 12:48 PM

## 2022-06-13 NOTE — Progress Notes (Signed)
ANTICOAGULATION CONSULT NOTE -   Pharmacy Consult for Warfarin  Indication: atrial fibrillation  Allergies  Allergen Reactions   Ace Inhibitors    Penicillins     Patient Measurements: Weight: 81.3 kg (179 lb 3.7 oz) Heparin Dosing Weight:   Vital Signs: Temp: 97.6 F (36.4 C) (04/01 0428) Temp Source: Oral (04/01 0428) BP: 143/88 (04/01 0428) Pulse Rate: 85 (04/01 0428)  Labs: Recent Labs    06/11/22 1950 06/11/22 2254 06/12/22 0616 06/13/22 0517  HGB 11.6*  --  10.8* 10.8*  HCT 33.9*  --  31.7* 31.4*  PLT 270  --  259 239  LABPROT  --  17.2* 18.9* 20.7*  INR  --  1.4* 1.6* 1.8*  CREATININE 1.03  --  1.00 0.93     Estimated Creatinine Clearance: 48.5 mL/min (by C-G formula based on SCr of 0.93 mg/dL).   Medical History: Past Medical History:  Diagnosis Date   Arthritis    Atrial fibrillation ([place])    controlled on coumadin   GERD (gastroesophageal reflux disease)    Glaucoma    Gout    Heart disease    Hyperlipidemia    Warfarin therapy started 07/29/2014    Medications:  Medications Prior to Admission  Medication Sig Dispense Refill Last Dose   acetaminophen (TYLENOL) 500 MG tablet Take 500 mg by mouth every 6 (six) hours as needed for mild pain or moderate pain.   06/10/2022 at 0731   allopurinol (ZYLOPRIM) 100 MG tablet Take 100 mg by mouth daily.   06/11/2022 at 0822   BACTRIM 400-80 MG tablet Take 1 tablet by mouth daily.   06/11/2022 at 0824   Cholecalciferol 50 MCG (2000 UT) TABS Take 2,000 Units by mouth daily.   06/11/2022 at 0822   Cranberry 450 MG TABS Take 450 mg by mouth daily.   06/11/2022 at 0822   dorzolamide-timolol (COSOPT) 2-0.5 % ophthalmic solution Place 1 drop into both eyes 2 (two) times daily.   06/11/2022 at 0821   finasteride (PROSCAR) 5 MG tablet Take 1 tablet (5 mg total) by mouth daily. 90 tablet 1 06/11/2022 at 0822   latanoprost (XALATAN) 0.005 % ophthalmic solution Place 1 drop into both eyes at bedtime. Dr Atilano Median   06/10/2022  at 2127   melatonin 3 MG TABS tablet Take 3 mg by mouth at bedtime as needed.   prn at prn   mupirocin ointment (BACTROBAN) 2 % Apply 1 Application topically 2 (two) times daily. 60 g 0 06/11/2022 at 0822   phenazopyridine (PYRIDIUM) 95 MG tablet Take 95 mg by mouth 3 (three) times daily as needed (urinary spasms).   prn at prn   psyllium (REGULOID) 0.52 g capsule Take 0.52 g by mouth at bedtime.   06/10/2022 at 2127   risperiDONE (RISPERDAL) 0.5 MG tablet Take 0.5 mg by mouth at bedtime.   06/10/2022 at 2127   sennosides-docusate sodium (SENOKOT-S) 8.6-50 MG tablet Take one tablet by mouth at bedtime on Mondays, Wednesdays, and Fridays.   06/10/2022 at 2127   silodosin (RAPAFLO) 8 MG CAPS capsule Take 8 mg by mouth 2 (two) times daily.   06/11/2022 at 0822   tamsulosin (FLOMAX) 0.4 MG CAPS capsule Take 0.4 mg by mouth daily.   06/11/2022 at 0822   traMADol (ULTRAM) 50 MG tablet Take 25 mg by mouth 2 (two) times daily. For 7 days.      warfarin (COUMADIN) 7.5 MG tablet Take 7.5 mg by mouth daily. For 5 days.  06/11/2022 at 1709   allopurinol (ZYLOPRIM) 100 MG tablet Take 1 tablet (100 mg total) by mouth 2 (two) times daily. (Patient not taking: Reported on 06/11/2022) 180 tablet 1 Not Taking    Assessment: Pharmacy consulted to manage warfarin for Afib in this 89 year old male admitted with AMS, UTI.  Pt home warfarin schedule is unclear but appears to have been on warfarin since at least 08/2021.   Doctor First note says pt was ordered warfarin 7.5 mg PO daily X 5 days on 3/28 and then to resume 6 mg PO daily.     Last dose :  Warfarin 7.5 mg PO on 3/30.    Date: INR: Dose: 3/30 1.4 4mg  STAT ordered(since pt came in evening of 3/30) 3/31 1.6 7.5mg (of note, TDD was 11.5mg ) 4/1 1.8    Goal of Therapy:  INR 2-3   Plan:  Will reorder home dose of Warfarin 7.5mg  x 1 this afternoon Expect increase in INR tomorrow Will check INR daily CBC per protocol  Catelynn Sparger A Kron Everton 06/13/2022,7:28 AM

## 2022-06-14 DIAGNOSIS — N390 Urinary tract infection, site not specified: Secondary | ICD-10-CM | POA: Diagnosis not present

## 2022-06-14 LAB — URINE CULTURE: Culture: >=100000 — AB

## 2022-06-14 LAB — PROTIME-INR
INR: 2 — ABNORMAL HIGH (ref 0.8–1.2)
Prothrombin Time: 22.4 seconds — ABNORMAL HIGH (ref 11.4–15.2)

## 2022-06-14 MED ORDER — NITROFURANTOIN MONOHYD MACRO 100 MG PO CAPS: 100.0000 mg | ORAL_CAPSULE | Freq: Two times a day (BID) | ORAL | 0 refills | Status: AC

## 2022-06-14 MED ORDER — NITROFURANTOIN MONOHYD MACRO 100 MG PO CAPS
100.0000 mg | ORAL_CAPSULE | Freq: Two times a day (BID) | ORAL | Status: DC
  Administered 2022-06-14: 100 mg via ORAL
  Filled 2022-06-14 (×2): qty 1

## 2022-06-14 MED ORDER — WARFARIN SODIUM 7.5 MG PO TABS
7.5000 mg | ORAL_TABLET | Freq: Once | ORAL | Status: AC
  Administered 2022-06-14: 7.5 mg via ORAL
  Filled 2022-06-14: qty 1

## 2022-06-14 NOTE — Progress Notes (Signed)
Physical Therapy Treatment Patient Details Name: Erik Larson MRN: [id number] DOB: 08-27-1924 Today's Date: 06/14/2022   History of Present Illness Erik Larson is an 89 y.o. male with PMH disorientation altered mental status patient found to be wanting creatinine Magda Paganini was cooperative but disoriented.  Patient has a history of dementia, urinary retention status post suprapubic catheter placed by IR 07 June 2022.  Facility was concerned that patient confused and brought to the hospital.  No report of palpitations, chest pain shortness of breath or any other issues.  Patient has had hallucinations where he is seeing people that were not they are sitting in the chair.  Patient does report getting confused when he has a urinary tract infection.  Patient sees urology Dr. Bernardo Heater. Presentation is not clear as far as source but suspect urinary as pt has h/o rec uti from urinary retention.    PT Comments    Pt awake, ready for session.  OOB with min guard.  Stood and is able to complete x 1 lap on unit with RW and min assist for guiding walker due to poor vision.  After short seated rest, standing AROM with walker.  Pt is progressing well with mobility and only needs +1 for guidance due to vision.  Pt lives ALF and would be reasonable to return if they can provide assist for mobility due to vision.  No LOB or buckling noted with gait.  Will discuss with team.  Discharge recommendations and frequency updated to reflect change to HHPT.  If ALF cannot provide support SNF would be appropriate if pt is not at baseline per ALF.   Recommendations for follow up therapy are one component of a multi-disciplinary discharge planning process, led by the attending physician.  Recommendations may be updated based on patient status, additional functional criteria and insurance authorization.  Follow Up Recommendations       Assistance Recommended at Discharge Intermittent Supervision/Assistance  Patient can return  home with the following A little help with walking and/or transfers;A little help with bathing/dressing/bathroom;Assistance with cooking/housework;Assistance with feeding;Direct supervision/assist for medications management;Direct supervision/assist for financial management;Assist for transportation   Equipment Recommendations  None recommended by PT    Recommendations for Other Services       Precautions / Restrictions Precautions Precautions: Fall Restrictions Weight Bearing Restrictions: No     Mobility  Bed Mobility Overal bed mobility: Needs Assistance Bed Mobility: Supine to Sit     Supine to sit: Min guard       Patient Response: Cooperative  Transfers Overall transfer level: Needs assistance Equipment used: Rolling walker (2 wheels) Transfers: Sit to/from Stand Sit to Stand: Min guard                Ambulation/Gait Ambulation/Gait assistance: Herbalist (Feet): 180 Feet Assistive device: Rolling walker (2 wheels) Gait Pattern/deviations: Step-through pattern, Decreased stride length Gait velocity: WFL     General Gait Details: min assist for guiding walker due to poor vision   Stairs             Wheelchair Mobility    Modified Rankin (Stroke Patients Only)       Balance Overall balance assessment: Needs assistance Sitting-balance support: Feet supported Sitting balance-Leahy Scale: Good     Standing balance support: Bilateral upper extremity supported Standing balance-Leahy Scale: Good  Cognition Arousal/Alertness: Awake/alert Behavior During Therapy: WFL for tasks assessed/performed Overall Cognitive Status: History of cognitive impairments - at baseline                                 General Comments: does well today        Exercises Other Exercises Other Exercises: standing AROM  with walker support    General Comments        Pertinent  Vitals/Pain Pain Assessment Pain Assessment: No/denies pain    Home Living                          Prior Function            PT Goals (current goals can now be found in the care plan section) Progress towards PT goals: Progressing toward goals    Frequency    Min 4X/week      PT Plan Current plan remains appropriate    Co-evaluation              AM-PAC PT "6 Clicks" Mobility   Outcome Measure  Help needed turning from your back to your side while in a flat bed without using bedrails?: None Help needed moving from lying on your back to sitting on the side of a flat bed without using bedrails?: A Little Help needed moving to and from a bed to a chair (including a wheelchair)?: A Little Help needed standing up from a chair using your arms (e.g., wheelchair or bedside chair)?: A Little Help needed to walk in hospital room?: A Little Help needed climbing 3-5 steps with a railing? : A Little 6 Click Score: 19    End of Session Equipment Utilized During Treatment: Gait belt Activity Tolerance: Patient tolerated treatment well Patient left: in chair;with call bell/phone within reach;with chair alarm set;with nursing/sitter in room Nurse Communication: Mobility status PT Visit Diagnosis: Other abnormalities of gait and mobility (R26.89);Muscle weakness (generalized) (M62.81);Difficulty in walking, not elsewhere classified (R26.2)     Time: [id number] PT Time Calculation (min) (ACUTE ONLY): 12 min  Charges:  $Gait Training: 8-22 mins                   Chesley Noon, PTA 06/14/22, 9:07 AM

## 2022-06-14 NOTE — Progress Notes (Signed)
ANTICOAGULATION CONSULT NOTE -   Pharmacy Consult for Warfarin  Indication: atrial fibrillation  Allergies  Allergen Reactions   Ace Inhibitors    Penicillins     Patient Measurements: Weight: 81.4 kg (179 lb 7.3 oz) Heparin Dosing Weight:   Vital Signs: Temp: 98.5 F (36.9 C) (04/02 0728) BP: 148/60 (04/02 0728) Pulse Rate: 52 (04/02 0728)  Labs: Recent Labs    06/11/22 1950 06/11/22 2254 06/12/22 0616 06/13/22 0517 06/14/22 0453  HGB 11.6*  --  10.8* 10.8*  --   HCT 33.9*  --  31.7* 31.4*  --   PLT 270  --  259 239  --   LABPROT  --    < > 18.9* 20.7* 22.4*  INR  --    < > 1.6* 1.8* 2.0*  CREATININE 1.03  --  1.00 0.93  --    < > = values in this interval not displayed.     Estimated Creatinine Clearance: 48.5 mL/min (by C-G formula based on SCr of 0.93 mg/dL).   Medical History: Past Medical History:  Diagnosis Date   Arthritis    Atrial fibrillation    controlled on coumadin   GERD (gastroesophageal reflux disease)    Glaucoma    Gout    Heart disease    Hyperlipidemia    Warfarin therapy started 07/29/2014    Medications:  Medications Prior to Admission  Medication Sig Dispense Refill Last Dose   acetaminophen (TYLENOL) 500 MG tablet Take 500 mg by mouth every 6 (six) hours as needed for mild pain or moderate pain.   06/10/2022 at 0731   allopurinol (ZYLOPRIM) 100 MG tablet Take 100 mg by mouth daily.   06/11/2022 at 0822   BACTRIM 400-80 MG tablet Take 1 tablet by mouth daily.   06/11/2022 at 0824   Cholecalciferol 50 MCG (2000 UT) TABS Take 2,000 Units by mouth daily.   06/11/2022 at 0822   Cranberry 450 MG TABS Take 450 mg by mouth daily.   06/11/2022 at 0822   dorzolamide-timolol (COSOPT) 2-0.5 % ophthalmic solution Place 1 drop into both eyes 2 (two) times daily.   06/11/2022 at 0821   finasteride (PROSCAR) 5 MG tablet Take 1 tablet (5 mg total) by mouth daily. 90 tablet 1 06/11/2022 at 0822   latanoprost (XALATAN) 0.005 % ophthalmic solution Place  1 drop into both eyes at bedtime. Dr Atilano Median   06/10/2022 at 2127   melatonin 3 MG TABS tablet Take 3 mg by mouth at bedtime as needed.   prn at prn   mupirocin ointment (BACTROBAN) 2 % Apply 1 Application topically 2 (two) times daily. 60 g 0 06/11/2022 at 0822   phenazopyridine (PYRIDIUM) 95 MG tablet Take 95 mg by mouth 3 (three) times daily as needed (urinary spasms).   prn at prn   psyllium (REGULOID) 0.52 g capsule Take 0.52 g by mouth at bedtime.   06/10/2022 at 2127   risperiDONE (RISPERDAL) 0.5 MG tablet Take 0.5 mg by mouth at bedtime.   06/10/2022 at 2127   sennosides-docusate sodium (SENOKOT-S) 8.6-50 MG tablet Take one tablet by mouth at bedtime on Mondays, Wednesdays, and Fridays.   06/10/2022 at 2127   silodosin (RAPAFLO) 8 MG CAPS capsule Take 8 mg by mouth 2 (two) times daily.   06/11/2022 at 0822   tamsulosin (FLOMAX) 0.4 MG CAPS capsule Take 0.4 mg by mouth daily.   06/11/2022 at 0822   traMADol (ULTRAM) 50 MG tablet Take 25 mg by mouth 2 (  two) times daily. For 7 days.      warfarin (COUMADIN) 7.5 MG tablet Take 7.5 mg by mouth daily. For 5 days.   06/11/2022 at 1709   allopurinol (ZYLOPRIM) 100 MG tablet Take 1 tablet (100 mg total) by mouth 2 (two) times daily. (Patient not taking: Reported on 06/11/2022) 180 tablet 1 Not Taking    Assessment: Pharmacy consulted to manage warfarin for Afib in this 89 year old male admitted with AMS, UTI.  Pt home warfarin schedule is unclear but appears to have been on warfarin since at least 08/2021.   Doctor First note says pt was ordered warfarin 7.5 mg PO daily X 5 days on 3/28 and then to resume 6 mg PO daily.     Last dose :  Warfarin 7.5 mg PO on 3/30.    Date: INR: Dose: 3/30 1.4 4mg  STAT ordered(since pt came in evening of 3/30) 3/31 1.6 7.5mg (of note, TDD was 11.5mg ) 4/1 1.8 7.5mg  4/2 2.0   Goal of Therapy:  INR 2-3   Plan:  Will reorder home dose of Warfarin 7.5mg  x 1 this afternoon Expect increase in INR tomorrow Will check INR  daily CBC per protocol  Shadawn Hanaway A Oluwakemi Salsberry 06/14/2022,7:34 AM

## 2022-06-14 NOTE — Discharge Summary (Signed)
Physician Discharge Summary   Patient: Erik Larson MRN: [id number] DOB: [birth year]  Admit date:     06/11/2022  Discharge date: 06/14/22  Discharge Physician: Jennye Boroughs   PCP: Sofie Hartigan, MD   Recommendations at discharge:   Follow-up with PCP in 1 week  Discharge Diagnoses: Principal Problem:   Acute UTI Active Problems:   Acute metabolic encephalopathy   Long term current use of anticoagulant therapy   Atrial fibrillation, controlled (HCC)   Acid reflux   Benign prostatic hyperplasia with lower urinary tract symptoms   Essential hypertension   Anemia  Resolved Problems:   Warfarin therapy started   AMS (altered mental status)  Hospital Course:  Erik Larson is a 89 y.o. male with medical history significant for dementia, BPH, atrial fibrillation on Coumadin, GERD, hyperlipidemia, legal blindness, chronic debility, who presented to the hospital because of acute change in mental status.   He was admitted to the hospital for acute urinary tract infection.  He was treated with IV ceftazidime.  Urine culture showed Enterococcus faecalis and corynebacterium species.  He was transitioned to nitrofurantoin based on urine culture sensitivity report.  He feels better and he is deemed stable for discharge to home today.  Discharge plan was discussed with Ms. Lucina Mellow, daughter, over the phone.  Assessment and Plan:   Catheter associated UTI with history of BPH, s/p suprapubic catheter: Urine cultures growing Enterococcus faecalis and Corynebacterium species.  Discharged on 5-day course of nitrofurantoin.     BPH, s/p suprapubic catheter: Continue finasteride and Flomax     Acute metabolic encephalopathy with underlying dementia: No acute abnormality on CT head.  Continue supportive care.     Permanent atrial fibrillation: Rate controlled.  INR is therapeutic.  Continue Coumadin.  Monitor INR and adjust Coumadin dose accordingly.     Other comorbidities  include legal blindness, chronic debility, CAD, hyperlipidemia         Consultants: None Procedures performed: None  Disposition: Assisted living Diet recommendation:  Discharge Diet Orders (From admission, onward)     Start     Ordered   06/14/22 0000  Diet - low sodium heart healthy        06/14/22 1358           Cardiac diet DISCHARGE MEDICATION: Allergies as of 06/14/2022       Reactions   Ace Inhibitors    Penicillins         Medication List     STOP taking these medications    silodosin 8 MG Caps capsule Commonly known as: RAPAFLO   traMADol 50 MG tablet Commonly known as: ULTRAM       TAKE these medications    acetaminophen 500 MG tablet Commonly known as: TYLENOL Take 500 mg by mouth every 6 (six) hours as needed for mild pain or moderate pain.   allopurinol 100 MG tablet Commonly known as: ZYLOPRIM Take 100 mg by mouth daily. What changed: Another medication with the same name was removed. Continue taking this medication, and follow the directions you see here.   Bactrim 400-80 MG tablet Generic drug: sulfamethoxazole-trimethoprim Take 1 tablet by mouth daily.   Cholecalciferol 50 MCG (2000 UT) Tabs Take 2,000 Units by mouth daily.   Cranberry 450 MG Tabs Take 450 mg by mouth daily.   dorzolamide-timolol 2-0.5 % ophthalmic solution Commonly known as: COSOPT Place 1 drop into both eyes 2 (two) times daily.   finasteride 5 MG tablet Commonly known  as: PROSCAR Take 1 tablet (5 mg total) by mouth daily.   latanoprost 0.005 % ophthalmic solution Commonly known as: XALATAN Place 1 drop into both eyes at bedtime. Dr Atilano Median   melatonin 3 MG Tabs tablet Take 3 mg by mouth at bedtime as needed.   mupirocin ointment 2 % Commonly known as: BACTROBAN Apply 1 Application topically 2 (two) times daily.   phenazopyridine 95 MG tablet Commonly known as: PYRIDIUM Take 95 mg by mouth 3 (three) times daily as needed (urinary spasms).    psyllium 0.52 g capsule Commonly known as: REGULOID Take 0.52 g by mouth at bedtime.   risperiDONE 0.5 MG tablet Commonly known as: RISPERDAL Take 0.5 mg by mouth at bedtime.   sennosides-docusate sodium 8.6-50 MG tablet Commonly known as: SENOKOT-S Take one tablet by mouth at bedtime on Mondays, Wednesdays, and Fridays.   tamsulosin 0.4 MG Caps capsule Commonly known as: FLOMAX Take 0.4 mg by mouth daily.   warfarin 7.5 MG tablet Commonly known as: COUMADIN Take 7.5 mg by mouth daily. For 5 days.        Discharge Exam: Filed Weights   06/12/22 0428 06/14/22 0500  Weight: 81.3 kg 81.4 kg   GEN: NAD SKIN: Warm and dry EYES: No pallor or icterus, legally blind ENT: MMM CV: RRR PULM: CTA B ABD: soft, ND, NT, +BS CNS: AAO x 2 (person and place), non focal EXT: No edema or tenderness GU: Suprapubic catheter draining amber urine   Condition at discharge: good  The results of significant diagnostics from this hospitalization (including imaging, microbiology, ancillary and laboratory) are listed below for reference.   Imaging Studies: CT HEAD WO CONTRAST (5MM)  Result Date: 06/11/2022 CLINICAL DATA:  Altered level of consciousness, disoriented, recent urinary tract infection EXAM: CT HEAD WITHOUT CONTRAST TECHNIQUE: Contiguous axial images were obtained from the base of the skull through the vertex without intravenous contrast. RADIATION DOSE REDUCTION: This exam was performed according to the departmental dose-optimization program which includes automated exposure control, adjustment of the mA and/or kV according to patient size and/or use of iterative reconstruction technique. COMPARISON:  09/25/2021 FINDINGS: Brain: Stable chronic small-vessel ischemic changes throughout the periventricular white matter. No acute infarct or hemorrhage. Lateral ventricles and midline structures are stable. Cavum septum pellucidum and vergae again noted. No acute extra-axial fluid  collection. No mass effect. Vascular: Diffuse atherosclerosis of the internal carotid arteries unchanged. No hyperdense vessel. Skull: Normal. Negative for fracture or focal lesion. Sinuses/Orbits: No acute finding. Other: None. IMPRESSION: 1. Stable head CT, no acute intracranial process. Electronically Signed   By: Randa Ngo M.D.   On: 06/11/2022 20:31   DG Chest Portable 1 View  Result Date: 06/11/2022 CLINICAL DATA:  Rule out pneumonia EXAM: PORTABLE CHEST 1 VIEW COMPARISON:  09/25/2021 FINDINGS: Mild cardiomegaly. Aortic atherosclerosis. Low lung volumes. Interstitial prominence within the lungs, similar to prior study. No confluent airspace opacities or effusions. No acute bony abnormality. IMPRESSION: Stable interstitial prominence which may reflect chronic lung disease. No confluent opacity to suggest pneumonia. Electronically Signed   By: Rolm Baptise M.D.   On: 06/11/2022 20:23   IR IMAGE GUIDED DRAINAGE BY PERCUTANEOUS CATHETER  Result Date: 06/07/2022 INDICATION: Chronic urinary retention EXAM: Placement of suprapubic tube using ultrasound and fluoroscopic guidance COMPARISON:  None Available. MEDICATIONS: Documented in the EMR ANESTHESIA/SEDATION: Moderate (conscious) sedation was employed during this procedure. A total of Versed 0.5 mg and Fentanyl 50 mcg was administered intravenously. Moderate Sedation Time: 10 minutes. The patient's  level of consciousness and vital signs were monitored continuously by radiology nursing throughout the procedure under my direct supervision. CONTRAST:  5 mL Omnipaque 300-administered into the collecting system(s) FLUOROSCOPY TIME:  Fluoroscopy Time: 1.4 minutes (22 mGy) COMPLICATIONS: None immediate. PROCEDURE: Informed written consent was obtained from the patient after a thorough discussion of the procedural risks, benefits and alternatives. All questions were addressed. Maximal Sterile Barrier Technique was utilized including caps, mask, sterile gowns,  sterile gloves, sterile drape, hand hygiene and skin antiseptic. A timeout was performed prior to the initiation of the procedure. The patient was placed supine on the exam table. The skin overlying the lower pelvis was prepped and draped in the standard sterile fashion. Ultrasound was used to identify the urinary bladder. A Foley catheter was in place. Skin entry site was marked, and local analgesia was obtained with 1% lidocaine. Using ultrasound guidance, the right puncture was made of the urinary bladder using a 21 gauge Chiba needle. Needle entry was visualized. An image was stored in the permanent electronic medical record. An 018 wire was then advanced and looped in the urinary bladder confirmed with fluoroscopy. Using a transitional dilator, an Amplatz wire was looped in the urinary bladder, followed by serial tract dilation and placement of a 16 French locking drainage catheter. Location was confirmed with return of urine and injection of contrast material opacifying the urinary bladder. The drainage catheter was secured to the skin using silk suture and a dressing. It was placed to bag drainage. The patient tolerated the procedure well without immediate complication. IMPRESSION: Successful placement of a 16 French suprapubic drainage catheter using ultrasound and fluoroscopic guidance. Plan for patient to return to Interventional Radiology in 2-3 weeks for planned exchange to 16 French Foley catheter. Electronically Signed   By: Albin Felling M.D.   On: 06/07/2022 11:48    Microbiology: Results for orders placed or performed during the hospital encounter of 06/11/22  Urine Culture     Status: Abnormal   Collection Time: 06/11/22  9:05 PM   Specimen: Urine, Suprapubic  Result Value Ref Range Status   Specimen Description   Final    URINE, SUPRAPUBIC Performed at [facility], [address], [place], [place] [address]    Special Requests   Final    NONE Performed at [facility], [address] [place], [place] [address]    Culture (A)  Final    >=100,000 COLONIES/mL ENTEROCOCCUS FAECALIS 80,000 COLONIES/mL DIPHTHEROIDS(CORYNEBACTERIUM SPECIES) Standardized susceptibility testing for this organism is not available. Performed at [facility], [place] [address] [place], [place] [address]    Report Status 06/14/2022 FINAL  Final   Organism ID, Bacteria ENTEROCOCCUS FAECALIS (A)  Final      Susceptibility   Enterococcus faecalis - MIC*    AMPICILLIN <=2 SENSITIVE Sensitive     NITROFURANTOIN <=16 SENSITIVE Sensitive     VANCOMYCIN 1 SENSITIVE Sensitive     * >=100,000 COLONIES/mL ENTEROCOCCUS FAECALIS  Blood culture (routine x 2)     Status: None (Preliminary result)   Collection Time: 06/11/22 10:54 PM   Specimen: BLOOD  Result Value Ref Range Status   Specimen Description BLOOD BLOOD RIGHT ARM  Final   Special Requests   Final    BOTTLES DRAWN AEROBIC AND ANAEROBIC Blood Culture adequate volume   Culture   Final    NO GROWTH 3 DAYS Performed at [facility], [address], [place], [place] [address]    Report  Status PENDING  Incomplete  Blood culture (routine x 2)     Status: None (Preliminary result)   Collection Time: 06/12/22  1:47 AM   Specimen: BLOOD  Result Value Ref Range Status   Specimen Description BLOOD BLOOD LEFT ARM  Final   Special Requests   Final    BOTTLES DRAWN AEROBIC AND ANAEROBIC Blood Culture adequate volume   Culture   Final    NO GROWTH 2 DAYS Performed at [facility], [place], [place] [address]    Report Status PENDING  Incomplete    Labs: CBC: Recent Labs  Lab 06/11/22 1950 06/12/22 0616 06/13/22 0517  WBC 10.5 7.4 6.0  NEUTROABS 8.2*  --  3.6  HGB 11.6* 10.8* 10.8*  HCT 33.9* 31.7* 31.4*  MCV 93.6 93.0 94.0  PLT 270 259 [id number]   Basic Metabolic Panel: Recent Labs  Lab 06/11/22 1950 06/12/22 0616 06/13/22 0517  NA 134* 139 140  K 4.0 3.8 3.8  CL 105  108 108  CO2 22 25 26   GLUCOSE 172* 103* 100*  BUN 21 15 15   CREATININE 1.03 1.00 0.93  CALCIUM 8.3* 8.3* 8.3*   Liver Function Tests: Recent Labs  Lab 06/11/22 1950 06/12/22 0616  AST 26 23  ALT 14 12  ALKPHOS 104 91  BILITOT 0.5 0.5  PROT 7.2 6.7  ALBUMIN 3.3* 3.1*   CBG: No results for input(s): "GLUCAP" in the last 168 hours.  Discharge time spent: greater than 30 minutes.  Signed: Jennye Boroughs, MD [facility] 06/14/2022

## 2022-06-14 NOTE — NC FL2 (Signed)
[place] LEVEL OF CARE FORM     IDENTIFICATION  Patient Name: Erik Larson Birthdate: Jun 28, 1924 Sex: male Admission Date (Current Location): 06/11/2022  [facility] and [place] Number:  Engineering geologist and Address:  [facility], [address], [place], [place] [address]      Provider Number: [id number]  Attending Physician Name and Address:  Jennye Boroughs, MD  Relative Name and Phone Number:  Boneta Lucks (Daughter) [phone]    Current Level of Care: Hospital Recommended Level of Care: Assisted Living Facility Prior Approval Number:    Date Approved/Denied:   PASRR Number:    Discharge Plan: Domiciliary (Rest home)    Current Diagnoses: Patient Active Problem List   Diagnosis Date Noted   Acute UTI 06/13/2022   Benign prostatic hyperplasia with lower urinary tract symptoms 10/20/2021   Essential hypertension 10/20/2021   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   Polypharmacy [id number]   Acute metabolic encephalopathy [id number]   Moderate dementia 07/25/2021   Anemia 06/13/2021   Hyperuricemia 10/09/2015   BPH associated with nocturia 10/09/2015   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/29/2014   Interval gout 07/29/2014   Diverticulitis of colon 07/29/2014   Long term current use of anticoagulant therapy 07/29/2014   Atrial fibrillation, controlled ([place]) 07/29/2014   Acid reflux 07/29/2014    Orientation RESPIRATION BLADDER Height & Weight     Place, Self  Normal Incontinent (Suprapubic Cath) Weight: 179 lb 7.3 oz (81.4 kg) Height:     BEHAVIORAL SYMPTOMS/MOOD NEUROLOGICAL BOWEL NUTRITION STATUS      Incontinent Diet (DYS 1)  AMBULATORY STATUS COMMUNICATION OF NEEDS Skin   Extensive Assist                           Personal Care Assistance Level of Assistance  Bathing, Feeding, Dressing Bathing Assistance: Maximum assistance Feeding assistance: Limited assistance Dressing Assistance:  Maximum assistance     Functional Limitations Info  Sight Sight Info: Impaired        SPECIAL CARE FACTORS FREQUENCY  PT (By licensed PT), OT (By licensed OT)     PT Frequency: 3 times a week OT Frequency: 3 times a week            Contractures Contractures Info: Not present    Additional Factors Info  Code Status, Allergies Code Status Info: FULL Allergies Info: Ace Inhibitors  Penicillins           Current Medications (06/14/2022):  This is the current hospital active medication list Current Facility-Administered Medications  Medication Dose Route Frequency Provider Last Rate Last Admin   acetaminophen (TYLENOL) tablet 650 mg  650 mg Oral Q6H PRN Para Skeans, MD       Or   acetaminophen (TYLENOL) suppository 650 mg  650 mg Rectal Q6H PRN Para Skeans, MD       finasteride (PROSCAR) tablet 5 mg  5 mg Oral Daily Florina Ou V, MD   5 mg at 06/14/22 0858   melatonin tablet 5 mg  5 mg Oral QHS PRN Para Skeans, MD   5 mg at 06/12/22 2107   nitrofurantoin (macrocrystal-monohydrate) (MACROBID) capsule 100 mg  100 mg Oral Q12H Jennye Boroughs, MD   100 mg at 06/14/22 1146   risperiDONE (RISPERDAL) tablet 0.5 mg  0.5 mg Oral QHS Florina Ou V, MD   0.5 mg at 06/13/22 2125   sodium chloride flush (NS) 0.9 % injection 3 mL  3 mL Intravenous Q12H Florina Ou V, MD   3 mL at 06/14/22 0901   tamsulosin (FLOMAX) capsule 0.4 mg  0.4 mg Oral Daily Florina Ou V, MD   0.4 mg at 06/14/22 [id number]   warfarin (COUMADIN) tablet 7.5 mg  7.5 mg Oral ONCE-1600 Pearla Dubonnet, RPH       Warfarin - Pharmacist Dosing Inpatient   Does not apply [id number] Para Skeans, MD   Given at 06/13/22 1500     Discharge Medications: Please see discharge summary for a list of discharge medications.  STOP taking these medications     silodosin 8 MG Caps capsule Commonly known as: RAPAFLO    traMADol 50 MG tablet Commonly known as: ULTRAM           TAKE these medications     acetaminophen 500 MG  tablet Commonly known as: TYLENOL Take 500 mg by mouth every 6 (six) hours as needed for mild pain or moderate pain.    allopurinol 100 MG tablet Commonly known as: ZYLOPRIM Take 100 mg by mouth daily. What changed: Another medication with the same name was removed. Continue taking this medication, and follow the directions you see here.    Bactrim 400-80 MG tablet Generic drug: sulfamethoxazole-trimethoprim Take 1 tablet by mouth daily.    Cholecalciferol 50 MCG (2000 UT) Tabs Take 2,000 Units by mouth daily.    Cranberry 450 MG Tabs Take 450 mg by mouth daily.    dorzolamide-timolol 2-0.5 % ophthalmic solution Commonly known as: COSOPT Place 1 drop into both eyes 2 (two) times daily.    finasteride 5 MG tablet Commonly known as: PROSCAR Take 1 tablet (5 mg total) by mouth daily.    latanoprost 0.005 % ophthalmic solution Commonly known as: XALATAN Place 1 drop into both eyes at bedtime. Dr Atilano Median    melatonin 3 MG Tabs tablet Take 3 mg by mouth at bedtime as needed.    mupirocin ointment 2 % Commonly known as: BACTROBAN Apply 1 Application topically 2 (two) times daily.    phenazopyridine 95 MG tablet Commonly known as: PYRIDIUM Take 95 mg by mouth 3 (three) times daily as needed (urinary spasms).    psyllium 0.52 g capsule Commonly known as: REGULOID Take 0.52 g by mouth at bedtime.    risperiDONE 0.5 MG tablet Commonly known as: RISPERDAL Take 0.5 mg by mouth at bedtime.    sennosides-docusate sodium 8.6-50 MG tablet Commonly known as: SENOKOT-S Take one tablet by mouth at bedtime on Mondays, Wednesdays, and Fridays.    tamsulosin 0.4 MG Caps capsule Commonly known as: FLOMAX Take 0.4 mg by mouth daily.    warfarin 7.5 MG tablet Commonly known as: COUMADIN Take 7.5 mg by mouth daily. For 5 days.       Relevant Imaging Results:  Relevant Lab Results:   Additional Information SS [id number]  Gerilyn Pilgrim, LCSW

## 2022-06-14 NOTE — Progress Notes (Signed)
[place] and gave report to [place] caring for patient [phone].

## 2022-06-14 NOTE — Progress Notes (Signed)
Called [facility] to give report at [phone] they said they would have to call me back

## 2022-06-14 NOTE — Care Management Important Message (Signed)
Important Message  Patient Details  Name: Erik Larson MRN: [id number] Date of Birth: December 04, 1924   Medicare Important Message Given:  Yes     Dannette Barbara 06/14/2022, 2:46 PM

## 2022-06-14 NOTE — TOC Transition Note (Signed)
Transition of Care [facility]) - CM/SW Discharge Note   Patient Details  Name: Erik Larson MRN: [id number] Date of Birth: 1924/05/31  [facility] CM/SW Contact:  Gerilyn Pilgrim, LCSW Phone Number: 06/14/2022, 4:02 PM   Clinical Narrative:   Pt discharging back to [facility]. CSW spoke with Dottie. DC summary and fl2 sent to facility RN given number for report. [facility] to be called, medical necessity printed to unit.           Patient Goals and CMS Choice      Discharge Placement                         Discharge Plan and Services Additional resources added to the After Visit Summary for                                       Social Determinants of Health (SDOH) Interventions SDOH Screenings   Food Insecurity: No Food Insecurity (06/12/2022)  Housing: Low Risk  (06/12/2022)  Transportation Needs: No Transportation Needs (06/12/2022)  Utilities: Not At Risk (06/12/2022)  Financial Resource Strain: Low Risk  (07/12/2017)  Physical Activity: Inactive (07/12/2017)  Social Connections: Unknown (07/12/2017)  Stress: No Stress Concern Present (07/12/2017)  Tobacco Use: Medium Risk (06/13/2022)     Readmission Risk Interventions     No data to display

## 2022-06-16 LAB — CULTURE, BLOOD (ROUTINE X 2)
Culture: NO GROWTH
Special Requests: ADEQUATE

## 2022-06-17 LAB — CULTURE, BLOOD (ROUTINE X 2)
Culture: NO GROWTH
Special Requests: ADEQUATE

## 2022-06-29 NOTE — Progress Notes (Signed)
Patient for IR Cath Tube Exchange on Thurs 06/30/2022, I called and spoke with the patient's daughter, [name] on the phone and gave pre-procedure instructions. [name] was made aware to be here at 10a. [name] stated understanding.  Called 06/29/2022

## 2022-06-30 ENCOUNTER — Ambulatory Visit
Admission: RE | Admit: 2022-06-30 | Discharge: 2022-06-30 | Disposition: A | Discharge status: Home / Self Care | Payer: Medicare Other | Source: Ambulatory Visit | Attending: Interventional Radiology | Admitting: Interventional Radiology

## 2022-06-30 DIAGNOSIS — R339 Retention of urine, unspecified: Secondary | ICD-10-CM

## 2022-06-30 HISTORY — PX: IR CATHETER TUBE CHANGE: IMG717

## 2022-06-30 MED ORDER — LIDOCAINE HCL 1 % IJ SOLN
INTRAMUSCULAR | Status: AC
  Filled 2022-06-30: qty 20

## 2022-06-30 MED ORDER — LIDOCAINE HCL 1 % IJ SOLN
10.0000 mL | Freq: Once | INTRAMUSCULAR | Status: AC
  Administered 2022-06-30: 10 mL via INTRADERMAL

## 2022-06-30 MED ORDER — LIDOCAINE VISCOUS HCL 2 % MT SOLN
OROMUCOSAL | Status: AC
  Filled 2022-06-30: qty 15

## 2022-06-30 MED ORDER — STERILE WATER FOR INJECTION IJ SOLN
INTRAMUSCULAR | Status: AC
  Filled 2022-06-30: qty 10

## 2022-06-30 MED ORDER — IOHEXOL 300 MG/ML  SOLN
8.0000 mL | Freq: Once | INTRAMUSCULAR | Status: AC | PRN
  Administered 2022-06-30: 8 mL

## 2022-07-14 IMAGING — MR MR HEAD W/O CM
12 series · 44 of 48 positions shown · non-contrast
Comparison: Prior head CT from 08/14/2021.

CLINICAL DATA: Initial evaluation for mental status change, unknown
cause.

EXAM:
MRI HEAD WITHOUT CONTRAST
TECHNIQUE: Multiplanar, multiecho pulse sequences of the brain and surrounding
structures were obtained without intravenous contrast.

[Series 5: ax dwi_tracew · axial · 3.0mm · 0.65mm/px · z∈[-79,+62]mm · 3 of 44 slices shown]
[im 1/44]
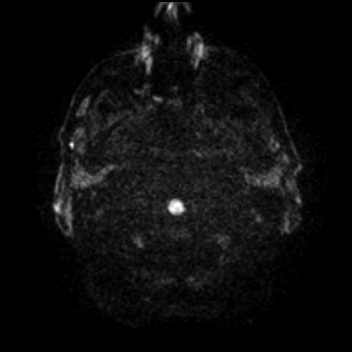
[im 22/44]
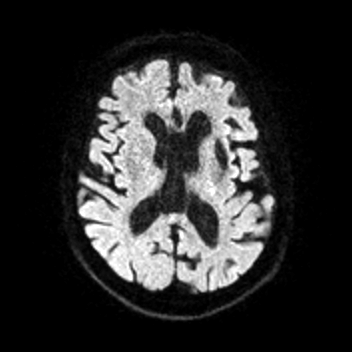
[im 44/44]
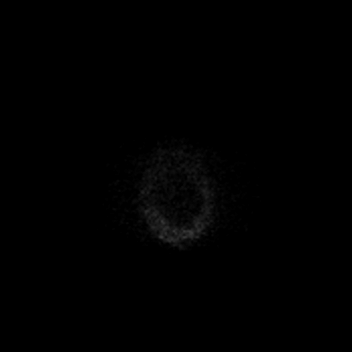

[Series 6: ax dwi_adc · axial · 3.0mm · 0.65mm/px · z∈[-79,+62]mm · 3 of 44 slices shown]
[im 1/44]
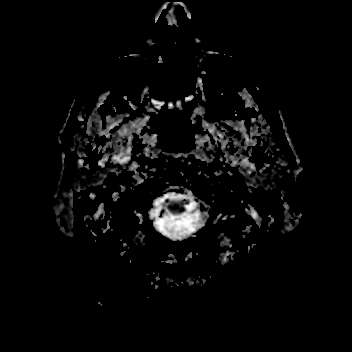
[im 22/44]
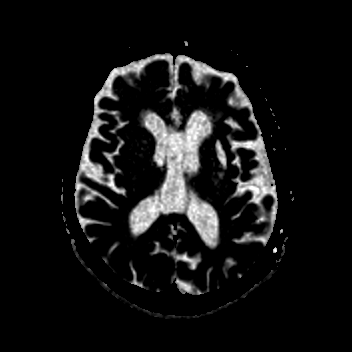
[im 44/44]
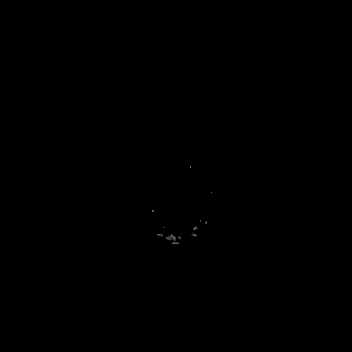

[Series 7: cor dwi_tracew · coronal · 5.0mm · 0.65mm/px · 3 of 34 slices shown]
[im 1/34]
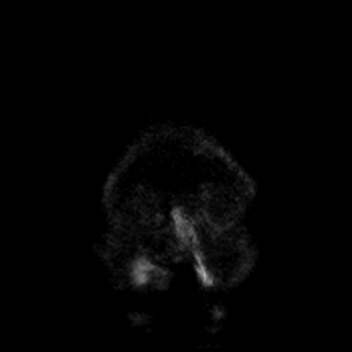
[im 17/34]
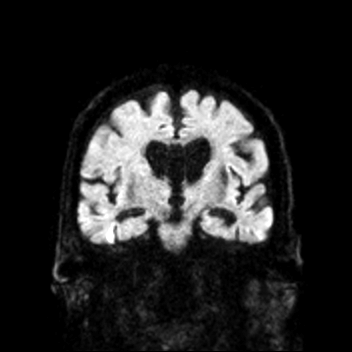
[im 34/34]
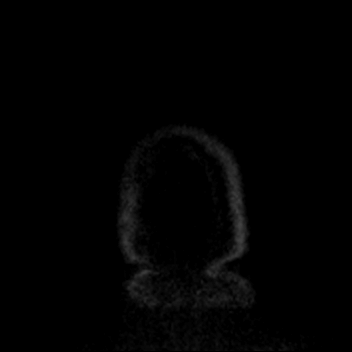

[Series 8: cor dwi_adc · coronal · 5.0mm · 0.65mm/px · 3 of 34 slices shown]
[im 1/34]
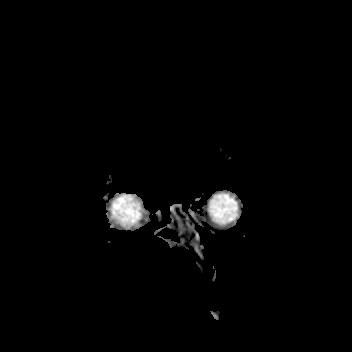
[im 17/34]
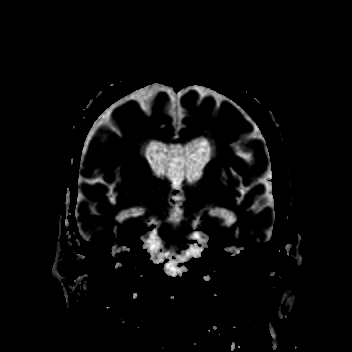
[im 34/34]
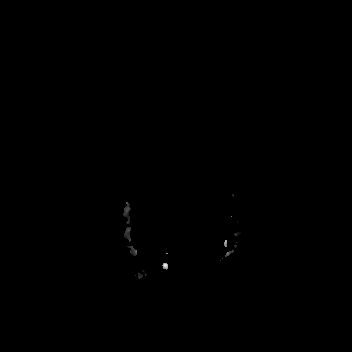

[Series 9: T1 · sagittal · 5.0mm · 0.62mm/px · 2 of 22 slices shown (1 of 2)]
[im 1/22]
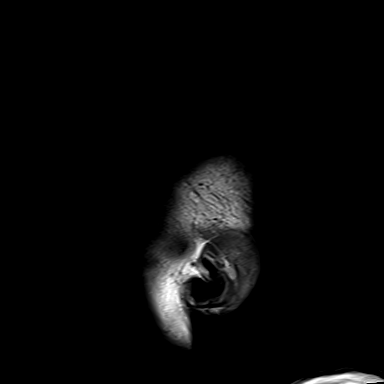
[im 22/22]
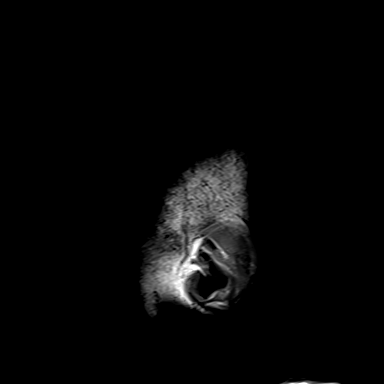

[Series 10: T2 · axial · 5.0mm · 0.53mm/px · z∈[-84,+65]mm · 2 of 26 slices shown (1 of 2)]
[im 1/26]
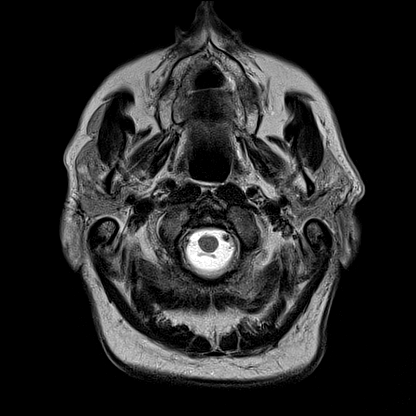
[im 26/26]
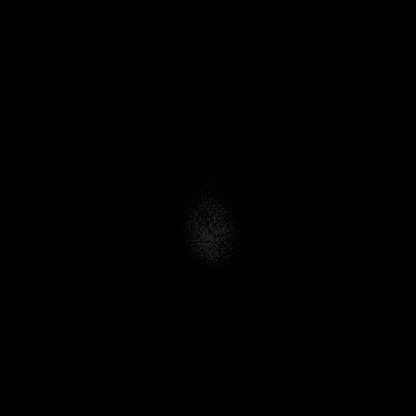

[Series 11: mag_images · axial · 3.0mm · 0.90mm/px · z∈[-97,+79]mm · 5 of 60 slices shown]
[im 1/60]
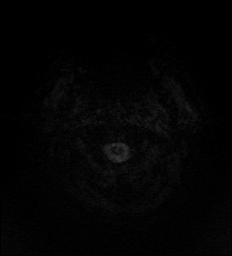
[im 15/60]
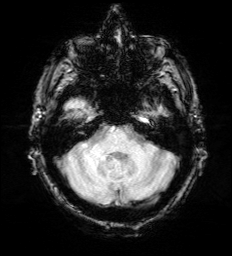
[im 30/60]
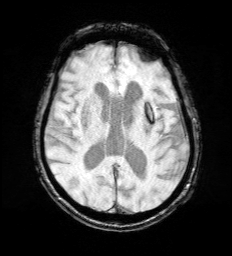
[im 45/60]
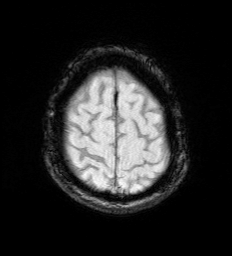
[im 60/60]
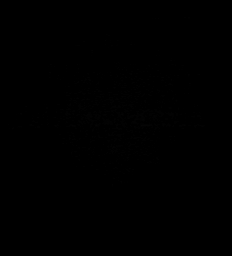

[Series 12: pha_images · axial · 3.0mm · 0.90mm/px · z∈[-97,+79]mm · 5 of 57 slices shown]
[im 1/57]
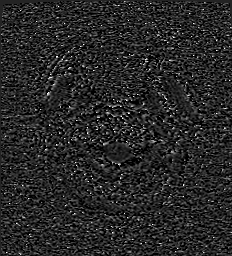
[im 15/57]
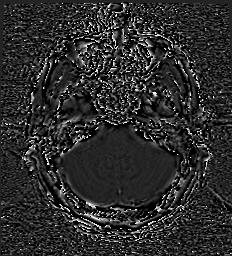
[im 29/57]
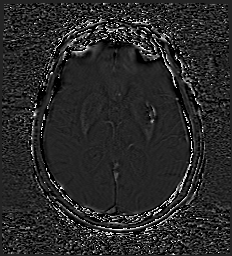
[im 43/57]
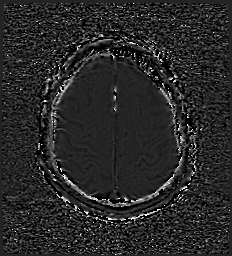
[im 57/57]
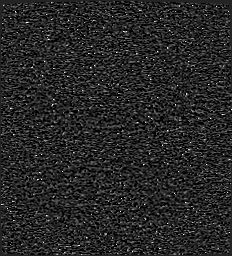

[Series 13: swi_images · axial · 3.0mm · 0.90mm/px · z∈[-97,+34]mm · 4 of 60 slices shown]
[im 1/60]
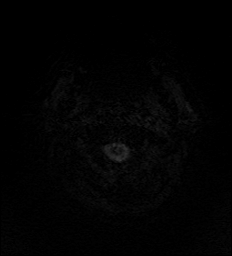
[im 15/60]
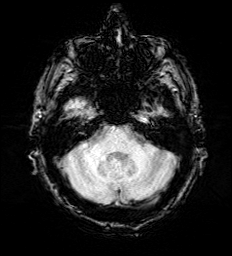
[im 30/60]
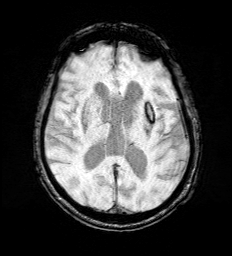
[im 45/60]
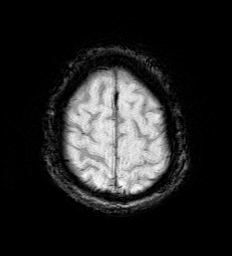

[Series 15: FLAIR · axial · 3.0mm · 0.53mm/px · z∈[-82,+64]mm · 4 of 50 slices shown]
[im 1/50]
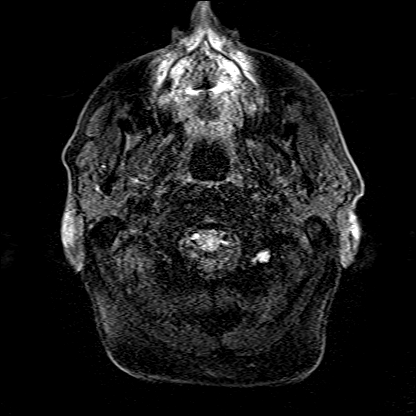
[im 17/50]
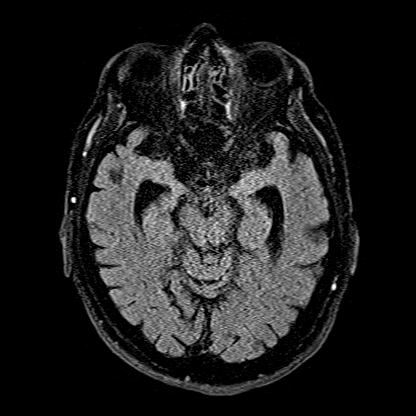
[im 33/50]
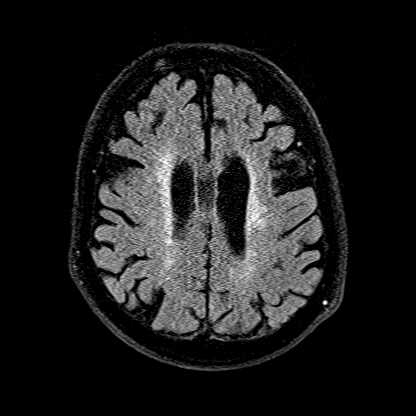
[im 50/50]
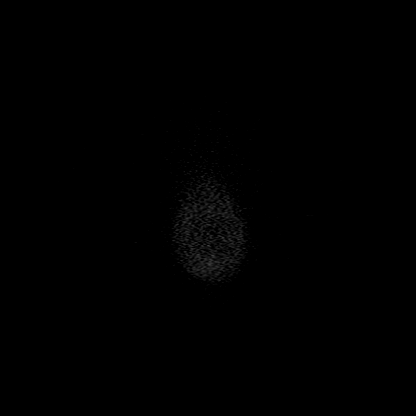

[Series 16: T1 · axial · 1.0mm · 0.98mm/px · z∈[-79,+63]mm · 8 of 144 slices shown (2 of 2)]
[im 1/144]
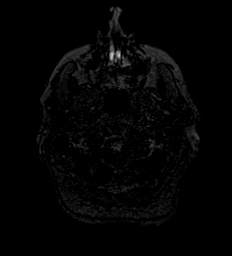
[im 29/144]
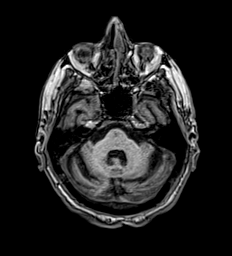
[im 43/144]
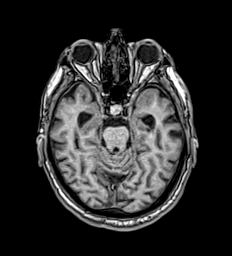
[im 58/144]
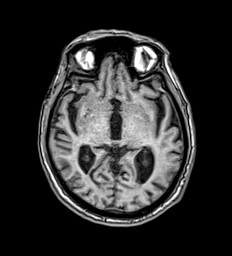
[im 86/144]
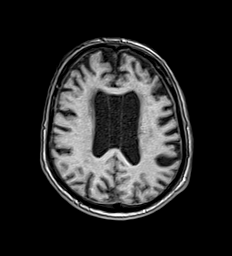
[im 101/144]
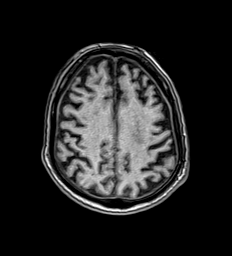
[im 115/144]
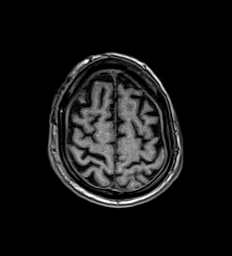
[im 144/144]
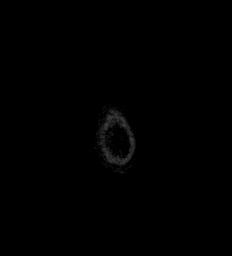

[Series 17: T2 · coronal · 5.0mm · 0.45mm/px · 2 of 28 slices shown (2 of 2)]
[im 1/28]
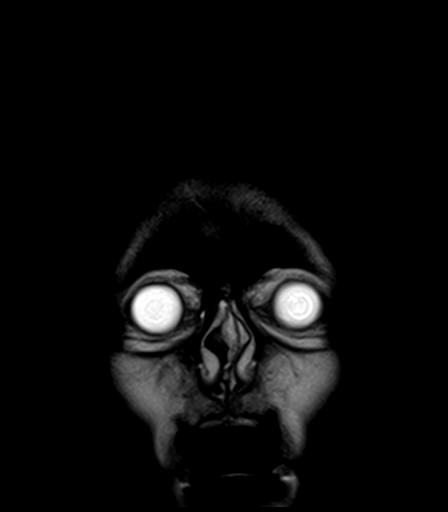
[im 28/28]
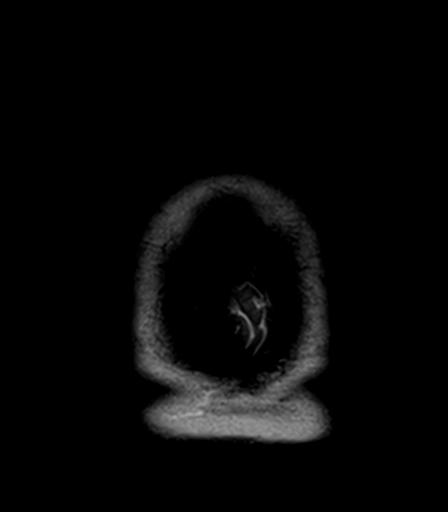

[44 of 48 positions shown; findings below may reference images not displayed]

FINDINGS: Brain: Diffuse prominence of the CSF containing spaces compatible
generalized cerebral atrophy. Patchy T2/FLAIR hyperintensity
involving the periventricular and deep white matter both cerebral
hemispheres as well as the pons, consistent with chronic small
vessel ischemic disease, mild for age. Remote hemorrhagic lacunar
infarct seen involving the left basal ganglia/subinsular white
matter.

No abnormal foci of restricted diffusion to suggest acute or
subacute ischemia. Gray-white matter differentiation otherwise
maintained. No other areas of remote cortical infarction. No acute
intracranial hemorrhage. Additional single punctate chronic
microhemorrhage noted within the right thalamus, likely hypertensive
in nature.

No mass lesion, midline shift or mass effect. Mild ventricular
prominence related to global parenchymal volume loss of
hydrocephalus. Cavum et septum pellucidum noted. No extra-axial
fluid collection. Pituitary gland suprasellar region within normal
limits. Midline structures intact and normally formed.

Vascular: Major intracranial vascular flow voids are maintained.

Skull and upper cervical spine: Craniocervical junction within
normal limits. Bone marrow signal intensity normal. No scalp soft
tissue abnormality.

Sinuses/Orbits: Prior bilateral ocular lens replacement. Mild
chronic mucosal thickening present about the ethmoidal air cells and
maxillary sinuses. Small to moderate right mastoid effusion noted.

Other: None.
IMPRESSION: 1. No acute intracranial abnormality.
2. Remote hemorrhagic lacunar infarct involving the left basal
ganglia/subinsular white matter.
3. Generalized age-related cerebral atrophy with mild chronic small
vessel ischemic disease.
4. Small to moderate right mastoid effusion, of uncertain
significance. Correlation with physical exam suggested.

## 2022-08-03 ENCOUNTER — Inpatient Hospital Stay (HOSPITAL_COMMUNITY)
Admit: 2022-08-03 | Discharge: 2022-08-03 | Disposition: A | Discharge status: Home / Self Care | Payer: Medicare Other | Attending: Family Medicine | Admitting: Family Medicine

## 2022-08-03 ENCOUNTER — Other Ambulatory Visit: Payer: Self-pay

## 2022-08-03 ENCOUNTER — Emergency Department: Payer: Medicare Other

## 2022-08-03 ENCOUNTER — Inpatient Hospital Stay
Admission: EM | Admit: 2022-08-03 | Discharge: 2022-08-10 | DRG: 698 | Disposition: A | Discharge status: Home Health | Payer: Medicare Other | Attending: Student | Admitting: Student

## 2022-08-03 DIAGNOSIS — T83518A Infection and inflammatory reaction due to other urinary catheter, initial encounter: Secondary | ICD-10-CM | POA: Diagnosis present

## 2022-08-03 DIAGNOSIS — Z9841 Cataract extraction status, right eye: Secondary | ICD-10-CM

## 2022-08-03 DIAGNOSIS — H409 Unspecified glaucoma: Secondary | ICD-10-CM | POA: Diagnosis present

## 2022-08-03 DIAGNOSIS — E559 Vitamin D deficiency, unspecified: Secondary | ICD-10-CM | POA: Diagnosis present

## 2022-08-03 DIAGNOSIS — I482 Chronic atrial fibrillation, unspecified: Secondary | ICD-10-CM | POA: Diagnosis not present

## 2022-08-03 DIAGNOSIS — R531 Weakness: Secondary | ICD-10-CM | POA: Diagnosis present

## 2022-08-03 DIAGNOSIS — R29898 Other symptoms and signs involving the musculoskeletal system: Secondary | ICD-10-CM

## 2022-08-03 DIAGNOSIS — E538 Deficiency of other specified B group vitamins: Secondary | ICD-10-CM | POA: Diagnosis present

## 2022-08-03 DIAGNOSIS — Z7189 Other specified counseling: Secondary | ICD-10-CM | POA: Diagnosis not present

## 2022-08-03 DIAGNOSIS — Y846 Urinary catheterization as the cause of abnormal reaction of the patient, or of later complication, without mention of misadventure at the time of the procedure: Secondary | ICD-10-CM | POA: Diagnosis present

## 2022-08-03 DIAGNOSIS — G9341 Metabolic encephalopathy: Secondary | ICD-10-CM | POA: Diagnosis present

## 2022-08-03 DIAGNOSIS — R29810 Facial weakness: Secondary | ICD-10-CM | POA: Diagnosis present

## 2022-08-03 DIAGNOSIS — Z880 Allergy status to penicillin: Secondary | ICD-10-CM

## 2022-08-03 DIAGNOSIS — R791 Abnormal coagulation profile: Secondary | ICD-10-CM | POA: Diagnosis present

## 2022-08-03 DIAGNOSIS — R4701 Aphasia: Secondary | ICD-10-CM | POA: Diagnosis not present

## 2022-08-03 DIAGNOSIS — H547 Unspecified visual loss: Secondary | ICD-10-CM | POA: Diagnosis present

## 2022-08-03 DIAGNOSIS — I4819 Other persistent atrial fibrillation: Secondary | ICD-10-CM | POA: Diagnosis present

## 2022-08-03 DIAGNOSIS — N390 Urinary tract infection, site not specified: Secondary | ICD-10-CM | POA: Diagnosis present

## 2022-08-03 DIAGNOSIS — M109 Gout, unspecified: Secondary | ICD-10-CM | POA: Diagnosis present

## 2022-08-03 DIAGNOSIS — N401 Enlarged prostate with lower urinary tract symptoms: Secondary | ICD-10-CM | POA: Diagnosis present

## 2022-08-03 DIAGNOSIS — E785 Hyperlipidemia, unspecified: Secondary | ICD-10-CM | POA: Diagnosis present

## 2022-08-03 DIAGNOSIS — Z8249 Family history of ischemic heart disease and other diseases of the circulatory system: Secondary | ICD-10-CM

## 2022-08-03 DIAGNOSIS — Z79899 Other long term (current) drug therapy: Secondary | ICD-10-CM

## 2022-08-03 DIAGNOSIS — Z9842 Cataract extraction status, left eye: Secondary | ICD-10-CM

## 2022-08-03 DIAGNOSIS — I639 Cerebral infarction, unspecified: Secondary | ICD-10-CM | POA: Diagnosis present

## 2022-08-03 DIAGNOSIS — R4182 Altered mental status, unspecified: Secondary | ICD-10-CM | POA: Diagnosis not present

## 2022-08-03 DIAGNOSIS — Z9359 Other cystostomy status: Secondary | ICD-10-CM | POA: Diagnosis not present

## 2022-08-03 DIAGNOSIS — H109 Unspecified conjunctivitis: Secondary | ICD-10-CM | POA: Diagnosis present

## 2022-08-03 DIAGNOSIS — Z515 Encounter for palliative care: Secondary | ICD-10-CM

## 2022-08-03 DIAGNOSIS — F0390 Unspecified dementia without behavioral disturbance: Secondary | ICD-10-CM | POA: Diagnosis present

## 2022-08-03 DIAGNOSIS — I10 Essential (primary) hypertension: Secondary | ICD-10-CM | POA: Diagnosis not present

## 2022-08-03 DIAGNOSIS — B965 Pseudomonas (aeruginosa) (mallei) (pseudomallei) as the cause of diseases classified elsewhere: Secondary | ICD-10-CM | POA: Diagnosis present

## 2022-08-03 DIAGNOSIS — Z9889 Other specified postprocedural states: Secondary | ICD-10-CM

## 2022-08-03 DIAGNOSIS — K219 Gastro-esophageal reflux disease without esophagitis: Secondary | ICD-10-CM | POA: Diagnosis present

## 2022-08-03 DIAGNOSIS — Z87891 Personal history of nicotine dependence: Secondary | ICD-10-CM

## 2022-08-03 DIAGNOSIS — G9340 Encephalopathy, unspecified: Secondary | ICD-10-CM | POA: Diagnosis not present

## 2022-08-03 DIAGNOSIS — Z66 Do not resuscitate: Secondary | ICD-10-CM | POA: Diagnosis present

## 2022-08-03 DIAGNOSIS — Z7901 Long term (current) use of anticoagulants: Secondary | ICD-10-CM | POA: Diagnosis not present

## 2022-08-03 DIAGNOSIS — I4891 Unspecified atrial fibrillation: Secondary | ICD-10-CM | POA: Diagnosis present

## 2022-08-03 DIAGNOSIS — Z888 Allergy status to other drugs, medicaments and biological substances status: Secondary | ICD-10-CM

## 2022-08-03 DIAGNOSIS — K5900 Constipation, unspecified: Secondary | ICD-10-CM | POA: Diagnosis not present

## 2022-08-03 LAB — GLUCOSE, CAPILLARY: Glucose-Capillary: 97 mg/dL (ref 70–99)

## 2022-08-03 LAB — COMPREHENSIVE METABOLIC PANEL
ALT: 15 U/L (ref 0–44)
AST: 22 U/L (ref 15–41)
Albumin: 3.9 g/dL (ref 3.5–5.0)
Alkaline Phosphatase: 86 U/L (ref 38–126)
Anion gap: 8 (ref 5–15)
BUN: 18 mg/dL (ref 8–23)
CO2: 25 mmol/L (ref 22–32)
Calcium: 9 mg/dL (ref 8.9–10.3)
Chloride: 103 mmol/L (ref 98–111)
Creatinine, Ser: 1.05 mg/dL (ref 0.61–1.24)
GFR, Estimated: >60 mL/min (ref >60)
Glucose, Bld: 99 mg/dL (ref 70–99)
Potassium: 4.4 mmol/L (ref 3.5–5.1)
Sodium: 136 mmol/L (ref 135–145)
Total Bilirubin: 0.9 mg/dL (ref 0.3–1.2)
Total Protein: 7.7 g/dL (ref 6.5–8.1)

## 2022-08-03 LAB — DIFFERENTIAL
Abs Immature Granulocytes: 0.02 10*3/uL (ref 0.00–0.07)
Basophils Absolute: 0.1 10*3/uL (ref 0.0–0.1)
Basophils Relative: 1 %
Eosinophils Absolute: 0.3 10*3/uL (ref 0.0–0.5)
Eosinophils Relative: 5 %
Immature Granulocytes: 0 %
Lymphocytes Relative: 25 %
Lymphs Abs: 1.5 10*3/uL (ref 0.7–4.0)
Monocytes Absolute: 0.6 10*3/uL (ref 0.1–1.0)
Monocytes Relative: 10 %
Neutro Abs: 3.6 10*3/uL (ref 1.7–7.7)
Neutrophils Relative %: 59 %

## 2022-08-03 LAB — URINALYSIS, W/ REFLEX TO CULTURE (INFECTION SUSPECTED)
Bilirubin Urine: NEGATIVE
Glucose, UA: NEGATIVE mg/dL
Hgb urine dipstick: NEGATIVE
Ketones, ur: NEGATIVE mg/dL
Nitrite: NEGATIVE
Protein, ur: NEGATIVE mg/dL
Specific Gravity, Urine: 1.023 (ref 1.005–1.030)
pH: 7 (ref 5.0–8.0)

## 2022-08-03 LAB — CBC
HCT: 35.6 % — ABNORMAL LOW (ref 39.0–52.0)
Hemoglobin: 12 g/dL — ABNORMAL LOW (ref 13.0–17.0)
MCH: 32.3 pg (ref 26.0–34.0)
MCHC: 33.7 g/dL (ref 30.0–36.0)
MCV: 96 fL (ref 80.0–100.0)
Platelets: 234 10*3/uL (ref 150–400)
RBC: 3.71 MIL/uL — ABNORMAL LOW (ref 4.22–5.81)
RDW: 13.3 % (ref 11.5–15.5)
WBC: 6 10*3/uL (ref 4.0–10.5)
nRBC: 0 % (ref 0.0–0.2)

## 2022-08-03 LAB — PROTIME-INR
INR: 2 — ABNORMAL HIGH (ref 0.8–1.2)
Prothrombin Time: 22.5 seconds — ABNORMAL HIGH (ref 11.4–15.2)

## 2022-08-03 LAB — APTT: aPTT: 37 seconds — ABNORMAL HIGH (ref 24–36)

## 2022-08-03 LAB — AMMONIA: Ammonia: <10 umol/L (ref 9–35)

## 2022-08-03 LAB — ETHANOL: Alcohol, Ethyl (B): <10 mg/dL (ref <10)

## 2022-08-03 MED ORDER — SODIUM CHLORIDE 0.9% FLUSH
3.0000 mL | Freq: Once | INTRAVENOUS | Status: AC
  Administered 2022-08-03: 3 mL via INTRAVENOUS

## 2022-08-03 MED ORDER — STROKE: EARLY STAGES OF RECOVERY BOOK: Freq: Once | Status: DC

## 2022-08-03 MED ORDER — METOPROLOL TARTRATE 5 MG/5ML IV SOLN
5.0000 mg | Freq: Four times a day (QID) | INTRAVENOUS | Status: DC | PRN
  Filled 2022-08-03: qty 5

## 2022-08-03 MED ORDER — LORAZEPAM 2 MG/ML IJ SOLN
1.0000 mg | Freq: Once | INTRAMUSCULAR | Status: AC
  Administered 2022-08-03: 1 mg via INTRAVENOUS
  Filled 2022-08-03: qty 1

## 2022-08-03 MED ORDER — ALLOPURINOL 100 MG PO TABS
100.0000 mg | ORAL_TABLET | Freq: Every day | ORAL | Status: DC
  Administered 2022-08-05 – 2022-08-10 (×6): 100 mg via ORAL
  Filled 2022-08-03 (×7): qty 1

## 2022-08-03 MED ORDER — RISPERIDONE 0.5 MG PO TABS
0.5000 mg | ORAL_TABLET | Freq: Every day | ORAL | Status: DC
  Administered 2022-08-04 – 2022-08-09 (×6): 0.5 mg via ORAL
  Filled 2022-08-03 (×6): qty 1

## 2022-08-03 MED ORDER — FINASTERIDE 5 MG PO TABS
5.0000 mg | ORAL_TABLET | Freq: Every day | ORAL | Status: DC
  Administered 2022-08-05 – 2022-08-10 (×6): 5 mg via ORAL
  Filled 2022-08-03 (×7): qty 1

## 2022-08-03 MED ORDER — WARFARIN - PHARMACIST DOSING INPATIENT
Freq: Every day | Status: DC
  Filled 2022-08-03: qty 1

## 2022-08-03 MED ORDER — WARFARIN SODIUM 5 MG PO TABS
5.0000 mg | ORAL_TABLET | Freq: Once | ORAL | Status: DC
  Filled 2022-08-03: qty 1

## 2022-08-03 MED ORDER — IOHEXOL 350 MG/ML SOLN
50.0000 mL | Freq: Once | INTRAVENOUS | Status: AC | PRN
  Administered 2022-08-03: 50 mL via INTRAVENOUS

## 2022-08-03 MED ORDER — LABETALOL HCL 5 MG/ML IV SOLN
10.0000 mg | INTRAVENOUS | Status: DC | PRN
  Filled 2022-08-03: qty 4

## 2022-08-03 NOTE — ED Notes (Signed)
Daughter at bedside with patient

## 2022-08-03 NOTE — Assessment & Plan Note (Signed)
Rate controlled on presentation Coumadin per pharmacy Consider low dose amio vs. Metoprolol for RVR overnight so as to avoid significant hypotension  Follow

## 2022-08-03 NOTE — Progress Notes (Signed)
   08/03/22 1300  Spiritual Encounters  Type of Visit Attempt (pt unavailable)  OnCall Visit Yes  Spiritual Care Plan  Spiritual Care Issues Still Outstanding Chaplain will continue to follow   Patient came in with code stroke was taking right to CT. There was no family with the patient. Patient came from rehab center chaplain follow up on patient.

## 2022-08-03 NOTE — ED Triage Notes (Signed)
Pt presents to the ED via [facility]. Code stroke called in route. Upon arrival pt was met at the EMS door by this RN, Tresa Endo, RN (stroke coordinator), neurologist, and Marylene Land, Furniture conservator/restorer). Pt severe aphasia with last known well of 1000 today. Pt is from [facility] and per staff typically is ambulatory and is able to perform ADL's by self.

## 2022-08-03 NOTE — Consult Note (Addendum)
ANTICOAGULATION CONSULT NOTE - Initial Consult  Pharmacy Consult for Warfarin Indication: atrial fibrillation  Allergies  Allergen Reactions   Ace Inhibitors    Penicillins Rash    Patient Measurements: Weight: 84.8 kg (187 lb)  Vital Signs: Temp: 98.8 F (37.1 C) (05/22 1441) Temp Source: Axillary (05/22 1441) BP: 170/113 (05/22 1334) Pulse Rate: 75 (05/22 1334)  Labs: Recent Labs    08/03/22 1307  HGB 12.0*  HCT 35.6*  PLT 234  APTT 37*  LABPROT 22.5*  INR 2.0*  CREATININE 1.05    Estimated Creatinine Clearance: 43.8 mL/min (by C-G formula based on SCr of 1.05 mg/dL).   Medical History: Past Medical History:  Diagnosis Date   Arthritis    Atrial fibrillation (HCC)    controlled on coumadin   GERD (gastroesophageal reflux disease)    Glaucoma    Gout    Heart disease    Hyperlipidemia    Warfarin therapy started 07/29/2014    Medications:  PTA Warfarin: 35 mg/wk 5 mg daily  Assessment: Erik Larson is a 89 y.o. male presenting with concern for CVA. PMH significant for arthritis, AF, GERD, HLD, gout. Patient was on Warfarin PTA per chart review. Last dose of Warfarin PTA was on 08/02/2022 @ 1610 (5 mg). Of note, INR was 2.0 on home regimen on arrival. Per neurology, ok to resume home warfarin. Pharmacy has been consulted to initiate and manage warfarin.    Baseline Labs: aPTT 37, PT 22.5, INR 2.0, Hgb 12.0, Hct 35.6, Plt 236   Goal of Therapy:  INR 2-3 Monitor platelets by anticoagulation protocol: Yes   Date    INR      Warfarin Dose    5/22 2.0 5 mg (PTA)  Plan:  Give warfarin 5 mg x1 dose today  Check INR daily until stable Check CBC at least weekly while on warfarin   Celene Squibb, PharmD PGY1 Pharmacy Resident 08/03/2022 4:05 PM

## 2022-08-03 NOTE — ED Provider Notes (Signed)
[facility] Provider Note    Event Date/Time   First MD Initiated Contact with Patient 08/03/22 1309     (approximate)   History   Code Stroke   HPI  Erik Larson is a 89 y.o. male   Past medical history of dementia, atrial fibrillation on Eliquis, hypertension, here for altered mental status/confusion/aphasia.  Last known normal was around 10 AM.  Around 47 AM daughter noticed that he was very confused and had difficulty speaking and called for EMS transport.  Came to the emergency department as a code stroke.  Neurology at bedside for evaluation and accompanies him to CT scan.  INR is elevated and so TNK not given.  Noted by neurology to move all extremities spontaneously but questionable mild right leg drift not following commands and seems confused, disoriented.  Perhaps some right-sided neglect as well per their note.  The remainder of history is difficult to obtain given his altered mental status.     Physical Exam   Triage Vital Signs: ED Triage Vitals  Enc Vitals Group     BP 08/03/22 1314 (!) 176/102     Pulse Rate 08/03/22 1334 75     Resp 08/03/22 1334 18     Temp --      Temp src --      SpO2 08/03/22 1334 99 %     Weight 08/03/22 1335 187 lb (84.8 kg)     Height --      Head Circumference --      Peak Flow --      Pain Score --      Pain Loc --      Pain Edu? --      Excl. in GC? --     Most recent vital signs: Vitals:   08/03/22 1314 08/03/22 1334  BP: (!) 176/102 (!) 170/113  Pulse:  75  Resp:  18  SpO2:  99%    General: Awake, no distress.  CV:  Good peripheral perfusion.  Resp:  Normal effort.  Abd:  No distention. Other:  He is altered disoriented and only intermittently able to answer my questions.  When I ask him if he is in pain he shakes his head no.  He has a soft nontender abdomen.  He has a suprapubic catheter in place which his daughter states was placed months ago.  There is no overlying cellulitic changes  to the insertion site and there is no blood or purulence in the urine bag which is draining.   ED Results / Procedures / Treatments   Labs (all labs ordered are listed, but only abnormal results are displayed) Labs Reviewed  PROTIME-INR - Abnormal; Notable for the following components:      Result Value   Prothrombin Time 22.5 (*)    INR 2.0 (*)    All other components within normal limits  APTT - Abnormal; Notable for the following components:   aPTT 37 (*)    All other components within normal limits  CBC - Abnormal; Notable for the following components:   RBC 3.71 (*)    Hemoglobin 12.0 (*)    HCT 35.6 (*)    All other components within normal limits  DIFFERENTIAL  COMPREHENSIVE METABOLIC PANEL  GLUCOSE, CAPILLARY  ETHANOL  URINALYSIS, W/ REFLEX TO CULTURE (INFECTION SUSPECTED)  CBG MONITORING, ED     I ordered and reviewed the above labs they are notable for an elevated 2.0, normal white blood cell  count.  EKG  ED ECG REPORT I, Pilar Jarvis, the attending physician, personally viewed and interpreted this ECG.   Date: 08/03/2022  EKG Time: 1329  Rate: 89  Rhythm: AF  Axis: nl  Intervals:none  ST&T Change: no stemi    RADIOLOGY I independently reviewed and interpreted CT scan of the head see no obvious bleeding or midline shift   PROCEDURES:  Critical Care performed: Yes, see critical care procedure note(s)  .Critical Care  Performed by: Pilar Jarvis, MD Authorized by: Pilar Jarvis, MD   Critical care provider statement:    Critical care time (minutes):  30   Critical care was time spent personally by me on the following activities:  Development of treatment plan with patient or surrogate, discussions with consultants, evaluation of patient's response to treatment, examination of patient, ordering and review of laboratory studies, ordering and review of radiographic studies, ordering and performing treatments and interventions, pulse oximetry, re-evaluation  of patient's condition and review of old charts    MEDICATIONS ORDERED IN ED: Medications  LORazepam (ATIVAN) injection 1 mg (has no administration in time range)  sodium chloride flush (NS) 0.9 % injection 3 mL (3 mLs Intravenous Given 08/03/22 1336)  iohexol (OMNIPAQUE) 350 MG/ML injection 50 mL (50 mLs Intravenous Contrast Given 08/03/22 1322)    External physician / consultants:  I spoke with Dr. Amada Jupiter of neurology regarding care plan for this patient.   IMPRESSION / MDM / ASSESSMENT AND PLAN / ED COURSE  I reviewed the triage vital signs and the nursing notes.                                Patient's presentation is most consistent with acute presentation with potential threat to life or bodily function.  Differential diagnosis includes, but is not limited to, CVA, intracranial bleeding, seizure, infection, metabolic derangement, metabolic encephalopathy   The patient is on the cardiac monitor to evaluate for evidence of arrhythmia and/or significant heart rate changes.  MDM: Patient code stroke, neurology consulted and will be worked up for stroke.  Not a TNK candidate due to INR.  Check basic labs, electrolytes, infectious workup including urinalysis and chest x-ray, admission for altered mental status  Chronic indwelling suprapubic catheter with no fever or white blood cell count elevation, risk factor for infection, though would be unusual to have such an acute change in mentation from infection.  Soft benign abdominal exam doubt infection there to account for his altered mental status.  On not sure what is causing his altered mental status, stroke workup is ongoing MRI pending neurology consulted.  Plan is for admission.       FINAL CLINICAL IMPRESSION(S) / ED DIAGNOSES   Final diagnoses:  Altered mental status, unspecified altered mental status type  Left leg weakness     Rx / DC Orders   ED Discharge Orders     None        Note:  This document was  prepared using Dragon voice recognition software and may include unintentional dictation errors.    Pilar Jarvis, MD 08/03/22 1435

## 2022-08-03 NOTE — Assessment & Plan Note (Signed)
PPI ?

## 2022-08-03 NOTE — Code Documentation (Signed)
Stroke Response Nurse Documentation Code Documentation  Erik Larson is a 89 y.o. male arriving to [facility] via [facility] on 08/03/2022 with past medical hx of a-fib, GERD, HLD, glaucoma, gout, heart disease. On warfarin daily. Code stroke was activated by EMS.   Patient from [facility] where he was LKW at 1000 and now complaining of AMS, aphasia, Per EMS, staff saw patient at 1000 this morning when going to the bathroom. Daughter noticed patient was confused at 1100. EMS was called.    Stroke team at the bedside on patient arrival. Labs drawn and patient cleared for CT by Dr. Modesto Charon. Patient to CT with team. NIHSS 10, see documentation for details and code stroke times. Patient with disoriented, not following commands, right hemianopia, left facial droop, right leg weakness, and Global aphasia  on exam. The following imaging was completed:  CT Head and CTA. Patient is not a candidate for IV Thrombolytic due to elevated INR, per MD . Patient is not a candidate for IR due to no LVO seen on imaging per MD.   Care Plan: q2h NIHSS and vital signs, swallow screen per order.   Bedside handoff with ED RN Ladona Ridgel.    Wille Glaser  Stroke Response RN

## 2022-08-03 NOTE — Assessment & Plan Note (Signed)
Allow for permissive HTN in setting of CVA eval  Prn IV labetalol for SBP>220 or DBP>110  Follow

## 2022-08-03 NOTE — Consult Note (Signed)
Neurology Consultation Reason for Consult: Aphasia Referring Physician: Nash Dimmer  CC: Aphasia  History is obtained from: Daughter, nursing home staff  HPI: Erik Larson is a 89 y.o. male who was in his normal state of health when he asked for assistance going to the bathroom about 10 AM.  Subsequently about 11 AM, his daughter noticed that he was very confused.  Due to this difficulty speaking and confusion he was brought in as a code stroke.   LKW: 10 AM tnk given?: no, elevated INR  Past Medical History:  Diagnosis Date   Arthritis    Atrial fibrillation (HCC)    controlled on coumadin   GERD (gastroesophageal reflux disease)    Glaucoma    Gout    Heart disease    Hyperlipidemia    Warfarin therapy started 07/29/2014     Family History  Problem Relation Age of Onset   Cancer Maternal Uncle    Alzheimer's disease Mother    Healthy Father    Healthy Sister    Healthy Sister    Healthy Sister    Heart disease Brother      Social History:  reports that he quit smoking about 54 years ago. His smoking use included cigars. He has never used smokeless tobacco. He reports that he does not currently use alcohol. He reports that he does not use drugs.   Exam: Current vital signs: There were no vitals taken for this visit. Vital signs in last 24 hours:     Physical Exam  Appears well-developed and well-nourished.   Neuro: Mental Status: Patient is awake, alert, he repeatedly answers "I do not know" when asking him questions.  He does not follow commands, and does not answer any questions. Cranial Nerves: II: He blinks to threat from the left but not right, though with his history of macular degeneration the significance of this is unclear.  Pupils are slightly irregular, but reactive bilaterally. III,IV, VI: He does look to the left, but I never see him cross midline to the right V: Facial sensation is symmetric to temperature VII: Facial movement with decreased  nasolabial fold on the left (also present in epic photo) Motor: He moves all extremities spontaneously, I do question a mild right leg drift, but this would be 4+/5 strength and is difficult due to patient cooperation to know if this is true.  Sensory: Responds to nox stim bilaterally Cerebellar: Does not perform, but no definite ataxia.    I have reviewed labs in epic and the results pertinent to this consultation are: CMP - unremarkable  I have reviewed the images obtained:CT/CTA - negative  Impression: 89 yo M With acute onset aphasia. I suspect that he has had an acute ischemic stroke, though the differential is broad at his age including septic encephalopathy, medication related issues, multifactorial delirium.   Recommendations: 1) MRI brian 2) lipids, tele, A1c, echo 3) decision on coumadin vs ASA following MRI 4) CK 5) UA, screen for infection per ED 6) will follow.    Ritta Slot, MD [facility] [phone]  If 7pm- 7am, please page neurology on call as listed in AMION.

## 2022-08-03 NOTE — H&P (Addendum)
History and Physical    Patient: Erik Larson MVH:[id number] DOB: [birth year] DOA: 08/03/2022 DOS: the patient was seen and examined on 08/03/2022 PCP: System, Provider Not In  Patient coming from: ALF/ILF  Chief Complaint:  Chief Complaint  Patient presents with   Code Stroke   HPI: Erik Larson is a 89 y.o. male with medical history significant of atrial fibrillation on Coumadin, GERD, hyperlipidemia, BPH/urinary retention status post suprapubic catheter, hypertension presenting with encephalopathy and suspected CVA.  Limited history in the setting of encephalopathy.  Patient confused and answering minimal questions.  Per report, patient with an acute change in mentation since earlier today.  Patient was noted to be acutely confused by his daughter around 24 AM.  Patient lives at a local assisted living facility.  Per report, patient usually conversive at baseline.  Had acute confusion as well as aphasia per report.  No reports of fevers or chills.  No reports of chest pain shortness of breath nausea vomiting.  Noted admission March 30 through April 2 for similar issues associated with UTI.  Had urine culture that showed Enterococcus faecalis and Corynebacterium species.  Completed course of IV ceftazidime as well as oral nitrofurantoin based on urine sensitivities.  No reports of recent change in urine pattern.  No reported abdominal pain or diarrhea. Presented to the ER afebrile, pressures 170s over 100s, satting well on room air.  White count 6.0, hemoglobin 12, platelets 234, urinalysis grossly stable apart from small leukocytes and rare bacteria, INR 2, creatinine 1.05.  Code stroke CT head as well as CTA head and neck grossly stable.  MRI of the brain pending.  Not a TNK candidate given elevated INR. Review of Systems: As mentioned in the history of present illness. All other systems reviewed and are negative. Past Medical History:  Diagnosis Date   Arthritis    Atrial fibrillation (HCC)     controlled on coumadin   GERD (gastroesophageal reflux disease)    Glaucoma    Gout    Heart disease    Hyperlipidemia    Warfarin therapy started 07/29/2014   Past Surgical History:  Procedure Laterality Date   COLONOSCOPY  2005 ?   GLAUCOMA SURGERY     IR CATHETER TUBE CHANGE  06/30/2022   Social History:  reports that he quit smoking about 54 years ago. His smoking use included cigars. He has never used smokeless tobacco. He reports that he does not currently use alcohol. He reports that he does not use drugs.  Allergies  Allergen Reactions   Ace Inhibitors    Penicillins Rash    Family History  Problem Relation Age of Onset   Cancer Maternal Uncle    Alzheimer's disease Mother    Healthy Father    Healthy Sister    Healthy Sister    Healthy Sister    Heart disease Brother     Prior to Admission medications   Medication Sig Start Date End Date Taking? Authorizing Provider  acetaminophen (TYLENOL) 500 MG tablet Take 500 mg by mouth every 6 (six) hours as needed for mild pain or moderate pain.   Yes Smiley Houseman, NP  allopurinol (ZYLOPRIM) 100 MG tablet Take 100 mg by mouth daily. 11/08/21  Yes [provider]  BACTRIM 400-80 MG tablet Take 1 tablet by mouth daily. 06/01/22  Yes Smiley Houseman, NP  Cholecalciferol 50 MCG (2000 UT) TABS Take 2,000 Units by mouth daily.   Yes Smiley Houseman, NP  Cranberry  450 MG TABS Take 450 mg by mouth daily.   Yes Smiley Houseman, NP  dorzolamide-timolol (COSOPT) 2-0.5 % ophthalmic solution Place 1 drop into both eyes 2 (two) times daily.   Yes [provider]  finasteride (PROSCAR) 5 MG tablet Take 1 tablet (5 mg total) by mouth daily. 07/07/17  Yes Duanne Limerick, MD  latanoprost (XALATAN) 0.005 % ophthalmic solution Place 1 drop into both eyes at bedtime. Dr Beverely Pace   Yes   melatonin 3 MG TABS tablet Take 3 mg by mouth at bedtime as needed.   Yes [provider]  mupirocin ointment (BACTROBAN) 2 % Apply 1  Application topically 2 (two) times daily.   Yes [provider]  nitrofurantoin (MACRODANTIN) 100 MG capsule Take 100 mg by mouth daily. 07/31/22  Yes [provider]  phenazopyridine (PYRIDIUM) 95 MG tablet Take 95 mg by mouth 3 (three) times daily as needed (urinary spasms).   Yes Smiley Houseman, NP  psyllium (REGULOID) 0.52 g capsule Take 0.52 g by mouth at bedtime.   Yes [provider]  risperiDONE (RISPERDAL) 0.5 MG tablet Take 0.5 mg by mouth at bedtime. 09/18/21  Yes [provider]  sennosides-docusate sodium (SENOKOT-S) 8.6-50 MG tablet Take one tablet by mouth at bedtime on Mondays, Wednesdays, and Fridays.   Yes Slade-Hartman, Alvino Chapel, MD  tamsulosin (FLOMAX) 0.4 MG CAPS capsule Take 0.4 mg by mouth daily. 08/05/21  Yes [provider]  warfarin (COUMADIN) 5 MG tablet Take 5 mg by mouth daily. For 5 days.   Yes Smiley Houseman, NP    Physical Exam: Vitals:   08/03/22 1314 08/03/22 1334 08/03/22 1335 08/03/22 1441  BP: (!) 176/102 (!) 170/113    Pulse:  75    Resp:  18    Temp:    98.8 F (37.1 C)  TempSrc:    Axillary  SpO2:  99%    Weight:   84.8 kg    Physical Exam Constitutional:      Appearance: He is normal weight.     Comments: Positive generalized confusion with minimal to mild agitation  HENT:     Head: Normocephalic.     Mouth/Throat:     Mouth: Mucous membranes are dry.  Eyes:     Pupils: Pupils are equal, round, and reactive to light.  Cardiovascular:     Rate and Rhythm: Normal rate and regular rhythm.  Pulmonary:     Effort: Pulmonary effort is normal.  Abdominal:     General: Bowel sounds are normal.  Musculoskeletal:     Comments: Positive generalized movements Unable to do focal neuroexam  Skin:    General: Skin is dry.  Neurological:     Comments: Positive generalized confusion with mild agitation Unable to do focal neuroexam though with no focal deficits noted Is not answering questions    Psychiatric:        Mood and Affect: Mood normal.     Data Reviewed:  There are no new results to review at this time. MR BRAIN WO CONTRAST CLINICAL DATA:  Neuro deficit, acute, stroke suspected aphasia  EXAM: MRI HEAD WITHOUT CONTRAST  TECHNIQUE: Multiplanar, multiecho pulse sequences of the brain and surrounding structures were obtained without intravenous contrast.  COMPARISON:  Same day CT head.  FINDINGS: Brain: No acute infarction, acute hemorrhage, hydrocephalus, extra-axial collection or mass lesion. Remote hemorrhagic infarct in the left basal ganglia. Additional scattered T2/FLAIR hyperintensities in the white matter are compatible with chronic microvascular  ischemic disease. Cerebral atrophy. Cavum septum pellucidum et vergae, anatomic variant.  Vascular: Major arterial flow voids are maintained at the skull base.  Skull and upper cervical spine: Normal marrow signal.  Sinuses/Orbits: Clear sinuses.  No acute orbital findings.  Other: No mastoid effusions.  IMPRESSION: 1. No evidence of acute intracranial abnormality. 2. Remote left basal ganglia infarct. 3.  Cerebral Atrophy (ICD10-G31.9).  Electronically Signed   By: Feliberto Harts M.D.   On: 08/03/2022 15:26 CT ANGIO HEAD NECK W WO CM (CODE STROKE) CLINICAL DATA:  Neuro deficit, acute, stroke suspected  EXAM: CT ANGIOGRAPHY HEAD AND NECK WITH AND WITHOUT CONTRAST  TECHNIQUE: Multidetector CT imaging of the head and neck was performed using the standard protocol during bolus administration of intravenous contrast. Multiplanar CT image reconstructions and MIPs were obtained to evaluate the vascular anatomy. Carotid stenosis measurements (when applicable) are obtained utilizing NASCET criteria, using the distal internal carotid diameter as the denominator.  RADIATION DOSE REDUCTION: This exam was performed according to the departmental dose-optimization program which includes  automated exposure control, adjustment of the mA and/or kV according to patient size and/or use of iterative reconstruction technique.  CONTRAST:  50mL OMNIPAQUE IOHEXOL 350 MG/ML SOLN  COMPARISON:  Same day CT head  FINDINGS: CT HEAD FINDINGS  See same day CT brain for intracranial findings.  No intracranial contrast-enhancing lesion.  CTA NECK FINDINGS  Aortic arch: Standard branching. Imaged portion shows no evidence of aneurysm or dissection. No significant stenosis of the major arch vessel origins.  Right carotid system: No evidence of dissection, stenosis (50% or greater), or occlusion. Mild (less than 50%) narrowing of the origin of the right ICA secondary to mixed calcified and noncalcified atherosclerotic plaque.  Left carotid system: No evidence of dissection, stenosis (50% or greater), or occlusion.  Vertebral arteries: Right vertebral artery terminates in a PICA. No evidence of dissection, stenosis (50% or greater), or occlusion.  Skeleton: Multilevel degenerative changes in the cervical spine.  Other neck: Negative.  Upper chest: Negative.  Review of the MIP images confirms the above findings  CTA HEAD FINDINGS  Anterior circulation: No significant stenosis, proximal occlusion, aneurysm, or vascular malformation.  Posterior circulation: No significant stenosis, proximal occlusion, aneurysm, or vascular malformation.  Venous sinuses: As permitted by contrast timing, patent.  Anatomic variants: Fetal PCAs bilaterally.  Review of the MIP images confirms the above findings  IMPRESSION: No intracranial large vessel occlusion or hemodynamically significant stenosis in the neck.  Electronically Signed   By: Lorenza Cambridge M.D.   On: 08/03/2022 13:37 CT HEAD CODE STROKE WO CONTRAST CLINICAL DATA:  Code stroke. Neuro deficit, acute, stroke suspected. Aphasia.  EXAM: CT HEAD WITHOUT CONTRAST  TECHNIQUE: Contiguous axial images were obtained from  the base of the skull through the vertex without intravenous contrast.  RADIATION DOSE REDUCTION: This exam was performed according to the departmental dose-optimization program which includes automated exposure control, adjustment of the mA and/or kV according to patient size and/or use of iterative reconstruction technique.  COMPARISON:  Prior head CT examinations 06/11/2022 and earlier.  FINDINGS: Brain:  Generalized parenchymal atrophy.  Redemonstrated chronic infarct within the left basal ganglia/subinsular white matter.  Background mild patchy and ill-defined hypoattenuation within the cerebral white matter, nonspecific but compatible with chronic small ischemic disease.  Cavum septum pellucidum and cavum vergae (anatomic variant).  There is no acute intracranial hemorrhage.  No demarcated cortical infarct.  No extra-axial fluid collection.  No evidence of an intracranial mass.  No midline  shift.  Vascular: No hyperdense vessel.  Atherosclerotic calcifications.  Skull: No fracture or aggressive osseous lesion.  Sinuses/Orbits: No mass or acute finding within the imaged orbits. Mild mucosal thickening within the left maxillary sinus at the imaged levels.  ASPECTS [facility] Stroke Program Early CT Score)  - Ganglionic level infarction (caudate, lentiform nuclei, internal capsule, insula, M1-M3 cortex): 7  - Supraganglionic infarction (M4-M6 cortex): 3  Total score (0-10 with 10 being normal): 10 (when discounting the chronic infarct).  No evidence of an acute intracranial abnormality. These results were communicated to Dr. Amada Jupiter at 1:21 pmon 5/22/2024by text page via the [facility] messaging system.  IMPRESSION: 1. No evidence of an acute intracranial abnormality. 2. Redemonstrated chronic infarct within the left basal ganglia/subinsular white matter. 3. Background mild cerebral white matter chronic small vessel disease. 4. Generalized parenchymal  atrophy.  Electronically Signed   By: Jackey Loge D.O.   On: 08/03/2022 13:21  Lab Results  Component Value Date   WBC 6.0 08/03/2022   HGB 12.0 (L) 08/03/2022   HCT 35.6 (L) 08/03/2022   MCV 96.0 08/03/2022   PLT 234 08/03/2022   Last metabolic panel Lab Results  Component Value Date   GLUCOSE 99 08/03/2022   NA 136 08/03/2022   K 4.4 08/03/2022   CL 103 08/03/2022   CO2 25 08/03/2022   BUN 18 08/03/2022   CREATININE 1.05 08/03/2022   GFRNONAA >60 08/03/2022   CALCIUM 9.0 08/03/2022   PHOS 2.7 06/14/2017   PROT 7.7 08/03/2022   ALBUMIN 3.9 08/03/2022   BILITOT 0.9 08/03/2022   ALKPHOS 86 08/03/2022   AST 22 08/03/2022   ALT 15 08/03/2022   ANIONGAP 8 08/03/2022    Assessment and Plan: * CVA (cerebral vascular accident) (HCC) Noted acute change in mentation with aphasia and confusion per report Code stroke called Patient tPA candidate given Coumadin use with therapeutic INR CT head and CTA of the head neck grossly stable MRI of the brain pending Appreciate neurology evaluation Will continue with workup including risk stratification labs, 2D echo ASA versus Coumadin pending MRI per neurology recommendations Follow  Acute encephalopathy Acute encephalopathy with concern for CVA in setting of aphasia Differential diagnosis also includes toxic metabolic and infectious etiologies Noted history of catheter associated UTIs though with no active fever, white count Urinalysis grossly stable Will urine culture and monitor for now Imaging of the brain including CT head and CTA of the head neck grossly stable MRI pending Exchange Foley Check ammonia level Mildly dry-gently hydrate patient Reassess as clinically appropriate Follow  Essential hypertension Allow for permissive HTN in setting of CVA eval  Prn IV labetalol for SBP>220 or DBP>110  Follow   Benign prostatic hyperplasia with lower urinary tract symptoms Status post chronic indwelling suprapubic  catheter Will exchange in the setting of presentation for encephalopathy   Acid reflux PPI  Atrial fibrillation, controlled (HCC) Rate controlled on presentation Coumadin per pharmacy Consider low dose amio vs. Metoprolol for RVR overnight so as to avoid significant hypotension  Follow      Advance Care Planning:   Code Status: Full Code   Consults: Neurology   Family Communication: No family at the bedside   Severity of Illness: The appropriate patient status for this patient is INPATIENT. Inpatient status is judged to be reasonable and necessary in order to provide the required intensity of service to ensure the patient's safety. The patient's presenting symptoms, physical exam findings, and initial radiographic and laboratory data in the context  of their chronic comorbidities is felt to place them at high risk for further clinical deterioration. Furthermore, it is not anticipated that the patient will be medically stable for discharge from the hospital within 2 midnights of admission.   * I certify that at the point of admission it is my clinical judgment that the patient will require inpatient hospital care spanning beyond 2 midnights from the point of admission due to high intensity of service, high risk for further deterioration and high frequency of surveillance required.*  Author: Floydene Flock, MD 08/03/2022 3:53 PM  For on call review [email or web address].

## 2022-08-03 NOTE — Progress Notes (Signed)
CODE STROKE- PHARMACY COMMUNICATION  Time CODE STROKE called/page received: [phone]  Time response to CODE STROKE was made (in person or via phone): Immediately  Time Stroke Kit retrieved from [employer] (only if needed): Not a candidate for TNK given INR of 2 and on warfarin  Name of Provider/Nurse contacted: Dr. Amada Jupiter  Past Medical History:  Diagnosis Date   Arthritis    Atrial fibrillation (HCC)    controlled on coumadin   GERD (gastroesophageal reflux disease)    Glaucoma    Gout    Heart disease    Hyperlipidemia    Warfarin therapy started 07/29/2014   Prior to Admission medications   Medication Sig Start Date End Date Taking? Authorizing Provider  acetaminophen (TYLENOL) 500 MG tablet Take 500 mg by mouth every 6 (six) hours as needed for mild pain or moderate pain.    Smiley Houseman, NP  allopurinol (ZYLOPRIM) 100 MG tablet Take 100 mg by mouth daily. 11/08/21   [provider]  BACTRIM 400-80 MG tablet Take 1 tablet by mouth daily. 06/01/22   Smiley Houseman, NP  Cholecalciferol 50 MCG (2000 UT) TABS Take 2,000 Units by mouth daily.    Smiley Houseman, NP  Cranberry 450 MG TABS Take 450 mg by mouth daily.    Smiley Houseman, NP  dorzolamide-timolol (COSOPT) 2-0.5 % ophthalmic solution Place 1 drop into both eyes 2 (two) times daily.    [provider]  finasteride (PROSCAR) 5 MG tablet Take 1 tablet (5 mg total) by mouth daily. 07/07/17   Duanne Limerick, MD  latanoprost (XALATAN) 0.005 % ophthalmic solution Place 1 drop into both eyes at bedtime. Dr Beverely Pace      melatonin 3 MG TABS tablet Take 3 mg by mouth at bedtime as needed.    [provider]  phenazopyridine (PYRIDIUM) 95 MG tablet Take 95 mg by mouth 3 (three) times daily as needed (urinary spasms).    Smiley Houseman, NP  psyllium (REGULOID) 0.52 g capsule Take 0.52 g by mouth at bedtime.    [provider]  risperiDONE (RISPERDAL) 0.5 MG tablet Take 0.5 mg by mouth at bedtime. 09/18/21    [provider]  sennosides-docusate sodium (SENOKOT-S) 8.6-50 MG tablet Take one tablet by mouth at bedtime on Mondays, Wednesdays, and Fridays.    Keane Police, MD  tamsulosin (FLOMAX) 0.4 MG CAPS capsule Take 0.4 mg by mouth daily. 08/05/21   [provider]  warfarin (COUMADIN) 7.5 MG tablet Take 7.5 mg by mouth daily. For 5 days.    Smiley Houseman, NP   Tressie Ellis 08/03/2022  1:58 PM

## 2022-08-03 NOTE — Assessment & Plan Note (Signed)
Noted acute change in mentation with aphasia and confusion per report Code stroke called Patient tPA candidate given Coumadin use with therapeutic INR CT head and CTA of the head neck grossly stable MRI of the brain pending Appreciate neurology evaluation Will continue with workup including risk stratification labs, 2D echo ASA versus Coumadin pending MRI per neurology recommendations Follow

## 2022-08-03 NOTE — Assessment & Plan Note (Signed)
Status post chronic indwelling suprapubic catheter Will exchange in the setting of presentation for encephalopathy

## 2022-08-03 NOTE — ED Notes (Signed)
EKG given to Wong, MD 

## 2022-08-03 NOTE — Assessment & Plan Note (Addendum)
Acute encephalopathy with concern for CVA in setting of aphasia Differential diagnosis also includes toxic metabolic and infectious etiologies Noted history of catheter associated UTIs though with no active fever, white count Urinalysis grossly stable Will urine culture and monitor for now Imaging of the brain including CT head and CTA of the head neck grossly stable MRI pending Exchange Foley Check ammonia level Mildly dry-gently hydrate patient Reassess as clinically appropriate Follow

## 2022-08-03 NOTE — Assessment & Plan Note (Signed)
Rate controlled on presentation Coumadin per pharmacy Follow

## 2022-08-04 ENCOUNTER — Encounter: Payer: Self-pay | Admitting: Family Medicine

## 2022-08-04 ENCOUNTER — Inpatient Hospital Stay: Payer: Medicare Other

## 2022-08-04 DIAGNOSIS — G9341 Metabolic encephalopathy: Secondary | ICD-10-CM

## 2022-08-04 DIAGNOSIS — Z515 Encounter for palliative care: Secondary | ICD-10-CM | POA: Diagnosis not present

## 2022-08-04 DIAGNOSIS — Z7189 Other specified counseling: Secondary | ICD-10-CM | POA: Diagnosis not present

## 2022-08-04 DIAGNOSIS — R4182 Altered mental status, unspecified: Secondary | ICD-10-CM | POA: Diagnosis not present

## 2022-08-04 LAB — VITAMIN D 25 HYDROXY (VIT D DEFICIENCY, FRACTURES): Vit D, 25-Hydroxy: 29.72 ng/mL — ABNORMAL LOW (ref 30–100)

## 2022-08-04 LAB — URINE CULTURE: Culture: 60000 — AB

## 2022-08-04 LAB — GLUCOSE, CAPILLARY
Glucose-Capillary: 100 mg/dL — ABNORMAL HIGH (ref 70–99)
Glucose-Capillary: 117 mg/dL — ABNORMAL HIGH (ref 70–99)

## 2022-08-04 LAB — ECHOCARDIOGRAM COMPLETE
P 1/2 time: 704 msec
S' Lateral: 2.9 cm
Weight: 2992 oz

## 2022-08-04 LAB — IRON AND TIBC
Iron: 90 ug/dL (ref 45–182)
Saturation Ratios: 39 % (ref 17.9–39.5)
TIBC: 234 ug/dL — ABNORMAL LOW (ref 250–450)
UIBC: 144 ug/dL

## 2022-08-04 LAB — LIPID PANEL
Cholesterol: 155 mg/dL (ref 0–200)
HDL: 33 mg/dL — ABNORMAL LOW (ref >40)
LDL Cholesterol: 89 mg/dL (ref 0–99)
Total CHOL/HDL Ratio: 4.7 RATIO
Triglycerides: 163 mg/dL — ABNORMAL HIGH (ref <150)
VLDL: 33 mg/dL (ref 0–40)

## 2022-08-04 LAB — PHOSPHORUS: Phosphorus: 3.7 mg/dL (ref 2.5–4.6)

## 2022-08-04 LAB — CBG MONITORING, ED: Glucose-Capillary: 120 mg/dL — ABNORMAL HIGH (ref 70–99)

## 2022-08-04 LAB — TSH: TSH: 2.634 u[IU]/mL (ref 0.350–4.500)

## 2022-08-04 LAB — PROTIME-INR
INR: 1.7 — ABNORMAL HIGH (ref 0.8–1.2)
Prothrombin Time: 20.2 seconds — ABNORMAL HIGH (ref 11.4–15.2)

## 2022-08-04 LAB — MAGNESIUM: Magnesium: 1.9 mg/dL (ref 1.7–2.4)

## 2022-08-04 LAB — FOLATE: Folate: 25 ng/mL (ref >5.9)

## 2022-08-04 LAB — VITAMIN B12: Vitamin B-12: 124 pg/mL — ABNORMAL LOW (ref 180–914)

## 2022-08-04 MED ORDER — WARFARIN SODIUM 6 MG PO TABS
6.0000 mg | ORAL_TABLET | Freq: Once | ORAL | Status: AC
  Administered 2022-08-04: 6 mg via ORAL
  Filled 2022-08-04: qty 1

## 2022-08-04 MED ORDER — ENSURE ENLIVE PO LIQD
237.0000 mL | Freq: Three times a day (TID) | ORAL | Status: DC
  Administered 2022-08-04 – 2022-08-10 (×18): 237 mL via ORAL

## 2022-08-04 MED ORDER — METOPROLOL TARTRATE 25 MG PO TABS
12.5000 mg | ORAL_TABLET | Freq: Two times a day (BID) | ORAL | Status: DC
  Administered 2022-08-04: 12.5 mg via ORAL
  Filled 2022-08-04 (×2): qty 1

## 2022-08-04 MED ORDER — ONDANSETRON HCL 4 MG/2ML IJ SOLN: 4.0000 mg | Freq: Four times a day (QID) | INTRAMUSCULAR | Status: DC | PRN

## 2022-08-04 MED ORDER — SODIUM CHLORIDE 0.9 % IV SOLN
1.0000 g | Freq: Two times a day (BID) | INTRAVENOUS | Status: DC
  Administered 2022-08-04 – 2022-08-08 (×8): 1 g via INTRAVENOUS
  Filled 2022-08-04 (×10): qty 1

## 2022-08-04 MED ORDER — ACETAMINOPHEN 325 MG PO TABS: 650.0000 mg | ORAL_TABLET | Freq: Four times a day (QID) | ORAL | Status: DC | PRN

## 2022-08-04 MED ORDER — ADULT MULTIVITAMIN W/MINERALS CH
1.0000 | ORAL_TABLET | Freq: Every day | ORAL | Status: DC
  Administered 2022-08-04 – 2022-08-10 (×7): 1 via ORAL
  Filled 2022-08-04 (×7): qty 1

## 2022-08-04 MED ORDER — METOPROLOL TARTRATE 5 MG/5ML IV SOLN: 5.0000 mg | Freq: Four times a day (QID) | INTRAVENOUS | Status: DC | PRN

## 2022-08-04 MED ORDER — DEXTROSE 50 % IV SOLN: 12.5000 g | INTRAVENOUS | Status: AC

## 2022-08-04 MED ORDER — SODIUM CHLORIDE 0.9 % IV SOLN
1.0000 g | INTRAVENOUS | Status: DC
  Administered 2022-08-04: 1 g via INTRAVENOUS
  Filled 2022-08-04: qty 10

## 2022-08-04 NOTE — Progress Notes (Signed)
EEG complete - results pending 

## 2022-08-04 NOTE — Procedures (Signed)
History: 89 yo M with AMS  Sedation: none  Technique: This EEG was acquired with electrodes placed according to the International 10-20 electrode system (including Fp1, Fp2, F3, F4, C3, C4, P3, P4, O1, O2, T3, T4, T5, T6, A1, A2, Fz, Cz, Pz). The following electrodes were missing or displaced: none.   Background: There is a posterior dominant rhythm that is poorly formed and poorly sustained of 7-8 Hz. There is also diffuse irregular slow activity throughout the study. With arousal, there are frontally predominant discharges that are periodic with a frequency of 1 Hz with triphasic morphology.   Photic stimulation: Physiologic driving is not perofrmed  EEG Abnormalities: 1) Generalized periodic discharges(GPDs) with triphasic morphology 2) Generalized slow activity 3) Slow PDR  Clinical Interpretation: This . There was no seizure or seizure predisposition recorded on this study. Please note that lack of epileptiform activity on EEG does not preclude the possibility of epilepsy.   Ritta Slot, MD [facility] [phone]  If 7pm- 7am, please page neurology on call as listed in AMION.

## 2022-08-04 NOTE — Progress Notes (Signed)
       CROSS COVER NOTE  NAME: Erik Larson MRN: [id number] DOB : 05/12/1924    HPI/Events of Note   Report:stroke ruled out can we discontinue NIH evaluations every 4h. 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 MRI, CT negative for acute stroke. Neurology following. Presumed to encephalopathy from UTI Found history of glaucoma, has had cataract extractions and has also suffered from visual hallucinations and blurred vision in both eyes. 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 Med verified as at bedtime on admission.  Given the metabolism of risperdal and active metabolites being extensively cleared by kidneys, it may be contributing factor in altered mental status when he has UTI    Assessment and  Interventions   Assessment:  Plan: Flag as Vision Impaired ad accommodate care measures Discontinue NIH assessments       Donnie Mesa NP [facility] Cross Cover 7pm-7am - check amion for availability Pager [phone]

## 2022-08-04 NOTE — Evaluation (Signed)
Clinical/Bedside Swallow Evaluation Patient Details  Name: Erik Larson MRN: [id number] Date of Birth: [birth year]  Today's Date: 08/04/2022 Time: SLP Start Time (ACUTE ONLY): 0905 SLP Stop Time (ACUTE ONLY): 0925 SLP Time Calculation (min) (ACUTE ONLY): 20 min  Past Medical History:  Past Medical History:  Diagnosis Date   Arthritis    Atrial fibrillation (HCC)    controlled on coumadin   GERD (gastroesophageal reflux disease)    Glaucoma    Gout    Heart disease    Hyperlipidemia    Warfarin therapy started 07/29/2014   Past Surgical History:  Past Surgical History:  Procedure Laterality Date   COLONOSCOPY  2005 ?   GLAUCOMA SURGERY     IR CATHETER TUBE CHANGE  06/30/2022   HPI:  Per H&P "Erik Larson is a 89 y.o. male with medical history significant of atrial fibrillation on Coumadin, GERD, hyperlipidemia, BPH/urinary retention status post suprapubic catheter, hypertension presenting with encephalopathy and suspected CVA.  Limited history in the setting of encephalopathy.  Patient confused and answering minimal questions.  Per report, patient with an acute change in mentation since earlier today.  Patient was noted to be acutely confused by his daughter around 107 AM.  Patient lives at a local assisted living facility.  Per report, patient usually conversive at baseline.  Had acute confusion as well as aphasia per report.  No reports of fevers or chills.  No reports of chest pain shortness of breath nausea vomiting.  Noted admission March 30 through April 2 for similar issues associated with UTI.  Had urine culture that showed Enterococcus faecalis and Corynebacterium species.  Completed course of IV ceftazidime as well as oral nitrofurantoin based on urine sensitivities.  No reports of recent change in urine pattern.  No reported abdominal pain or diarrhea.  Presented to the ER afebrile, pressures 170s over 100s, satting well on room air.  White count 6.0, hemoglobin 12, platelets  234, urinalysis grossly stable apart from small leukocytes and rare bacteria, INR 2, creatinine 1.05.  Code stroke CT head as well as CTA head and neck grossly stable.  MRI of the brain pending.  Not a TNK candidate given elevated INR.  Review of Systems: As mentioned in the history of present illness. All other systems reviewed and are negative." MRI brain "1. No evidence of acute intracranial abnormality.  2. Remote left basal ganglia infarct.  3.  Cerebral Atrophy (ICD10-G31.9)."    Assessment / Plan / Recommendation  Clinical Impression  Pt seen for clinical swallowing evaluation. Pt lethargic, rouses to voice. HOH. Limited verbalizations except "I don't know." Daughter present. Daughter stated pt consumed a regular diet with thin liquids PTA - mainly finger foods due to visual deficits. Pt given trials of solid, pureed, and thin liquids. Mildly prolonged mastication of solids noted which is likely due to dental status and exacerbated by mental status. Pharyngeal swallow is [facility] per clinical assessment. No overt s/sx pharyngeal dysphagia. Recommend initiation of a mech soft diet with thin liquids - finger foods for promotion of self-feeding. Assistance with feeding PRN. Standard aspiration and reflux precautions. Pt is at increased risk for aspiration/aspiration PNA given lethargy, stroke, and dental status. Pt would benefit from cognitive-linguistic evaluation pending improvements in participation.  SLP Visit Diagnosis: Dysphagia, oral phase (R13.11)    Aspiration Risk  Mild aspiration risk    Diet Recommendation Dysphagia 3 (Mech soft);Thin liquid   Medication Administration: Whole meds with puree Supervision: Staff to assist with self feeding;Full  supervision/cueing for compensatory strategies Compensations: Slow rate;Small sips/bites Postural Changes: Seated upright at 90 degrees;Remain upright for at least 30 minutes after po intake    Other  Recommendations Oral Care Recommendations: Oral  care QID    Recommendations for follow up therapy are one component of a multi-disciplinary discharge planning process, led by the attending physician.  Recommendations may be updated based on patient status, additional functional criteria and insurance authorization.  Follow up Recommendations Follow physician's recommendations for discharge plan and follow up therapies         Functional Status Assessment Patient has had a recent decline in their functional status and demonstrates the ability to make significant improvements in function in a reasonable and predictable amount of time.  Frequency and Duration min 2x/week  2 weeks       Prognosis Prognosis for improved oropharyngeal function: Fair Barriers to Reach Goals:  (baseline)      Swallow Study   General Date of Onset: 08/03/22 HPI: Per H&P "Erik Larson is a 89 y.o. male with medical history significant of atrial fibrillation on Coumadin, GERD, hyperlipidemia, BPH/urinary retention status post suprapubic catheter, hypertension presenting with encephalopathy and suspected CVA.  Limited history in the setting of encephalopathy.  Patient confused and answering minimal questions.  Per report, patient with an acute change in mentation since earlier today.  Patient was noted to be acutely confused by his daughter around 2 AM.  Patient lives at a local assisted living facility.  Per report, patient usually conversive at baseline.  Had acute confusion as well as aphasia per report.  No reports of fevers or chills.  No reports of chest pain shortness of breath nausea vomiting.  Noted admission March 30 through April 2 for similar issues associated with UTI.  Had urine culture that showed Enterococcus faecalis and Corynebacterium species.  Completed course of IV ceftazidime as well as oral nitrofurantoin based on urine sensitivities.  No reports of recent change in urine pattern.  No reported abdominal pain or diarrhea.  Presented to the ER  afebrile, pressures 170s over 100s, satting well on room air.  White count 6.0, hemoglobin 12, platelets 234, urinalysis grossly stable apart from small leukocytes and rare bacteria, INR 2, creatinine 1.05.  Code stroke CT head as well as CTA head and neck grossly stable.  MRI of the brain pending.  Not a TNK candidate given elevated INR.  Review of Systems: As mentioned in the history of present illness. All other systems reviewed and are negative." MRI brain "1. No evidence of acute intracranial abnormality.  2. Remote left basal ganglia infarct.  3.  Cerebral Atrophy (ICD10-G31.9)." Type of Study: Bedside Swallow Evaluation Previous Swallow Assessment: none Diet Prior to this Study: Dysphagia 3 (mechanical soft);Thin liquids (Level 0) (finger foods) Temperature Spikes Noted: No Respiratory Status: Room air History of Recent Intubation: No Behavior/Cognition: Lethargic/Drowsy Oral Care Completed by SLP: Recent completion by staff Oral Cavity - Dentition: Dentures, bottom;Dentures, top Vision: Impaired for self-feeding Self-Feeding Abilities: Total assist Patient Positioning: Upright in bed Baseline Vocal Quality: Normal Volitional Cough: Cognitively unable to elicit Volitional Swallow: Unable to elicit    Oral/Motor/Sensory Function Overall Oral Motor/Sensory Function:  (unable to follow commands; no functional deficits appreciated)   Ice Chips Ice chips: Not tested   Thin Liquid Thin Liquid: Within functional limits Presentation: Cup;Straw    Nectar Thick Nectar Thick Liquid: Not tested   Honey Thick Honey Thick Liquid: Not tested   Puree Puree: Within functional limits  Presentation: Spoon   Solid     Solid: Impaired Presentation: Spoon Oral Phase Impairments: Impaired mastication Oral Phase Functional Implications: Impaired mastication Pharyngeal Phase Impairments:  (appeared WFL)     Clyde Canterbury, M.S., CCC-SLP Speech-Language Pathologist [place] [facility] [phone] (ASCOM)  Alessandra Bevels Jaquin Coy 08/04/2022,11:19 AM

## 2022-08-04 NOTE — ED Notes (Signed)
Wife at [facility], given update. Pt participatory with PT/OT assessment at this time. Pending completion of exam and note.

## 2022-08-04 NOTE — ED Notes (Signed)
Admitting MD finished at [facility]. Returned to sleeping, NAD, calm.

## 2022-08-04 NOTE — Consult Note (Addendum)
ANTICOAGULATION CONSULT NOTE - Initial Consult  Pharmacy Consult for Warfarin Indication: atrial fibrillation  Allergies  Allergen Reactions   Ace Inhibitors    Penicillins Rash    Patient Measurements: Weight: 84.8 kg (187 lb)  Vital Signs: Temp: 98 F (36.7 C) (05/23 0616) Temp Source: Axillary (05/23 0616) BP: 105/75 (05/23 0616) Pulse Rate: 81 (05/23 0616)  Labs: Recent Labs    08/03/22 1307 08/04/22 0521  HGB 12.0*  --   HCT 35.6*  --   PLT 234  --   APTT 37*  --   LABPROT 22.5* 20.2*  INR 2.0* 1.7*  CREATININE 1.05  --      Estimated Creatinine Clearance: 43.8 mL/min (by C-G formula based on SCr of 1.05 mg/dL).   Medical History: Past Medical History:  Diagnosis Date   Arthritis    Atrial fibrillation (HCC)    controlled on coumadin   GERD (gastroesophageal reflux disease)    Glaucoma    Gout    Heart disease    Hyperlipidemia    Warfarin therapy started 07/29/2014    Medications:  PTA Warfarin: 35 mg/wk 5 mg daily  Assessment: Erik Larson is a 89 y.o. male presenting with concern for CVA. PMH significant for arthritis, AF, GERD, HLD, gout. Patient was on Warfarin PTA per chart review. Last dose of Warfarin PTA was on 08/02/2022 @ 1610 (5 mg). Of note, INR was 2.0 on home regimen on arrival. Per neurology, ok to resume home warfarin. Pharmacy has been consulted to initiate and manage warfarin.    Baseline Labs: aPTT 37, PT 22.5, INR 2.0, Hgb 12.0, Hct 35.6, Plt 236   Goal of Therapy:  INR 2-3 Monitor platelets by anticoagulation protocol: Yes   Date    INR      Warfarin Dose    5/22 2.0 5 mg (PTA)>>dose not given 5/23 1.7 6 mg  Plan:  Dose ordered for 5/22 was not given Give warfarin 6 mg x1 dose today 5/23  Check INR daily until stable Check CBC at least every 72 hours  Barrie Folk, PharmD 08/04/2022 7:24 AM

## 2022-08-04 NOTE — Progress Notes (Signed)
Initial Nutrition Assessment  DOCUMENTATION CODES:   Not applicable  INTERVENTION:  - Add Ensure Enlive po TID, each supplement provides 350 kcal and 20 grams of protein.  - Add MVI q day.   NUTRITION DIAGNOSIS:   Inadequate oral intake related to inability to eat, lethargy/confusion as evidenced by meal completion < 25%.  GOAL:   Patient will meet greater than or equal to 90% of their needs  MONITOR:   PO intake, Supplement acceptance  REASON FOR ASSESSMENT:   Consult Assessment of nutrition requirement/status  ASSESSMENT:   89 y.o. male admits related to encephalopathy. PMH includes: arthritis, afib, GERD, glaucoma, gout, heart disease, HLD. Pt is currently receiving medical management related to CVA.  Meds reviewed: risperdal, warfarin. Labs reviewed: WDL.   The pt was sleeping at time of assessment. Pt was also getting an EEG at time of assessment. Pt is currently disoriented x4. Provider who was performing EEG states that the pt was very drowsy and unable to really answer questions. No family present at bedside. RD will add Ensure Enlive with meals.   NUTRITION - FOCUSED PHYSICAL EXAM:  Pt getting EEG and disoriented, will attempt at follow up.   Diet Order:   Diet Order             DIET DYS 3 Room service appropriate? Yes with Assist; Fluid consistency: Thin  Diet effective now                   EDUCATION NEEDS:   Not appropriate for education at this time  Skin:  Skin Assessment: Reviewed RN Assessment  Last BM:  PTA  Height:   Ht Readings from Last 1 Encounters:  05/27/22 5\' 7"  (1.702 m)    Weight:   Wt Readings from Last 1 Encounters:  08/03/22 84.8 kg    Ideal Body Weight:     BMI:  Body mass index is 29.29 kg/m.  Estimated Nutritional Needs:   Kcal:  6045-4098 kcals  Protein:  105-125 gm  Fluid:  >/= 2.1 L  Bethann Humble, RD, LDN, CNSC.

## 2022-08-04 NOTE — Evaluation (Signed)
Physical Therapy Evaluation Patient Details Name: Erik Larson MRN: [id number] DOB: [birth year] Today's Date: 08/04/2022  History of Present Illness  89 y/o male presented to ED on 08/03/22 for AMS and aphasia. CT and MRI negative. Admitted for acute encephalopathy. PMH: glaucome, Afib, HTN  Clinical Impression  Patient admitted with the above. PTA, patient from [facility] where the staff assist with all ADLs and ambulatory with RW. Daughter at bedside and reports typically A&Ox4 and has visual impairments. Patient presents with weakness, impaired balance, decreased activity tolerance, and impaired cognition. Lethargic on evaluation but able to follow simple commands. Patient required supervision for supine>sit and stood at EOB with min guard+2(for safety). Took side steps at EOB with RW and minA+2 for RW advancement. Required maxA to return to supine and quickly returned to sleep. Patient will benefit from skilled PT services during acute stay to address listed deficits. Patient will benefit from ongoing therapy at discharge to maximize functional independence and safety.      Recommendations for follow up therapy are one component of a multi-disciplinary discharge planning process, led by the attending physician.  Recommendations may be updated based on patient status, additional functional criteria and insurance authorization.  Follow Up Recommendations       Assistance Recommended at Discharge Frequent or constant Supervision/Assistance  Patient can return home with the following  A lot of help with walking and/or transfers;A lot of help with bathing/dressing/bathroom;Assistance with cooking/housework;Assistance with feeding;Direct supervision/assist for medications management;Direct supervision/assist for financial management;Assist for transportation    Equipment Recommendations None recommended by PT  Recommendations for Other Services       Functional Status Assessment Patient has  had a recent decline in their functional status and demonstrates the ability to make significant improvements in function in a reasonable and predictable amount of time.     Precautions / Restrictions Precautions Precautions: Fall Restrictions Weight Bearing Restrictions: No      Mobility  Bed Mobility Overal bed mobility: Needs Assistance Bed Mobility: Supine to Sit, Sit to Supine     Supine to sit: Supervision, Min guard, HOB elevated Sit to supine: Max assist, HOB elevated   General bed mobility comments: increased time to complete    Transfers Overall transfer level: Needs assistance Equipment used: Rolling Belma Dyches (2 wheels) Transfers: Sit to/from Stand Sit to Stand: Min guard, +2 safety/equipment                Ambulation/Gait Ambulation/Gait assistance: Min assist, +2 physical assistance Gait Distance (Feet): 3 Feet Assistive device: Rolling Tekeshia Klahr (2 wheels) Gait Pattern/deviations: Step-to pattern Gait velocity: decreased     General Gait Details: sidesteps towards HOB. Assistance for RW advancement  Stairs            Wheelchair Mobility    Modified Rankin (Stroke Patients Only)       Balance Overall balance assessment: Needs assistance Sitting-balance support: Feet supported, No upper extremity supported Sitting balance-Leahy Scale: Good     Standing balance support: Reliant on assistive device for balance, Bilateral upper extremity supported Standing balance-Leahy Scale: Fair                               Pertinent Vitals/Pain Pain Assessment Pain Assessment: Faces Faces Pain Scale: Hurts a little bit Pain Location: Headache Pain Descriptors / Indicators: Aching, Sore Pain Intervention(s): Monitored during session, Repositioned    Home Living Family/patient expects to be discharged to:: Assisted living  Home Equipment: Agricultural consultant (2 wheels)      Prior Function Prior Level of Function :  Needs assist  Cognitive Assist : Mobility (cognitive);ADLs (cognitive)           Mobility Comments: Per dtr at bedside, pt is ambulatory with RW at his facility. Does not go far, tends to sleep alot during the day and stay in his chair, but can transfer to/from bathroom etc. ADLs Comments: Pt requires assistance with most ADL tasks including set up assist for feeding/grooming and physical assist for bathing and dressing per dtr.     Hand Dominance   Dominant Hand: Right    Extremity/Trunk Assessment   Upper Extremity Assessment Upper Extremity Assessment: Generalized weakness    Lower Extremity Assessment Lower Extremity Assessment: Generalized weakness    Cervical / Trunk Assessment Cervical / Trunk Assessment: Kyphotic  Communication   Communication: No difficulties;HOH  Cognition Arousal/Alertness: Lethargic Behavior During Therapy: WFL for tasks assessed/performed Overall Cognitive Status: Impaired/Different from baseline Area of Impairment: Orientation, Following commands                 Orientation Level: Disoriented to, Person, Place, Time, Situation     Following Commands: Follows one step commands with increased time, Follows one step commands inconsistently       General Comments: Per dtr at bedside, pt is not at baseline for cognition. He is unable to state his name or DOB. Generally responds "yeah, yeah" to questions. Follows VCs with incrased time and multimodal cueing.        General Comments      Exercises     Assessment/Plan    PT Assessment Patient needs continued PT services  PT Problem List Decreased strength;Decreased activity tolerance;Decreased balance;Decreased mobility;Decreased cognition;Decreased knowledge of use of DME;Decreased safety awareness;Decreased knowledge of precautions       PT Treatment Interventions DME instruction;Gait training;Functional mobility training;Therapeutic activities;Therapeutic exercise;Balance  training;Patient/family education    PT Goals (Current goals can be found in the Care Plan section)  Acute Rehab PT Goals Patient Stated Goal: did not state PT Goal Formulation: With family Time For Goal Achievement: 08/18/22 Potential to Achieve Goals: Fair    Frequency Min 2X/week     Co-evaluation   Reason for Co-Treatment: Necessary to address cognition/behavior during functional activity;For patient/therapist safety;To address functional/ADL transfers PT goals addressed during session: Mobility/safety with mobility;Balance OT goals addressed during session: ADL's and self-care;Proper use of Adaptive equipment and DME       AM-PAC PT "6 Clicks" Mobility  Outcome Measure Help needed turning from your back to your side while in a flat bed without using bedrails?: A Little Help needed moving from lying on your back to sitting on the side of a flat bed without using bedrails?: A Little Help needed moving to and from a bed to a chair (including a wheelchair)?: A Little Help needed standing up from a chair using your arms (e.g., wheelchair or bedside chair)?: A Little Help needed to walk in hospital room?: A Lot Help needed climbing 3-5 steps with a railing? : Total 6 Click Score: 15    End of Session   Activity Tolerance: Patient tolerated treatment well Patient left: in bed;with call bell/phone within reach;with bed alarm set;with family/visitor present Nurse Communication: Mobility status PT Visit Diagnosis: Unsteadiness on feet (R26.81);Muscle weakness (generalized) (M62.81);Difficulty in walking, not elsewhere classified (R26.2)    Time: 1610-9604 PT Time Calculation (min) (ACUTE ONLY): 27 min   Charges:   PT Evaluation $  PT Eval Moderate Complexity: 1 Mod          Maylon Peppers, PT, DPT Physical Therapist - [facility]  [facility]   Aamari West A Noell Shular 08/04/2022, 12:06 PM

## 2022-08-04 NOTE — ED Notes (Signed)
Pt sleeping, arousable to voice, preoccupied with sleep, speech slurred and intelligible. Verbalizes "I don't know to many questions". Semi-cooperative. Follows some commands, answers some questions. Inconsistent exam. NIH 16. MAEx4. Flexion to pain in all extremities. Ataxia in all extremities. Burden of exam on examiner.

## 2022-08-04 NOTE — ED Notes (Signed)
ED TO INPATIENT HANDOFF REPORT  ED Nurse Name and Phone #: Al Corpus, RN [phone]  S Name/Age/Gender Erik Larson 89 y.o. male Room/Bed: ED33A/ED33A  Code Status   Code Status: Full Code  Home/SNF/Other Nursing Home Disoriented x4   Is this baseline? No    Triage Complete: Triage complete  Chief Complaint CVA (cerebral vascular accident) [facility]) [I63.9]  Triage Note Pt presents to the ED via [facility]. Code stroke called in route. Upon arrival pt was met at the EMS door by this RN, Tresa Endo, RN (stroke coordinator), neurologist, and Marylene Land, Furniture conservator/restorer). Pt severe aphasia with last known well of 1000 today. Pt is from [facility] and per staff typically is ambulatory and is able to perform ADL's by self.   Allergies Allergies  Allergen Reactions   Ace Inhibitors    Penicillins Rash    Level of Care/Admitting Diagnosis ED Disposition     ED Disposition  Admit   Condition  --   Comment  Hospital Area: [facility] [[id number]]  Level of Care: Telemetry Medical [104]  Covid Evaluation: Confirmed COVID Negative  Diagnosis: CVA (cerebral vascular accident) [facility]) [[id number]]  Admitting Physician: Steele Berg  Attending Physician: Floydene Flock [phone]  Certification:: I certify this patient will need inpatient services for at least 2 midnights  Estimated Length of Stay: 2          B Medical/Surgery History Past Medical History:  Diagnosis Date   Arthritis    Atrial fibrillation (HCC)    controlled on coumadin   GERD (gastroesophageal reflux disease)    Glaucoma    Gout    Heart disease    Hyperlipidemia    Warfarin therapy started 07/29/2014   Past Surgical History:  Procedure Laterality Date   COLONOSCOPY  2005 ?   GLAUCOMA SURGERY     IR CATHETER TUBE CHANGE  06/30/2022     A IV Location/Drains/Wounds Patient Lines/Drains/Airways Status     Active Line/Drains/Airways     Name Placement date Placement time Site Days    Peripheral IV 08/03/22 20 G Left Antecubital 08/03/22  1300  Antecubital  1   Suprapubic Catheter 16 Fr. 06/30/22  1035  --  35            Intake/Output Last 24 hours  Intake/Output Summary (Last 24 hours) at 08/04/2022 0954 Last data filed at 08/04/2022 2130 Gross per 24 hour  Intake --  Output 800 ml  Net -800 ml    Labs/Imaging Results for orders placed or performed during the hospital encounter of 08/03/22 (from the past 48 hour(s))  Glucose, capillary     Status: None   Collection Time: 08/03/22  1:03 PM  Result Value Ref Range   Glucose-Capillary 97 70 - 99 mg/dL    Comment: Glucose reference range applies only to samples taken after fasting for at least 8 hours.  Protime-INR     Status: Abnormal   Collection Time: 08/03/22  1:07 PM  Result Value Ref Range   Prothrombin Time 22.5 (H) 11.4 - 15.2 seconds   INR 2.0 (H) 0.8 - 1.2    Comment: (NOTE) INR goal varies based on device and disease states. Performed at [facility], [address] [place], [place] [address]   APTT     Status: Abnormal   Collection Time: 08/03/22  1:07 PM  Result Value Ref Range   aPTT 37 (H) 24 - 36 seconds    Comment:  IF BASELINE aPTT IS ELEVATED, SUGGEST PATIENT RISK ASSESSMENT BE USED TO DETERMINE APPROPRIATE ANTICOAGULANT THERAPY. Performed at [facility], [address] [place], [place] [address]   CBC     Status: Abnormal   Collection Time: 08/03/22  1:07 PM  Result Value Ref Range   WBC 6.0 4.0 - 10.5 K/uL   RBC 3.71 (L) 4.22 - 5.81 MIL/uL   Hemoglobin 12.0 (L) 13.0 - 17.0 g/dL   HCT 60.4 (L) 54.0 - 98.1 %   MCV 96.0 80.0 - 100.0 fL   MCH 32.3 26.0 - 34.0 pg   MCHC 33.7 30.0 - 36.0 g/dL   RDW 19.1 47.8 - 29.5 %   Platelets 234 150 - 400 K/uL   nRBC 0.0 0.0 - 0.2 %    Comment: Performed at [facility], [address] [place], [place] [address]  Differential     Status: None   Collection Time: 08/03/22  1:07 PM  Result Value  Ref Range   Neutrophils Relative % 59 %   Neutro Abs 3.6 1.7 - 7.7 K/uL   Lymphocytes Relative 25 %   Lymphs Abs 1.5 0.7 - 4.0 K/uL   Monocytes Relative 10 %   Monocytes Absolute 0.6 0.1 - 1.0 K/uL   Eosinophils Relative 5 %   Eosinophils Absolute 0.3 0.0 - 0.5 K/uL   Basophils Relative 1 %   Basophils Absolute 0.1 0.0 - 0.1 K/uL   Immature Granulocytes 0 %   Abs Immature Granulocytes 0.02 0.00 - 0.07 K/uL    Comment: Performed at [facility], [address] [place], [place] [address]  Comprehensive metabolic panel     Status: None   Collection Time: 08/03/22  1:07 PM  Result Value Ref Range   Sodium 136 135 - 145 mmol/L   Potassium 4.4 3.5 - 5.1 mmol/L   Chloride 103 98 - 111 mmol/L   CO2 25 22 - 32 mmol/L   Glucose, Bld 99 70 - 99 mg/dL    Comment: Glucose reference range applies only to samples taken after fasting for at least 8 hours.   BUN 18 8 - 23 mg/dL   Creatinine, Ser 4.69 0.61 - 1.24 mg/dL   Calcium 9.0 8.9 - 62.9 mg/dL   Total Protein 7.7 6.5 - 8.1 g/dL   Albumin 3.9 3.5 - 5.0 g/dL   AST 22 15 - 41 U/L   ALT 15 0 - 44 U/L   Alkaline Phosphatase 86 38 - 126 U/L   Total Bilirubin 0.9 0.3 - 1.2 mg/dL   GFR, Estimated >52 >84 mL/min    Comment: (NOTE) Calculated using the CKD-EPI Creatinine Equation (2021)    Anion gap 8 5 - 15    Comment: Performed at [facility], [address] [place], [place] [address]  Ethanol     Status: None   Collection Time: 08/03/22  1:07 PM  Result Value Ref Range   Alcohol, Ethyl (B) <10 <10 mg/dL    Comment: (NOTE) Lowest detectable limit for serum alcohol is 10 mg/dL.  For medical purposes only. Performed at [facility], [address] [place], [place] [address]   Urinalysis, w/ Reflex to Culture (Infection Suspected) -Urine, Clean Catch     Status: Abnormal   Collection Time: 08/03/22  2:21 PM  Result Value Ref Range   Specimen Source URINE, CLEAN CATCH    Color, Urine YELLOW (A) YELLOW    APPearance CLEAR (A) CLEAR   Specific Gravity, Urine  1.023 1.005 - 1.030   pH 7.0 5.0 - 8.0   Glucose, UA NEGATIVE NEGATIVE mg/dL   Hgb urine dipstick NEGATIVE NEGATIVE   Bilirubin Urine NEGATIVE NEGATIVE   Ketones, ur NEGATIVE NEGATIVE mg/dL   Protein, ur NEGATIVE NEGATIVE mg/dL   Nitrite NEGATIVE NEGATIVE   Leukocytes,Ua SMALL (A) NEGATIVE   RBC / HPF 6-10 0 - 5 RBC/hpf   WBC, UA 11-20 0 - 5 WBC/hpf    Comment:        Reflex urine culture not performed if WBC <=10, OR if Squamous epithelial cells >5. If Squamous epithelial cells >5 suggest recollection.    Bacteria, UA RARE (A) NONE SEEN   Squamous Epithelial / HPF 0-5 0 - 5 /HPF   Mucus PRESENT     Comment: Performed at [facility], [address] [place], [place] [address]  Ammonia     Status: None   Collection Time: 08/03/22  4:30 PM  Result Value Ref Range   Ammonia <10 9 - 35 umol/L    Comment: Performed at [facility], [address] [place], [place] [address]  Lipid panel     Status: Abnormal   Collection Time: 08/04/22  5:21 AM  Result Value Ref Range   Cholesterol 155 0 - 200 mg/dL   Triglycerides 295 (H) <150 mg/dL   HDL 33 (L) >62 mg/dL   Total CHOL/HDL Ratio 4.7 RATIO   VLDL 33 0 - 40 mg/dL   LDL Cholesterol 89 0 - 99 mg/dL    Comment:        Total Cholesterol/HDL:CHD Risk Coronary Heart Disease Risk Table                     Men   Women  1/2 Average Risk   3.4   3.3  Average Risk       5.0   4.4  2 X Average Risk   9.6   7.1  3 X Average Risk  23.4   11.0        Use the calculated Patient Ratio above and the CHD Risk Table to determine the patient's CHD Risk.        ATP III CLASSIFICATION (LDL):  <100     mg/dL   Optimal  130-865  mg/dL   Near or Above                    Optimal  130-159  mg/dL   Borderline  784-696  mg/dL   High  >295     mg/dL   Very High Performed at [facility], [address] [place], [place] [address]   Protime-INR     Status: Abnormal    Collection Time: 08/04/22  5:21 AM  Result Value Ref Range   Prothrombin Time 20.2 (H) 11.4 - 15.2 seconds   INR 1.7 (H) 0.8 - 1.2    Comment: (NOTE) INR goal varies based on device and disease states. Performed at [facility], [address] [place], [place] [address]   CBG monitoring, ED     Status: Abnormal   Collection Time: 08/04/22  9:10 AM  Result Value Ref Range   Glucose-Capillary 120 (H) 70 - 99 mg/dL    Comment: Glucose reference range applies only to samples taken after fasting for at least 8 hours.   MR BRAIN WO CONTRAST  Result Date: 08/03/2022 CLINICAL DATA:  Neuro deficit, acute, stroke suspected aphasia EXAM: MRI HEAD  WITHOUT CONTRAST TECHNIQUE: Multiplanar, multiecho pulse sequences of the brain and surrounding structures were obtained without intravenous contrast. COMPARISON:  Same day CT head. FINDINGS: Brain: No acute infarction, acute hemorrhage, hydrocephalus, extra-axial collection or mass lesion. Remote hemorrhagic infarct in the left basal ganglia. Additional scattered T2/FLAIR hyperintensities in the white matter are compatible with chronic microvascular ischemic disease. Cerebral atrophy. Cavum septum pellucidum et vergae, anatomic variant. Vascular: Major arterial flow voids are maintained at the skull base. Skull and upper cervical spine: Normal marrow signal. Sinuses/Orbits: Clear sinuses.  No acute orbital findings. Other: No mastoid effusions. IMPRESSION: 1. No evidence of acute intracranial abnormality. 2. Remote left basal ganglia infarct. 3.  Cerebral Atrophy (ICD10-G31.9). Electronically Signed   By: Feliberto Harts M.D.   On: 08/03/2022 15:26   CT ANGIO HEAD NECK W WO CM (CODE STROKE)  Result Date: 08/03/2022 CLINICAL DATA:  Neuro deficit, acute, stroke suspected EXAM: CT ANGIOGRAPHY HEAD AND NECK WITH AND WITHOUT CONTRAST TECHNIQUE: Multidetector CT imaging of the head and neck was performed using the standard protocol during bolus  administration of intravenous contrast. Multiplanar CT image reconstructions and MIPs were obtained to evaluate the vascular anatomy. Carotid stenosis measurements (when applicable) are obtained utilizing NASCET criteria, using the distal internal carotid diameter as the denominator. RADIATION DOSE REDUCTION: This exam was performed according to the departmental dose-optimization program which includes automated exposure control, adjustment of the mA and/or kV according to patient size and/or use of iterative reconstruction technique. CONTRAST:  50mL OMNIPAQUE IOHEXOL 350 MG/ML SOLN COMPARISON:  Same day CT head FINDINGS: CT HEAD FINDINGS See same day CT brain for intracranial findings. No intracranial contrast-enhancing lesion. CTA NECK FINDINGS Aortic arch: Standard branching. Imaged portion shows no evidence of aneurysm or dissection. No significant stenosis of the major arch vessel origins. Right carotid system: No evidence of dissection, stenosis (50% or greater), or occlusion. Mild (less than 50%) narrowing of the origin of the right ICA secondary to mixed calcified and noncalcified atherosclerotic plaque. Left carotid system: No evidence of dissection, stenosis (50% or greater), or occlusion. Vertebral arteries: Right vertebral artery terminates in a PICA. No evidence of dissection, stenosis (50% or greater), or occlusion. Skeleton: Multilevel degenerative changes in the cervical spine. Other neck: Negative. Upper chest: Negative. Review of the MIP images confirms the above findings CTA HEAD FINDINGS Anterior circulation: No significant stenosis, proximal occlusion, aneurysm, or vascular malformation. Posterior circulation: No significant stenosis, proximal occlusion, aneurysm, or vascular malformation. Venous sinuses: As permitted by contrast timing, patent. Anatomic variants: Fetal PCAs bilaterally. Review of the MIP images confirms the above findings IMPRESSION: No intracranial large vessel occlusion or  hemodynamically significant stenosis in the neck. Electronically Signed   By: Lorenza Cambridge M.D.   On: 08/03/2022 13:37   CT HEAD CODE STROKE WO CONTRAST  Result Date: 08/03/2022 CLINICAL DATA:  Code stroke. Neuro deficit, acute, stroke suspected. Aphasia. EXAM: CT HEAD WITHOUT CONTRAST TECHNIQUE: Contiguous axial images were obtained from the base of the skull through the vertex without intravenous contrast. RADIATION DOSE REDUCTION: This exam was performed according to the departmental dose-optimization program which includes automated exposure control, adjustment of the mA and/or kV according to patient size and/or use of iterative reconstruction technique. COMPARISON:  Prior head CT examinations 06/11/2022 and earlier. FINDINGS: Brain: Generalized parenchymal atrophy. Redemonstrated chronic infarct within the left basal ganglia/subinsular white matter. Background mild patchy and ill-defined hypoattenuation within the cerebral white matter, nonspecific but compatible with chronic small ischemic disease. Cavum septum pellucidum and cavum  vergae (anatomic variant). There is no acute intracranial hemorrhage. No demarcated cortical infarct. No extra-axial fluid collection. No evidence of an intracranial mass. No midline shift. Vascular: No hyperdense vessel.  Atherosclerotic calcifications. Skull: No fracture or aggressive osseous lesion. Sinuses/Orbits: No mass or acute finding within the imaged orbits. Mild mucosal thickening within the left maxillary sinus at the imaged levels. ASPECTS [facility] Stroke Program Early CT Score) - Ganglionic level infarction (caudate, lentiform nuclei, internal capsule, insula, M1-M3 cortex): 7 - Supraganglionic infarction (M4-M6 cortex): 3 Total score (0-10 with 10 being normal): 10 (when discounting the chronic infarct). No evidence of an acute intracranial abnormality. These results were communicated to Dr. Amada Jupiter at 1:21 pmon 5/22/2024by text page via the [facility] messaging  system. IMPRESSION: 1. No evidence of an acute intracranial abnormality. 2. Redemonstrated chronic infarct within the left basal ganglia/subinsular white matter. 3. Background mild cerebral white matter chronic small vessel disease. 4. Generalized parenchymal atrophy. Electronically Signed   By: Jackey Loge D.O.   On: 08/03/2022 13:21    Pending Labs Unresulted Labs (From admission, onward)     Start     Ordered   08/05/22 0500  Basic metabolic panel  Daily,   R      08/04/22 0853   08/05/22 0500  CBC  Daily,   R      08/04/22 0853   08/05/22 0500  Magnesium  Daily,   R      08/04/22 0853   08/05/22 0500  Phosphorus  Daily,   R      08/04/22 0853   08/04/22 0854  Phosphorus  Add-on,   AD       Question:  Specimen collection method  Answer:  Lab=Lab collect   08/04/22 0853   08/04/22 0854  Magnesium  Add-on,   AD       Question:  Specimen collection method  Answer:  Lab=Lab collect   08/04/22 0853   08/04/22 0853  Vitamin B12  Add-on,   AD        08/04/22 0852   08/04/22 0853  Iron and TIBC  Add-on,   AD        08/04/22 0852   08/04/22 0853  Folate  Add-on,   AD        08/04/22 0852   08/04/22 0853  VITAMIN D 25 Hydroxy (Vit-D Deficiency, Fractures)  Add-on,   AD        08/04/22 0852   08/04/22 0852  TSH  Add-on,   AD        08/04/22 0851   08/04/22 0500  Protime-INR  Daily,   R      08/03/22 1627   08/03/22 1513  Hemoglobin A1c  (Labs)  Once,   URGENT       Comments: To assess prior glycemic control    08/03/22 1527   08/03/22 1421  Urine Culture  Once,   R        08/03/22 1421            Vitals/Pain Today's Vitals   08/04/22 0815 08/04/22 0830 08/04/22 0845 08/04/22 0900  BP:      Pulse: 75 96 77 89  Resp:      Temp:      TempSrc:      SpO2: 99% 94% 95% 99%  Weight:        Isolation Precautions No active isolations  Medications Medications   stroke: early stages of recovery book (has no administration in time  range)  Warfarin - Pharmacist Dosing  Inpatient (has no administration in time range)  labetalol (NORMODYNE) injection 10 mg (has no administration in time range)  risperiDONE (RISPERDAL) tablet 0.5 mg (0.5 mg Oral Not Given 08/03/22 2114)  finasteride (PROSCAR) tablet 5 mg (has no administration in time range)  allopurinol (ZYLOPRIM) tablet 100 mg (has no administration in time range)  metoprolol tartrate (LOPRESSOR) injection 5 mg (has no administration in time range)  warfarin (COUMADIN) tablet 6 mg (has no administration in time range)  acetaminophen (TYLENOL) tablet 650 mg (has no administration in time range)  ondansetron (ZOFRAN) injection 4 mg (has no administration in time range)  dextrose 50 % solution 12.5 g (has no administration in time range)  sodium chloride flush (NS) 0.9 % injection 3 mL (3 mLs Intravenous Given 08/03/22 1336)  iohexol (OMNIPAQUE) 350 MG/ML injection 50 mL (50 mLs Intravenous Contrast Given 08/03/22 1322)  LORazepam (ATIVAN) injection 1 mg (1 mg Intravenous Given 08/03/22 1456)    Mobility walks with device     Focused Assessments Neuro Assessment Handoff:  Swallow screen pass? Yes  Cardiac Rhythm: Atrial fibrillation NIH Stroke Scale  Dizziness Present: No Headache Present: No Interval: Shift assessment Level of Consciousness (1a.)   : Alert, keenly responsive LOC Questions (1b. )   : Answers neither question correctly LOC Commands (1c. )   : Performs neither task correctly Best Gaze (2. )  : Normal Visual (3. )  : Complete hemianopia Facial Palsy (4. )    : Normal symmetrical movements Motor Arm, Left (5a. )   : Drift Motor Arm, Right (5b. ) : Drift Motor Leg, Left (6a. )  : Drift Motor Leg, Right (6b. ) : Drift Limb Ataxia (7. ): Present in two limbs Sensory (8. )  : Normal, no sensory loss Best Language (9. )  : Severe aphasia Dysarthria (10. ): Mild-to-moderate dysarthria, patient slurs at least some words and, at worst, can be understood with some  difficulty Extinction/Inattention (11.)   : No Abnormality Complete NIHSS TOTAL: 15 Last date known well: 08/03/22 Last time known well: 1000 Neuro Assessment: Exceptions to WDL Neuro Checks:   Initial (08/03/22 1306)  Has TPA been given? No If patient is a Neuro Trauma and patient is going to OR before floor call report to 4N Charge nurse: [phone] or [phone]   R Recommendations: See Admitting Provider Note  Report given to:   Additional Notes: See recent PT/OT, admitting, SLP notes

## 2022-08-04 NOTE — Progress Notes (Signed)
[facility] Progress Note  Patient: Erik Larson    RUE:[id number]  DOA: 08/03/2022     Date of Service: the patient was seen and examined on 08/04/2022  Chief Complaint  Patient presents with   Code Stroke   Brief hospital course:  [name] is a 89 y.o. male with medical history significant of atrial fibrillation on Coumadin, GERD, hyperlipidemia, BPH/urinary retention status post suprapubic catheter, hypertension presenting with encephalopathy and suspected CVA.  Limited history in the setting of encephalopathy.  Patient confused and answering minimal questions.  Per report, patient with an acute change in mentation since earlier today.  Patient was noted to be acutely confused by his daughter around 72 AM.  Patient lives at a local assisted living facility.  Per report, patient usually conversive at baseline.  Had acute confusion as well as aphasia per report.  No reports of fevers or chills.  No reports of chest pain shortness of breath nausea vomiting.  Noted admission March 30 through April 2 for similar issues associated with UTI.  Had urine culture that showed Enterococcus faecalis and Corynebacterium species.  Completed course of IV ceftazidime as well as oral nitrofurantoin based on urine sensitivities.  No reports of recent change in urine pattern.  No reported abdominal pain or diarrhea. Presented to the ER afebrile, pressures 170s over 100s, satting well on room air.  White count 6.0, hemoglobin 12, platelets 234, urinalysis grossly stable apart from small leukocytes and rare bacteria, INR 2, creatinine 1.05.  Code stroke CT head as well as CTA head and neck grossly stable.  MRI of the brain pending.  Not a TNK candidate given elevated INR.   Assessment and Plan:  Acute metabolic encephalopathy CVA ruled out, CT scan and MRI negative for any acute findings Continue supportive care and fall precautions, continue aspiration precautions PT and OT eval, SLP eval Ambulate with  assistance Neurology following, recommended EEG   Presumed UTI, UA not very impressive Patient has chronic suprapubic Foley catheter which was exchanged on 06/30/2022 RN was advised to contact ICU RN to exchange Foley catheter IR recommended to consult urology, and urology recommended to contact ICU RN Follow urine culture Empirically started ceftriaxone 1 g IV daily  Chronic persistent A-fib, HTN Continue to monitor on telemetry Continue Coumadin, pharmacy consulted for INR monitoring Started Lopressor 12.5 mg p.o. twice daily Use IV Lopressor as needed  History of gout, continued allopurinol BPH, continue Proscar   Body mass index is 29.29 kg/m.  Interventions:       Diet: Dysphagia 3 diet DVT Prophylaxis: Therapeutic Anticoagulation with Coumadin    Advance goals of care discussion: Full code  Family Communication: family was not present at bedside, at the time of interview.  Patient was obtunded, AAO x 0, altered mental status.  Disposition:  Pt is from ALF, admitted with AMS, still has AMS, which precludes a safe discharge. Discharge to SNF TBD after PT/OT eval, when clinically stable..  Subjective: No significant events overnight, patient was completely obtunded, AO x 0 unable to respond and follow commands.  Patient opened his eyes on light touch and dozed off.  Unable to offer any complaints, seems to be resting comfortably.  Physical Exam: General: NAD, lying comfortably, obtunded and dozing off Appear in no distress, in deep sleep Eyes: PERRLA ENT: Oral Mucosa Clear, moist  Neck: no JVD,  Cardiovascular: S1 and S2 Present, no Murmur,  Respiratory: good respiratory effort, Bilateral Air entry equal and Decreased, no Crackles,  no wheezes Abdomen: Bowel Sound present, Soft and no tenderness,  Skin: no rashes Extremities: no Pedal edema, no calf tenderness Neurologic: without any new focal findings Gait not checked due to patient safety concerns  Vitals:    08/04/22 0830 08/04/22 0845 08/04/22 0900 08/04/22 1023  BP:    128/66  Pulse: 96 77 89 71  Resp:    18  Temp:    98.2 F (36.8 C)  TempSrc:    Oral  SpO2: 94% 95% 99% 98%  Weight:        Intake/Output Summary (Last 24 hours) at 08/04/2022 1215 Last data filed at 08/04/2022 1610 Gross per 24 hour  Intake --  Output 800 ml  Net -800 ml   Filed Weights   08/03/22 1335  Weight: 84.8 kg    Data Reviewed: I have personally reviewed and interpreted daily labs, tele strips, imagings as discussed above. I reviewed all nursing notes, pharmacy notes, vitals, pertinent old records I have discussed plan of care as described above with RN and patient/family.  CBC: Recent Labs  Lab 08/03/22 1307  WBC 6.0  NEUTROABS 3.6  HGB 12.0*  HCT 35.6*  MCV 96.0  PLT 234   Basic Metabolic Panel: Recent Labs  Lab 08/03/22 1307  NA 136  K 4.4  CL 103  CO2 25  GLUCOSE 99  BUN 18  CREATININE 1.05  CALCIUM 9.0    Studies: MR BRAIN WO CONTRAST  Result Date: 08/03/2022 CLINICAL DATA:  Neuro deficit, acute, stroke suspected aphasia EXAM: MRI HEAD WITHOUT CONTRAST TECHNIQUE: Multiplanar, multiecho pulse sequences of the brain and surrounding structures were obtained without intravenous contrast. COMPARISON:  Same day CT head. FINDINGS: Brain: No acute infarction, acute hemorrhage, hydrocephalus, extra-axial collection or mass lesion. Remote hemorrhagic infarct in the left basal ganglia. Additional scattered T2/FLAIR hyperintensities in the white matter are compatible with chronic microvascular ischemic disease. Cerebral atrophy. Cavum septum pellucidum et vergae, anatomic variant. Vascular: Major arterial flow voids are maintained at the skull base. Skull and upper cervical spine: Normal marrow signal. Sinuses/Orbits: Clear sinuses.  No acute orbital findings. Other: No mastoid effusions. IMPRESSION: 1. No evidence of acute intracranial abnormality. 2. Remote left basal ganglia infarct. 3.   Cerebral Atrophy (ICD10-G31.9). Electronically Signed   By: Feliberto Harts M.D.   On: 08/03/2022 15:26   CT ANGIO HEAD NECK W WO CM (CODE STROKE)  Result Date: 08/03/2022 CLINICAL DATA:  Neuro deficit, acute, stroke suspected EXAM: CT ANGIOGRAPHY HEAD AND NECK WITH AND WITHOUT CONTRAST TECHNIQUE: Multidetector CT imaging of the head and neck was performed using the standard protocol during bolus administration of intravenous contrast. Multiplanar CT image reconstructions and MIPs were obtained to evaluate the vascular anatomy. Carotid stenosis measurements (when applicable) are obtained utilizing NASCET criteria, using the distal internal carotid diameter as the denominator. RADIATION DOSE REDUCTION: This exam was performed according to the departmental dose-optimization program which includes automated exposure control, adjustment of the mA and/or kV according to patient size and/or use of iterative reconstruction technique. CONTRAST:  50mL OMNIPAQUE IOHEXOL 350 MG/ML SOLN COMPARISON:  Same day CT head FINDINGS: CT HEAD FINDINGS See same day CT brain for intracranial findings. No intracranial contrast-enhancing lesion. CTA NECK FINDINGS Aortic arch: Standard branching. Imaged portion shows no evidence of aneurysm or dissection. No significant stenosis of the major arch vessel origins. Right carotid system: No evidence of dissection, stenosis (50% or greater), or occlusion. Mild (less than 50%) narrowing of the origin of the right ICA secondary  to mixed calcified and noncalcified atherosclerotic plaque. Left carotid system: No evidence of dissection, stenosis (50% or greater), or occlusion. Vertebral arteries: Right vertebral artery terminates in a PICA. No evidence of dissection, stenosis (50% or greater), or occlusion. Skeleton: Multilevel degenerative changes in the cervical spine. Other neck: Negative. Upper chest: Negative. Review of the MIP images confirms the above findings CTA HEAD FINDINGS Anterior  circulation: No significant stenosis, proximal occlusion, aneurysm, or vascular malformation. Posterior circulation: No significant stenosis, proximal occlusion, aneurysm, or vascular malformation. Venous sinuses: As permitted by contrast timing, patent. Anatomic variants: Fetal PCAs bilaterally. Review of the MIP images confirms the above findings IMPRESSION: No intracranial large vessel occlusion or hemodynamically significant stenosis in the neck. Electronically Signed   By: Lorenza Cambridge M.D.   On: 08/03/2022 13:37   CT HEAD CODE STROKE WO CONTRAST  Result Date: 08/03/2022 CLINICAL DATA:  Code stroke. Neuro deficit, acute, stroke suspected. Aphasia. EXAM: CT HEAD WITHOUT CONTRAST TECHNIQUE: Contiguous axial images were obtained from the base of the skull through the vertex without intravenous contrast. RADIATION DOSE REDUCTION: This exam was performed according to the departmental dose-optimization program which includes automated exposure control, adjustment of the mA and/or kV according to patient size and/or use of iterative reconstruction technique. COMPARISON:  Prior head CT examinations 06/11/2022 and earlier. FINDINGS: Brain: Generalized parenchymal atrophy. Redemonstrated chronic infarct within the left basal ganglia/subinsular white matter. Background mild patchy and ill-defined hypoattenuation within the cerebral white matter, nonspecific but compatible with chronic small ischemic disease. Cavum septum pellucidum and cavum vergae (anatomic variant). There is no acute intracranial hemorrhage. No demarcated cortical infarct. No extra-axial fluid collection. No evidence of an intracranial mass. No midline shift. Vascular: No hyperdense vessel.  Atherosclerotic calcifications. Skull: No fracture or aggressive osseous lesion. Sinuses/Orbits: No mass or acute finding within the imaged orbits. Mild mucosal thickening within the left maxillary sinus at the imaged levels. ASPECTS [facility] Stroke Program  Early CT Score) - Ganglionic level infarction (caudate, lentiform nuclei, internal capsule, insula, M1-M3 cortex): 7 - Supraganglionic infarction (M4-M6 cortex): 3 Total score (0-10 with 10 being normal): 10 (when discounting the chronic infarct). No evidence of an acute intracranial abnormality. These results were communicated to Dr. Amada Jupiter at 1:21 pmon 5/22/2024by text page via the [facility] messaging system. IMPRESSION: 1. No evidence of an acute intracranial abnormality. 2. Redemonstrated chronic infarct within the left basal ganglia/subinsular white matter. 3. Background mild cerebral white matter chronic small vessel disease. 4. Generalized parenchymal atrophy. Electronically Signed   By: Jackey Loge D.O.   On: 08/03/2022 13:21    Scheduled Meds:   stroke: early stages of recovery book   Does not apply Once   allopurinol  100 mg Oral Daily   dextrose  12.5 g Intravenous STAT   finasteride  5 mg Oral Daily   risperiDONE  0.5 mg Oral QHS   warfarin  6 mg Oral ONCE-1600   Warfarin - Pharmacist Dosing Inpatient   Does not apply q1600   Continuous Infusions:  cefTRIAXone (ROCEPHIN)  IV     PRN Meds: acetaminophen, labetalol, metoprolol tartrate, ondansetron (ZOFRAN) IV  Time spent: 55 minutes  Author: Gillis Santa. MD [facility] 08/04/2022 12:15 PM  To reach On-call, see care teams to locate the attending and reach out to them via [email or web address]. If 7PM-7AM, please contact night-coverage If you still have difficulty reaching the attending provider, please page the [facility] (Director on Call) for [facility] on amion for assistance.

## 2022-08-04 NOTE — ED Notes (Signed)
Per SLP: he looks OK for a mech soft diet with thin liquids - meds crushed - assistance with feeding due to vision. We will check back on him tomorrow.

## 2022-08-04 NOTE — ED Notes (Addendum)
PT/OT finished. Wife at [facility]. Pt back sleeping in bed. CBG 120. SLP into room for cognitive and swallow eval. Wife verbalizes "his biggest obstacle is his vision. Wears dentures and partial".

## 2022-08-04 NOTE — Consult Note (Signed)
Consultation Note Date: 08/04/2022   Patient Name: Erik Larson  DOB: October 25, 1924  MRN: [id number]  Age / Sex: 89 y.o., male  PCP: Erik Flock, MD Referring Physician: Gillis Santa, MD  Reason for Consultation: Establishing goals of care  HPI/Patient Profile: 89 y.o. male  with past medical history of Afib/Coumadin, GERD, HTN/HLD, BPH/urinary retent sp suprapubic, [facility], assist w/ADLs  admitted on 08/03/2022 with acute metabolic encephalopathy, CVA ruled out.  Presumed UTI, UA not impressive but patient has chronic suprapubic catheter changed 4/18, following UA culture.   Clinical Assessment and Goals of Care: I have reviewed medical records including EPIC notes, labs and imaging, received report from RN, virtual consult.  Call to daughter, Erik Larson to discuss diagnosis prognosis, GOC, EOL wishes, disposition and options. I introduced Palliative Medicine as specialized medical care for people living with serious illness. It focuses on providing relief from the symptoms and stress of a serious illness. The goal is to improve quality of life for both the patient and the family.  We discussed a brief life review of the patient.  Daughter Erik Larson shares that Erik Larson is a widower, his wife died in 04-Sep-2018.  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  He has 2 daughters Erik Larson and Erik Larson.  He was having frequent urinary tract infections and could no longer stay at home, moved into [facility] at the lowest level of care July 2023.  He has great support from family there, able to walk with his walker to the dining room 3 times per day.  He does have some assistance with suprapubic catheter needs.  His grandson Erik Larson goes most every evening and daughter Erik Larson goes most days.  We then focused on their current illness.  Cannot talked in detail about Mr.  Larson acute and chronic illnesses.  We talked about the treatment plan including, but not limited to, urology consult; neurology consult testing and recommendations; lab results and lab draws for the next few days, awaiting urine culture; medication treatment including, but not limited to IV antibiotics.    We talk about physical therapy evaluation.  She shares that her father is very happy at [facility] and did very poorly when he was at skilled nursing facility.  She tells me that they would not want him transferred to skilled nursing, but would prefer he return to [facility] if possible, with home health/PT services there.  The natural disease trajectory and expectations at EOL were discussed.  Advanced directives, concepts specific to code status, artifical feeding and hydration, and rehospitalization were considered and discussed.  We talk about the concept of "treat the treatable, but allow a natural passing"/DNR.  Erik Larson states that Erik Larson would not want to be kept alive on machines.  She and I talk about continuing to treat, giving medications, doing testing, but "do not try resuscitation".  She is agreeable.  Orders changed.  Palliative Care services outpatient were explained and offered.  We  talk about the benefits of out patient palliative services. At this point, Erik Larson is open to outpatient palliative services. Provider choices offered.  [facility] is provider of choice.   Discussed the importance of continued conversation with family and the medical providers regarding overall plan of care and treatment options, ensuring decisions are within the context of the patient's values and GOCs.  Questions and concerns were addressed.  The family was encouraged to call with questions or concerns.  PMT will continue to support holistically.  Conference with attending, bedside nursing staff, transition of care team related to patient condition, needs, goals of care, disposition.   HCPOA  NEXT OF KIN  -daughter, Erik Larson is main contact.  She tells me that she leans on [name] granddaughter in law, Erik Larson for guidance with some medical concerns.  Daughter Erik Larson is still working and has health issues.    SUMMARY OF RECOMMENDATIONS   At this point continue to treat the treatable but no CPR or intubation Time for outcomes Return to [facility] with home health/PT services Agreeable to outpatient palliative services, provider choice offered, [facility]   Code Status/Advance Care Planning: DNR  Symptom Management:  Per hospitalist, no additional needs at this time.  Palliative Prophylaxis:  Frequent Pain Assessment and Oral Care  Additional Recommendations (Limitations, Scope, Preferences): Continue to treat the treatable but no CPR or intubation  Psycho-social/Spiritual:  Desire for further Chaplaincy support:no Additional Recommendations: Caregiving  Support/Resources  Prognosis:  Unable to determine, based on outcomes.  Discharge Planning: To be determined, has qualified for short-term rehab with the ultimate goal of returning to ALF.  Would benefit from outpatient palliative services.       Primary Diagnoses: Present on Admission:  CVA (cerebral vascular accident) (HCC)  Atrial fibrillation, controlled (HCC)  Benign prostatic hyperplasia with lower urinary tract symptoms  Acid reflux  Essential hypertension  Acute metabolic encephalopathy   I have reviewed the medical record, interviewed the patient and family, and examined the patient. The following aspects are pertinent.  Past Medical History:  Diagnosis Date   Arthritis    Atrial fibrillation (HCC)    controlled on coumadin   GERD (gastroesophageal reflux disease)    Glaucoma    Gout    Heart disease    Hyperlipidemia    Warfarin therapy started 07/29/2014   Social History   Socioeconomic History   Marital status: Married    Spouse name: Research scientist (physical sciences)   Number of children: 2   Years of  education: Not on file   Highest education level: 10th grade  Occupational History   Occupation: Retired  Tobacco Use   Smoking status: Former    Types: Cigars    Quit date: 1970    Years since quitting: 54.4   Smokeless tobacco: Never   Tobacco comments:    smoking cessation materials not required  Vaping Use   Vaping Use: Never used  Substance and Sexual Activity   Alcohol use: Not Currently    Alcohol/week: 0.0 standard drinks of alcohol   Drug use: No   Sexual activity: Not Currently  Other Topics Concern   Not on file  Social History Narrative   Not on file   Social Determinants of Health   Financial Resource Strain: Low Risk  (07/12/2017)   Overall Financial Resource Strain (CARDIA)    Difficulty of Paying Living Expenses: Not hard at all  Food Insecurity: No Food Insecurity (06/12/2022)   Hunger Vital Sign    Worried  About Running Out of Food in the Last Year: Never true    Ran Out of Food in the Last Year: Never true  Transportation Needs: No Transportation Needs (06/12/2022)   PRAPARE - Administrator, Civil Service (Medical): No    Lack of Transportation (Non-Medical): No  Physical Activity: Inactive (07/12/2017)   Exercise Vital Sign    Days of Exercise per Week: 0 days    Minutes of Exercise per Session: 0 min  Stress: No Stress Concern Present (07/12/2017)   [employer] of Occupational Health - Occupational Stress Questionnaire    Feeling of Stress : Not at all  Social Connections: Unknown (07/12/2017)   Social Connection and Isolation Panel [NHANES]    Frequency of Communication with Friends and Family: Patient declined    Frequency of Social Gatherings with Friends and Family: Patient declined    Attends Religious Services: Patient declined    Database administrator or Organizations: Patient declined    Attends Engineer, structural: Patient declined    Marital Status: Married   Family History  Problem Relation Age of Onset   Cancer  Maternal Uncle    Alzheimer's disease Mother    Healthy Father    Healthy Sister    Healthy Sister    Healthy Sister    Heart disease Brother    Scheduled Meds:   stroke: early stages of recovery book   Does not apply Once   allopurinol  100 mg Oral Daily   dextrose  12.5 g Intravenous STAT   feeding supplement  237 mL Oral TID BM   finasteride  5 mg Oral Daily   metoprolol tartrate  12.5 mg Oral BID   multivitamin with minerals  1 tablet Oral Daily   risperiDONE  0.5 mg Oral QHS   warfarin  6 mg Oral ONCE-1600   Warfarin - Pharmacist Dosing Inpatient   Does not apply q1600   Continuous Infusions:  cefTRIAXone (ROCEPHIN)  IV     PRN Meds:.acetaminophen, metoprolol tartrate, ondansetron (ZOFRAN) IV Medications Prior to Admission:  Prior to Admission medications   Medication Sig Start Date End Date Taking? Authorizing Provider  acetaminophen (TYLENOL) 500 MG tablet Take 500 mg by mouth every 6 (six) hours as needed for mild pain or moderate pain.   Yes Smiley Houseman, NP  allopurinol (ZYLOPRIM) 100 MG tablet Take 100 mg by mouth daily. 11/08/21  Yes [provider]  BACTRIM 400-80 MG tablet Take 1 tablet by mouth daily. 06/01/22  Yes Smiley Houseman, NP  Cholecalciferol 50 MCG (2000 UT) TABS Take 2,000 Units by mouth daily.   Yes Smiley Houseman, NP  Cranberry 450 MG TABS Take 450 mg by mouth daily.   Yes Smiley Houseman, NP  dorzolamide-timolol (COSOPT) 2-0.5 % ophthalmic solution Place 1 drop into both eyes 2 (two) times daily.   Yes [provider]  finasteride (PROSCAR) 5 MG tablet Take 1 tablet (5 mg total) by mouth daily. 07/07/17  Yes Duanne Limerick, MD  latanoprost (XALATAN) 0.005 % ophthalmic solution Place 1 drop into both eyes at bedtime. Dr Beverely Pace   Yes   melatonin 3 MG TABS tablet Take 3 mg by mouth at bedtime as needed.   Yes [provider]  mupirocin ointment (BACTROBAN) 2 % Apply 1 Application topically 2 (two) times daily.   Yes [provider]  nitrofurantoin (MACRODANTIN) 100 MG capsule Take 100 mg by mouth daily. 07/31/22  Yes [provider]  phenazopyridine (PYRIDIUM) 95 MG tablet Take 95 mg by mouth 3 (three) times daily as needed (urinary spasms).   Yes Smiley Houseman, NP  psyllium (REGULOID) 0.52 g capsule Take 0.52 g by mouth at bedtime.   Yes [provider]  risperiDONE (RISPERDAL) 0.5 MG tablet Take 0.5 mg by mouth at bedtime. 09/18/21  Yes [provider]  sennosides-docusate sodium (SENOKOT-S) 8.6-50 MG tablet Take one tablet by mouth at bedtime on Mondays, Wednesdays, and Fridays.   Yes Slade-Hartman, Alvino Chapel, MD  tamsulosin (FLOMAX) 0.4 MG CAPS capsule Take 0.4 mg by mouth daily. 08/05/21  Yes [provider]  warfarin (COUMADIN) 5 MG tablet Take 5 mg by mouth daily.   Yes Smiley Houseman, NP   Allergies  Allergen Reactions   Ace Inhibitors    Penicillins Rash   Review of Systems  Unable to perform ROS: Age    Physical Exam Vitals and nursing note reviewed.     Vital Signs: BP 128/66 (BP Location: Right Arm)   Pulse 71   Temp 98.2 F (36.8 C) (Oral)   Resp 18   Wt 84.8 kg   SpO2 98%   BMI 29.29 kg/m  Pain Scale: PAINAD       SpO2: SpO2: 98 % O2 Device:SpO2: 98 % O2 Flow Rate: .   IO: Intake/output summary:  Intake/Output Summary (Last 24 hours) at 08/04/2022 1442 Last data filed at 08/04/2022 1610 Larson per 24 hour  Intake --  Output 800 ml  Net -800 ml    LBM:   Baseline Weight: Weight: 84.8 kg Most recent weight: Weight: 84.8 kg     Palliative Assessment/Data:     Time In: 1410 Time Out: 1525 Time Total: 75 minutes  Greater than 50%  of this time was spent counseling and coordinating care related to the above assessment and plan.  Signed by: Katheran Awe, NP   Please contact Palliative Medicine Team phone at [phone] for questions and concerns.  For individual provider: See Loretha Stapler

## 2022-08-04 NOTE — Evaluation (Signed)
Occupational Therapy Evaluation Patient Details Name: Erik Larson MRN: [id number] DOB: [birth year] Today's Date: 08/04/2022   History of Present Illness Erik Larson is a 89 y.o. male with medical history significant of atrial fibrillation on Coumadin, GERD, hyperlipidemia, BPH/urinary retention status post suprapubic catheter, hypertension presenting with encephalopathy and suspected CVA.MRI negative for acute abnormality but showed Remote left basal ganglia infarct.   Clinical Impression   Pt was seen for OT/PT co-evaluation this date. Prior to hospital admission, pt resided at an ALF and received assistance from staff for feeding, dressing, bathing, and functional mobility. Supportive dtr at bedside states pt has significant visual and hearing deficits at baseline but is generally oriented to himself and place. Pt presents to acute OT demonstrating impaired ADL performance and functional mobility 2/2 decreased cognition, generalized weakness, decreased activity tolerance, and impaired balance (See OT problem list). Pt currently requires MAX A for LB ADL management, MIN A +2 for STS and side stepping at EOB.  Pt would benefit from skilled OT services to address noted impairments and functional limitations (see below for any additional details) in order to maximize safety and independence while minimizing falls risk and caregiver burden.       Recommendations for follow up therapy are one component of a multi-disciplinary discharge planning process, led by the attending physician.  Recommendations may be updated based on patient status, additional functional criteria and insurance authorization.   Assistance Recommended at Discharge Frequent or constant Supervision/Assistance  Patient can return home with the following A little help with walking and/or transfers;A lot of help with bathing/dressing/bathroom;Assistance with cooking/housework;Assistance with feeding;Assist for transportation;Help  with stairs or ramp for entrance    Functional Status Assessment  Patient has had a recent decline in their functional status and demonstrates the ability to make significant improvements in function in a reasonable and predictable amount of time.  Equipment Recommendations  None recommended by OT    Recommendations for Other Services       Precautions / Restrictions Precautions Precautions: Fall Restrictions Weight Bearing Restrictions: No      Mobility Bed Mobility Overal bed mobility: Needs Assistance Bed Mobility: Supine to Sit, Sit to Supine     Supine to sit: Supervision, Min guard, HOB elevated Sit to supine: Max assist, HOB elevated        Transfers Overall transfer level: Needs assistance Equipment used: Rolling walker (2 wheels) Transfers: Sit to/from Stand Sit to Stand: Min assist, +2 physical assistance, From elevated surface                  Balance Overall balance assessment: Needs assistance Sitting-balance support: Feet supported, No upper extremity supported Sitting balance-Leahy Scale: Good Sitting balance - Comments: steady static sitting at EOB.   Standing balance support: Reliant on assistive device for balance, Bilateral upper extremity supported Standing balance-Leahy Scale: Fair                             ADL either performed or assessed with clinical judgement   ADL Overall ADL's : Needs assistance/impaired                                       General ADL Comments: Functionally limited by generalized weakness, decreased activity tolerance, decreased cognition, and impaired balance. He requires MIN A +2 for STS and to take small steps  at EOB. MAX A to don bilat socks at bed level, SET UP/SUP for seated UB ADL management.     Vision         Perception     Praxis      Pertinent Vitals/Pain Pain Assessment Pain Assessment: Faces Faces Pain Scale: Hurts a little bit Pain Location:  Headache Pain Descriptors / Indicators: Aching, Sore Pain Intervention(s): Limited activity within patient's tolerance, Repositioned     Hand Dominance Right   Extremity/Trunk Assessment Upper Extremity Assessment Upper Extremity Assessment: Generalized weakness   Lower Extremity Assessment Lower Extremity Assessment: Generalized weakness       Communication Communication Communication: No difficulties;HOH   Cognition Arousal/Alertness: Lethargic Behavior During Therapy: WFL for tasks assessed/performed Overall Cognitive Status: Impaired/Different from baseline Area of Impairment: Orientation, Following commands                 Orientation Level: Disoriented to, Person, Place, Time, Situation     Following Commands: Follows one step commands with increased time, Follows one step commands inconsistently       General Comments: Per dtr at bedside, pt is not at baseline for cognition. He is unable to state his name or DOB. Generally responds "yeah, yeah" to questions. Follows VCs with incrased time and multimodal cueing.     General Comments       Exercises Other Exercises Other Exercises: Pt/caregiver educated on role of OT in acute setting, strategies to support cognition and orientation, and DC recs.   Shoulder Instructions      Home Living Family/patient expects to be discharged to:: Assisted living                                        Prior Functioning/Environment Prior Level of Function : Needs assist  Cognitive Assist : Mobility (cognitive);ADLs (cognitive)           Mobility Comments: Per dtr at bedside, pt is ambulatory with RW at his facility. Does not go far, tends to sleep alot during the day and stay in his chair, but can transfer to/from bathroom etc. ADLs Comments: Pt requires assistance with most ADL tasks including set up assist for feeding/grooming and physical assist for bathing and dressing per dtr.        OT  Problem List: Decreased strength;Decreased coordination;Decreased range of motion;Decreased cognition;Decreased activity tolerance;Decreased safety awareness;Impaired balance (sitting and/or standing);Decreased knowledge of use of DME or AE      OT Treatment/Interventions: Self-care/ADL training;Therapeutic exercise;Therapeutic activities;DME and/or AE instruction;Patient/family education;Energy conservation    OT Goals(Current goals can be found in the care plan section) Acute Rehab OT Goals Patient Stated Goal: Dtr would like pt to go back to [employer] [facility] OT Goal Formulation: With patient/family Time For Goal Achievement: 08/18/22 Potential to Achieve Goals: Good ADL Goals Pt Will Perform Grooming: sitting;with set-up;with supervision Pt Will Perform Lower Body Dressing: sit to/from stand;with min assist;with caregiver independent in assisting Pt Will Transfer to Toilet: bedside commode;ambulating;with supervision;with set-up Pt Will Perform Toileting - Clothing Manipulation and hygiene: sit to/from stand;with supervision;with set-up;with caregiver independent in assisting  OT Frequency: Min 1X/week    Co-evaluation PT/OT/SLP Co-Evaluation/Treatment: Yes Reason for Co-Treatment: Necessary to address cognition/behavior during functional activity;For patient/therapist safety;To address functional/ADL transfers PT goals addressed during session: Mobility/safety with mobility;Balance OT goals addressed during session: ADL's and self-care;Proper use of Adaptive equipment and DME  AM-PAC OT "6 Clicks" Daily Activity     Outcome Measure Help from another person eating meals?: A Little (per function, NPO at time of initial evaluation.) Help from another person taking care of personal grooming?: A Little Help from another person toileting, which includes using toliet, bedpan, or urinal?: A Lot Help from another person bathing (including washing, rinsing, drying)?: A Little Help from another  person to put on and taking off regular upper body clothing?: A Lot Help from another person to put on and taking off regular lower body clothing?: A Little 6 Click Score: 16   End of Session Equipment Utilized During Treatment: Gait belt;Rolling walker (2 wheels) Nurse Communication: Mobility status  Activity Tolerance: Patient tolerated treatment well Patient left: in bed;with call bell/phone within reach;with bed alarm set;with family/visitor present  OT Visit Diagnosis: Other abnormalities of gait and mobility (R26.89);Muscle weakness (generalized) (M62.81);Other symptoms and signs involving cognitive function                Time: 1610-9604 OT Time Calculation (min): 24 min Charges:  OT General Charges $OT Visit: 1 Visit OT Evaluation $OT Eval Moderate Complexity: 1 Mod  Aurea Aronov Smith Robert, M.S., OTR/L 08/04/22, 11:55 AM

## 2022-08-04 NOTE — Progress Notes (Signed)
PHARMACY NOTE:  ANTIMICROBIAL RENAL DOSAGE ADJUSTMENT  Current antimicrobial regimen includes a mismatch between antimicrobial dosage and estimated renal function.  As per policy approved by the Pharmacy & Therapeutics and Medical Executive Committees, the antimicrobial dosage will be adjusted accordingly.  Current antimicrobial dosage: Ceftazidime 1 g IV q8h  Indication: Pseudomonas putida UTI  Renal Function:  Estimated Creatinine Clearance: 43.5 mL/min (by C-G formula based on SCr of 1.05 mg/dL).    Antimicrobial dosage has been changed to: Ceftazidime 1 g IV q12h  Thank you for allowing pharmacy to be a part of this patient's care.  Tressie Ellis, [facility] 08/04/2022 3:49 PM

## 2022-08-04 NOTE — Progress Notes (Signed)
Subjective: No significant changes  Exam: Vitals:   08/04/22 0900 08/04/22 1023  BP:  128/66  Pulse: 89 71  Resp:  18  Temp:  98.2 F (36.8 C)  SpO2: 99% 98%   Gen: In bed, NAD Resp: non-labored breathing, no acute distress Abd: soft, nt  Neuro: MS: He is able to answer some simple questions such as when I ask "are you in pain", but is unable to name simple objects even when presented in his hand (I placed keys in his hand and asked him to identify them).  He states I do not know too many question Including his name CN: Eyes are open, there is some reactivity bilaterally, but he does not fixate or track.  He has mild left facial weakness (baseline) Motor: He moves all extremities spontaneously and squeezes fingers to command Sensory: Response to stimulation bilaterally   Pertinent Labs: UA with some leukocytes, but not overly impressive Ammonia is normal TSH, B12 is pending MRI negative  Impression: 89 year old male with acute altered mental status.  Possibilities include delirium secondary to some as yet identified physiological stressor such as urinary tract infection, seizure, or toxic/metabolic encephalopathy.  Recommendations: 1) follow-up TSH and B12 2) EEG 3) agree with starting treatment for possible UTI given UA results pending urine culture 4) neurology will follow.  Ritta Slot, MD [facility] [phone]  If 7pm- 7am, please page neurology on call as listed in AMION.

## 2022-08-05 DIAGNOSIS — G9341 Metabolic encephalopathy: Secondary | ICD-10-CM | POA: Diagnosis not present

## 2022-08-05 LAB — BASIC METABOLIC PANEL
Anion gap: 6 (ref 5–15)
BUN: 27 mg/dL — ABNORMAL HIGH (ref 8–23)
CO2: 24 mmol/L (ref 22–32)
Calcium: 8.5 mg/dL — ABNORMAL LOW (ref 8.9–10.3)
Chloride: 108 mmol/L (ref 98–111)
Creatinine, Ser: 1.01 mg/dL (ref 0.61–1.24)
GFR, Estimated: >60 mL/min (ref >60)
Glucose, Bld: 101 mg/dL — ABNORMAL HIGH (ref 70–99)
Potassium: 3.5 mmol/L (ref 3.5–5.1)
Sodium: 138 mmol/L (ref 135–145)

## 2022-08-05 LAB — CBC
HCT: 31.8 % — ABNORMAL LOW (ref 39.0–52.0)
Hemoglobin: 11.1 g/dL — ABNORMAL LOW (ref 13.0–17.0)
MCH: 32.8 pg (ref 26.0–34.0)
MCHC: 34.9 g/dL (ref 30.0–36.0)
MCV: 94.1 fL (ref 80.0–100.0)
Platelets: 207 10*3/uL (ref 150–400)
RBC: 3.38 MIL/uL — ABNORMAL LOW (ref 4.22–5.81)
RDW: 13 % (ref 11.5–15.5)
WBC: 6.7 10*3/uL (ref 4.0–10.5)
nRBC: 0 % (ref 0.0–0.2)

## 2022-08-05 LAB — PHOSPHORUS: Phosphorus: 3.9 mg/dL (ref 2.5–4.6)

## 2022-08-05 LAB — MAGNESIUM: Magnesium: 2 mg/dL (ref 1.7–2.4)

## 2022-08-05 LAB — GLUCOSE, CAPILLARY
Glucose-Capillary: 103 mg/dL — ABNORMAL HIGH (ref 70–99)
Glucose-Capillary: 105 mg/dL — ABNORMAL HIGH (ref 70–99)
Glucose-Capillary: 138 mg/dL — ABNORMAL HIGH (ref 70–99)

## 2022-08-05 LAB — HEMOGLOBIN A1C
Hgb A1c MFr Bld: 5.6 % (ref 4.8–5.6)
Mean Plasma Glucose: 114 mg/dL

## 2022-08-05 LAB — PROTIME-INR
INR: 1.6 — ABNORMAL HIGH (ref 0.8–1.2)
Prothrombin Time: 18.8 seconds — ABNORMAL HIGH (ref 11.4–15.2)

## 2022-08-05 MED ORDER — CYANOCOBALAMIN 1000 MCG/ML IJ SOLN
1000.0000 ug | Freq: Every day | INTRAMUSCULAR | Status: DC
  Administered 2022-08-05 – 2022-08-10 (×6): 1000 ug via INTRAMUSCULAR
  Filled 2022-08-05 (×6): qty 1

## 2022-08-05 MED ORDER — WARFARIN SODIUM 6 MG PO TABS
6.0000 mg | ORAL_TABLET | Freq: Once | ORAL | Status: AC
  Administered 2022-08-05: 6 mg via ORAL
  Filled 2022-08-05: qty 1

## 2022-08-05 MED ORDER — POTASSIUM CHLORIDE 20 MEQ PO PACK
40.0000 meq | PACK | Freq: Once | ORAL | Status: AC
  Administered 2022-08-05: 40 meq via ORAL
  Filled 2022-08-05: qty 2

## 2022-08-05 MED ORDER — VITAMIN B-12 1000 MCG PO TABS: 1000.0000 ug | ORAL_TABLET | Freq: Every day | ORAL | Status: DC

## 2022-08-05 MED ORDER — VITAMIN D (ERGOCALCIFEROL) 1.25 MG (50000 UNIT) PO CAPS
50000.0000 [IU] | ORAL_CAPSULE | ORAL | Status: DC
  Administered 2022-08-05: 50000 [IU] via ORAL
  Filled 2022-08-05: qty 1

## 2022-08-05 MED ORDER — POTASSIUM CHLORIDE CRYS ER 20 MEQ PO TBCR
40.0000 meq | EXTENDED_RELEASE_TABLET | Freq: Once | ORAL | Status: DC
  Filled 2022-08-05: qty 2

## 2022-08-05 MED ORDER — METOPROLOL TARTRATE 25 MG PO TABS
25.0000 mg | ORAL_TABLET | Freq: Two times a day (BID) | ORAL | Status: DC
  Administered 2022-08-05 – 2022-08-10 (×11): 25 mg via ORAL
  Filled 2022-08-05 (×11): qty 1

## 2022-08-05 NOTE — Progress Notes (Signed)
Physical Therapy Treatment Patient Details Name: Erik Larson MRN: [id number] DOB: 1924/06/19 Today's Date: 08/05/2022   History of Present Illness 89 y/o male presented to ED on 08/03/22 for AMS and aphasia. CT and MRI negative. Admitted for acute encephalopathy. PMH: glaucome, Afib, HTN    PT Comments    Patient continues to demonstrate confusion and oriented to self only. Required minA bed mobility and min guard for sit to stand. ModA for ambulation with RW 10' but required max cues for RW management and directional due to visual impairment. Patient with increased difficulty following commands this session but more awake. Discharge plan remains appropriate.    Recommendations for follow up therapy are one component of a multi-disciplinary discharge planning process, led by the attending physician.  Recommendations may be updated based on patient status, additional functional criteria and insurance authorization.  Follow Up Recommendations       Assistance Recommended at Discharge Frequent or constant Supervision/Assistance  Patient can return home with the following A lot of help with walking and/or transfers;A lot of help with bathing/dressing/bathroom;Assistance with cooking/housework;Assistance with feeding;Direct supervision/assist for medications management;Direct supervision/assist for financial management;Assist for transportation   Equipment Recommendations  None recommended by PT    Recommendations for Other Services       Precautions / Restrictions Precautions Precautions: Fall Restrictions Weight Bearing Restrictions: No     Mobility  Bed Mobility Overal bed mobility: Needs Assistance Bed Mobility: Supine to Sit, Sit to Supine     Supine to sit: Min assist Sit to supine: Min assist        Transfers Overall transfer level: Needs assistance Equipment used: Rolling Shakesha Soltau (2 wheels) Transfers: Sit to/from Stand Sit to Stand: Min guard            General transfer comment: increased time to complete    Ambulation/Gait Ambulation/Gait assistance: Mod assist Gait Distance (Feet): 10 Feet Assistive device: Rolling Micco Bourbeau (2 wheels) Gait Pattern/deviations: Step-to pattern, Decreased stride length Gait velocity: decreased     General Gait Details: constant cueing and assistance for RW management and directional cues. Increasing physical assistance with time as patient took ~8 mins to ambulate 10'. Difficulty processing turning with RW   Stairs             Wheelchair Mobility    Modified Rankin (Stroke Patients Only)       Balance Overall balance assessment: Needs assistance Sitting-balance support: Feet supported, No upper extremity supported Sitting balance-Leahy Scale: Good     Standing balance support: Reliant on assistive device for balance, Bilateral upper extremity supported Standing balance-Leahy Scale: Fair                              Cognition Arousal/Alertness: Awake/alert Behavior During Therapy: WFL for tasks assessed/performed Overall Cognitive Status: Impaired/Different from baseline Area of Impairment: Orientation, Following commands, Problem solving                 Orientation Level: Disoriented to, Person, Place, Time, Situation     Following Commands: Follows one step commands with increased time, Follows one step commands inconsistently     Problem Solving: Slow processing, Difficulty sequencing, Requires verbal cues General Comments: difficulty following simple commands this session. Requires frequent cues and assistance for directions during mobility        Exercises      General Comments        Pertinent Vitals/Pain Pain Assessment Pain Assessment:  No/denies pain    Home Living                          Prior Function            PT Goals (current goals can now be found in the care plan section) Acute Rehab PT Goals PT Goal Formulation:  With family Time For Goal Achievement: 08/18/22 Potential to Achieve Goals: Fair Progress towards PT goals: Progressing toward goals    Frequency    Min 2X/week      PT Plan Current plan remains appropriate    Co-evaluation              AM-PAC PT "6 Clicks" Mobility   Outcome Measure  Help needed turning from your back to your side while in a flat bed without using bedrails?: A Little Help needed moving from lying on your back to sitting on the side of a flat bed without using bedrails?: A Little Help needed moving to and from a bed to a chair (including a wheelchair)?: A Little Help needed standing up from a chair using your arms (e.g., wheelchair or bedside chair)?: A Little Help needed to walk in hospital room?: A Lot Help needed climbing 3-5 steps with a railing? : Total 6 Click Score: 15    End of Session Equipment Utilized During Treatment: Gait belt Activity Tolerance: Patient tolerated treatment well Patient left: in bed;with call bell/phone within reach;with bed alarm set Nurse Communication: Mobility status PT Visit Diagnosis: Unsteadiness on feet (R26.81);Muscle weakness (generalized) (M62.81);Difficulty in walking, not elsewhere classified (R26.2)     Time: 1610-9604 PT Time Calculation (min) (ACUTE ONLY): 16 min  Charges:  $Gait Training: 8-22 mins                     Maylon Peppers, PT, DPT Physical Therapist - [facility]  [facility]  Gregoire Bennis A Kiyah Demartini 08/05/2022, 1:25 PM

## 2022-08-05 NOTE — Progress Notes (Signed)
[facility] Progress Note  Patient: Erik Larson    JXB:[id number]  DOA: 08/03/2022     Date of Service: the patient was seen and examined on 08/05/2022  Chief Complaint  Patient presents with   Code Stroke   Brief hospital course:  Erik Larson is a 89 y.o. male with medical history significant of atrial fibrillation on Coumadin, GERD, hyperlipidemia, BPH/urinary retention status post suprapubic catheter, hypertension presenting with encephalopathy and suspected CVA.  Limited history in the setting of encephalopathy.  Patient confused and answering minimal questions.  Per report, patient with an acute change in mentation since earlier today.  Patient was noted to be acutely confused by his daughter around 34 AM.  Patient lives at a local assisted living facility.  Per report, patient usually conversive at baseline.  Had acute confusion as well as aphasia per report.  No reports of fevers or chills.  No reports of chest pain shortness of breath nausea vomiting.  Noted admission March 30 through April 2 for similar issues associated with UTI.  Had urine culture that showed Enterococcus faecalis and Corynebacterium species.  Completed course of IV ceftazidime as well as oral nitrofurantoin based on urine sensitivities.  No reports of recent change in urine pattern.  No reported abdominal pain or diarrhea. Presented to the ER afebrile, pressures 170s over 100s, satting well on room air.  White count 6.0, hemoglobin 12, platelets 234, urinalysis grossly stable apart from small leukocytes and rare bacteria, INR 2, creatinine 1.05.  Code stroke CT head as well as CTA head and neck grossly stable.  MRI of the brain pending.  Not a TNK candidate given elevated INR.   Assessment and Plan:  # Acute metabolic encephalopathy CVA ruled out, CT scan and MRI negative for any acute findings EEG: There was no seizure or seizure predisposition recorded on this study. Please note that lack of epileptiform  activity on EEG does not preclude the possibility of epilepsy.  Continue supportive care and fall precautions, continue aspiration precautions PT and OT eval, SLP eval Ambulate with assistance Neurology was consulted, recommended encephalopathy could be secondary to UTI, no further intervention.   # UTI, urine culture growing Pseudomonas Patient has chronic suprapubic Foley catheter which was exchanged on 06/30/2022 Foley catheter was replaced on 08/04/2022.  Family was educated to replace Foley catheter every 4 weeks. IR recommended to consult urology, and urology recommended to contact ICU RN to exchange Foley catheter. urine culture growing Pseudomonas 60k and diphtheroids >100k S/p ceftriaxone 1 g IV daily, switched to ceftazidime 1 g every 12 hourly   # Chronic persistent A-fib, HTN Continue to monitor on telemetry Continue Coumadin, pharmacy consulted for INR monitoring Increased Lopressor 25 mg p.o. twice daily Use IV Lopressor as needed  # History of gout, continued allopurinol # BPH, continue Proscar # Vitamin B12 deficiency: Started vitamin B12 1000 mcg IM injection daily during hospital stay, followed by oral supplement.  Follow-up PCP to repeat vitamin B12 level after 3 to 6 months. # Vitamin D deficiency: started vitamin D 50,000 units p.o. weekly, follow with PCP to repeat vitamin D level after 3 to 6 months.  Body mass index is 29.29 kg/m.  Interventions:    Diet: Dysphagia 3 diet DVT Prophylaxis: Therapeutic Anticoagulation with Coumadin    Advance goals of care discussion: Full code  Family Communication: family was present at bedside, at the time of interview.  Family was at bedside, management plan discussed with patient's daughter, all question  and concerns answered.   Disposition:  Pt is from LTC, admitted with AMS, due to UTI, urine culture growing Pseudomonas, patient is still on IV antibiotics, which precludes a safe discharge. Discharge to long-term  care facility.  TOC consulted for discharge planning, stated that facility will accept the patient on Monday.    Subjective: No significant events overnight, patient's mental status has improved, patient was resting comfortably, denies any complaints, patient's daughter was at bedside, management plan discussed and all question and concerns answered.   Physical Exam: General: NAD, lying comfortably, obtunded and dozing off Appear in no distress, in deep sleep Eyes: PERRLA, visual impairment due to macular degeneration, legally blind ENT: Oral Mucosa Clear, moist  Neck: no JVD,  Cardiovascular: S1 and S2 Present, no Murmur,  Respiratory: good respiratory effort, Bilateral Air entry equal and Decreased, no Crackles, no wheezes Abdomen: Bowel Sound present, Soft and no tenderness,  Skin: no rashes Extremities: no Pedal edema, no calf tenderness Neurologic: without any new focal findings Gait not checked due to patient safety concerns  Vitals:   08/05/22 0004 08/05/22 0430 08/05/22 0820 08/05/22 1148  BP: 136/78 122/63 (!) 162/120 (!) 111/56  Pulse: 80 67 85 78  Resp: 18 16 16 14   Temp: 98.7 F (37.1 C) 98.1 F (36.7 C) 97.6 F (36.4 C) 98.3 F (36.8 C)  TempSrc:      SpO2: 99% 98% 94% 96%  Weight:      Height:        Intake/Output Summary (Last 24 hours) at 08/05/2022 1247 Last data filed at 08/05/2022 1124 Gross per 24 hour  Intake 680 ml  Output 475 ml  Net 205 ml   Filed Weights   08/03/22 1335 08-26-2022 1541  Weight: 84.8 kg 80.1 kg    Data Reviewed: I have personally reviewed and interpreted daily labs, tele strips, imagings as discussed above. I reviewed all nursing notes, pharmacy notes, vitals, pertinent old records I have discussed plan of care as described above with RN and patient/family.  CBC: Recent Labs  Lab 08/03/22 1307 08/05/22 0535  WBC 6.0 6.7  NEUTROABS 3.6  --   HGB 12.0* 11.1*  HCT 35.6* 31.8*  MCV 96.0 94.1  PLT 234 207   Basic  Metabolic Panel: Recent Labs  Lab 08/03/22 1307 08/26/22 0706 08/05/22 0535  NA 136  --  138  K 4.4  --  3.5  CL 103  --  108  CO2 25  --  24  GLUCOSE 99  --  101*  BUN 18  --  27*  CREATININE 1.05  --  1.01  CALCIUM 9.0  --  8.5*  MG  --  1.9 2.0  PHOS  --  3.7 3.9    Studies: EEG adult  Result Date: August 26, 2022 Rejeana Brock, MD     08/05/2022  8:30 AM History: 89 yo M with AMS Sedation: none Technique: This EEG was acquired with electrodes placed according to the International 10-20 electrode system (including Fp1, Fp2, F3, F4, C3, C4, P3, P4, O1, O2, T3, T4, T5, T6, A1, A2, Fz, Cz, Pz). The following electrodes were missing or displaced: none. Background: There is a posterior dominant rhythm that is poorly formed and poorly sustained of 7-8 Hz. There is also diffuse irregular slow activity throughout the study. With arousal, there are frontally predominant discharges that are periodic with a frequency of 1 Hz with triphasic morphology. Photic stimulation: Physiologic driving is not perofrmed EEG Abnormalities: 1) Generalized periodic  discharges(GPDs) with triphasic morphology 2) Generalized slow activity 3) Slow PDR Clinical Interpretation: This . There was no seizure or seizure predisposition recorded on this study. Please note that lack of epileptiform activity on EEG does not preclude the possibility of epilepsy. Ritta Slot, MD [facility] [phone] If 7pm- 7am, please page neurology on call as listed in AMION.    Scheduled Meds:   stroke: early stages of recovery book   Does not apply Once   allopurinol  100 mg Oral Daily   cyanocobalamin  1,000 mcg Intramuscular Daily   Followed by   Melene Muller ON 08/12/2022] vitamin B-12  1,000 mcg Oral Daily   feeding supplement  237 mL Oral TID BM   finasteride  5 mg Oral Daily   metoprolol tartrate  25 mg Oral BID   multivitamin with minerals  1 tablet Oral Daily   risperiDONE  0.5 mg Oral QHS   Vitamin D  (Ergocalciferol)  50,000 Units Oral Q7 days   warfarin  6 mg Oral ONCE-1600   Warfarin - Pharmacist Dosing Inpatient   Does not apply q1600   Continuous Infusions:  cefTAZidime (FORTAZ)  IV Stopped (08/05/22 0914)   PRN Meds: acetaminophen, metoprolol tartrate, ondansetron (ZOFRAN) IV  Time spent: 55 minutes  Author: Gillis Santa. MD [facility] 08/05/2022 12:47 PM  To reach On-call, see care teams to locate the attending and reach out to them via [email or web address]. If 7PM-7AM, please contact night-coverage If you still have difficulty reaching the attending provider, please page the [facility] (Director on Call) for [facility] on amion for assistance.

## 2022-08-05 NOTE — Progress Notes (Signed)
Speech Language Pathology Treatment: Dysphagia  Patient Details Name: Erik Larson MRN: [id number] DOB: 10-04-1924 Today's Date: 08/05/2022 Time: 4098-1191 SLP Time Calculation (min) (ACUTE ONLY): 12 min  Assessment / Plan / Recommendation Clinical Impression  Pt seen for on-going diet toleration. Pt was finishing his lunch when SLP entered room. Pt continues with confusion, oriented to himself only and began talking about "some bulldogs over underneath the branch right there" (pointing to his right.) Pt was easily re-direct to eating/drinking. Pt was free of overt s/s of dysphagia and aspiration when consuming bites of his sandwich and consuming his nutritional supplement via straw.   At baseline, pt was consuming finger foods d/t his vision deficits, therefore recommend continuation of current diet. No further ST services are indicated at this time.     HPI HPI: Per H&P "Erik Larson is a 89 y.o. male with medical history significant of atrial fibrillation on Coumadin, GERD, hyperlipidemia, BPH/urinary retention status post suprapubic catheter, hypertension presenting with encephalopathy and suspected CVA.  Limited history in the setting of encephalopathy.  Patient confused and answering minimal questions.  Per report, patient with an acute change in mentation since earlier today.  Patient was noted to be acutely confused by his daughter around 51 AM.  Patient lives at a local assisted living facility.  Per report, patient usually conversive at baseline.  Had acute confusion as well as aphasia per report.  No reports of fevers or chills.  No reports of chest pain shortness of breath nausea vomiting.  Noted admission March 30 through April 2 for similar issues associated with UTI.  Had urine culture that showed Enterococcus faecalis and Corynebacterium species.  Completed course of IV ceftazidime as well as oral nitrofurantoin based on urine sensitivities.  No reports of recent change in urine  pattern.  No reported abdominal pain or diarrhea.  Presented to the ER afebrile, pressures 170s over 100s, satting well on room air.  White count 6.0, hemoglobin 12, platelets 234, urinalysis grossly stable apart from small leukocytes and rare bacteria, INR 2, creatinine 1.05.  Code stroke CT head as well as CTA head and neck grossly stable.  MRI of the brain pending.  Not a TNK candidate given elevated INR.  Review of Systems: As mentioned in the history of present illness. All other systems reviewed and are negative." MRI brain "1. No evidence of acute intracranial abnormality.  2. Remote left basal ganglia infarct.  3.  Cerebral Atrophy (ICD10-G31.9)."      SLP Plan  All goals met      Recommendations for follow up therapy are one component of a multi-disciplinary discharge planning process, led by the attending physician.  Recommendations may be updated based on patient status, additional functional criteria and insurance authorization.    Recommendations  Diet recommendations: Dysphagia 3 (mechanical soft);Thin liquid Liquids provided via: Straw;Cup Medication Administration: Whole meds with puree Supervision: Intermittent supervision to cue for compensatory strategies;Staff to assist with self feeding Compensations: Slow rate;Small sips/bites Postural Changes and/or Swallow Maneuvers: Seated upright 90 degrees                  Oral care BID   Frequent or constant Supervision/Assistance Dysphagia, oral phase (R13.11)     All goals met    Erik Larson B. Dreama Saa, M.S., CCC-SLP, Tree surgeon Certified Brain Injury Specialist [facility]  [facility] Office [phone] Ascom [phone] Fax [phone]

## 2022-08-05 NOTE — Progress Notes (Signed)
Civil engineer, contracting [facility]) Hospital Liaison Note:   (new referral for outpatient palliative services) Notified by [facility], Charlynn Court, LCSW,of patient/family request for [facility] Palliative Care services at [facility]F at dc. [facility] hospital liaison will follow patient for discharge disposition. Please call with any hospice or outpatient palliative care related questions. Thank you for the opportunity to participate in this patient's care.  Redge Gainer, [facility] Hospital Liaison [phone]

## 2022-08-05 NOTE — Progress Notes (Signed)
Subjective: Patient is markedly improved  Exam: Vitals:   08/05/22 0430 08/05/22 0820  BP: 122/63 (!) 162/120  Pulse: 67 85  Resp: 16 16  Temp: 98.1 F (36.7 C) 97.6 F (36.4 C)  SpO2: 98% 94%   Gen: In bed, NAD Resp: non-labored breathing, no acute distress Abd: soft, nt  Neuro: MS: Awake, able to tell me his name, states that he is in a hospital but does not know which one, does not know his age or the year CN: He is able to see fingers wiggling in the left lower quadrant at times but very inconsistent due to his macular degeneration Motor: Moves all extremities spontaneously  Pertinent Labs: Urine culture positive for Pseudomonas and cornybacterium  Impression: 89 year old male who presented with altered mental status in the setting of UTI.  Given that he has presented similarly with previous UTIs and his robust response to treatment, I think that this is likely the etiology of his altered mental status.  I would expect him to gradually improve over time.  With negative EEG and MRI, no further testing at the current time.  Recommendations: 1) neurology will be available as needed.  Ritta Slot, MD [facility] [phone]  If 7pm- 7am, please page neurology on call as listed in AMION.

## 2022-08-05 NOTE — TOC Initial Note (Addendum)
Transition of Care [facility]) - Initial/Assessment Note    Patient Details  Name: Erik Larson MRN: [id number] Date of Birth: 1925/01/09  [facility]) CM/SW Contact:    Margarito Liner, LCSW Phone Number: 08/05/2022, 9:41 AM  Clinical Narrative:  Per chart review, patient is a resident at [facility]. CSW called and spoke to [place] who confirmed. She said patient is already active with [facility] for nursing. CSW spoke to [employer] who confirmed they can add PT and OT. Chana Bode will have the nurse call CSW back to determine if they will need to assess him prior to return. No DME recommendations at this time. No further concerns. CSW encouraged patient to contact CSW as needed. CSW will continue to follow patient for support and facilitate return to ALF once medically stable.                10:00 am: Received call from Raymond at [facility]. The earliest patient will be able to return is Monday because they will have to come assess him first. She is unable to come today and they do not accept admissions over the weekend. Sent secure chat to MD to notify.  Expected Discharge Plan: Assisted Living (with home health) Barriers to Discharge: Continued Medical Work up   Patient Goals and CMS Choice            Expected Discharge Plan and Services     Post Acute Care Choice: Resumption of Svcs/PTA Provider Living arrangements for the past 2 months: Assisted Living Facility                           HH Arranged: RN, PT, OT [facility] Agency: [facility] Date [facility] Agency Contacted: 08/05/22   Representative spoke with at [facility] Agency: Becky Sax  Prior Living Arrangements/Services Living arrangements for the past 2 months: Assisted Living Facility Lives with:: Facility Resident Patient language and need for interpreter reviewed:: Yes        Need for Family Participation in Patient Care: Yes (Comment) Care giver support system in place?: Yes  (comment) Current home services: Home RN Criminal Activity/Legal Involvement Pertinent to Current Situation/Hospitalization: No - Comment as needed  Activities of Daily Living Home Assistive Devices/Equipment: Environmental consultant (specify type) ADL Screening (condition at time of admission) Patient's cognitive ability adequate to safely complete daily activities?: No Is the patient deaf or have difficulty hearing?: No Does the patient have difficulty seeing, even when wearing glasses/contacts?: Yes Does the patient have difficulty concentrating, remembering, or making decisions?: Yes Patient able to express need for assistance with ADLs?: Yes Does the patient have difficulty dressing or bathing?: Yes Independently performs ADLs?: No Communication: Independent Dressing (OT): Needs assistance Is this a change from baseline?: Pre-admission baseline Grooming: Needs assistance Is this a change from baseline?: Pre-admission baseline Feeding: Appropriate for developmental age Bathing: Needs assistance Is this a change from baseline?: Pre-admission baseline Toileting: Appropriate for developmental age In/Out Bed: Needs assistance Is this a change from baseline?: Pre-admission baseline Walks in Home: Needs assistance Is this a change from baseline?: Pre-admission baseline Does the patient have difficulty walking or climbing stairs?: Yes Weakness of Legs: Both Weakness of Arms/Hands: Both  Permission Sought/Granted Permission sought to share information with : Photographer granted to share info w AGENCY: [facility], [facility]        Emotional Assessment  Attitude/Demeanor/Rapport: Unable to Assess Affect (typically observed): Unable to Assess Orientation: : Oriented to Self Alcohol / Substance Use: Not Applicable Psych Involvement: No (comment)  Admission diagnosis:  CVA (cerebral vascular accident) (HCC) [I63.9] Left leg weakness  [R29.898] Altered mental status, unspecified altered mental status type [R41.82] Patient Active Problem List   Diagnosis Date Noted   CVA (cerebral vascular accident) (HCC) 08/03/2022   Acute encephalopathy 08/03/2022   Acute UTI 06/13/2022   Altered mental status 06/11/2022   Benign prostatic hyperplasia with lower urinary tract symptoms 10/20/2021   Essential hypertension 10/20/2021   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   Polypharmacy 08/17/2021   Acute metabolic encephalopathy 08/15/2021   Moderate dementia (HCC) 07/25/2021   Anemia 06/13/2021   Hyperuricemia 10/09/2015   BPH associated with nocturia 10/09/2015   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 [place] [address] Phone: [phone] Fax: [phone]  [facility] [address] [place] - [address] [place] [address] Phone: [phone] Fax: [phone]  [facility] - [place], [place] - [address] [place] [address] Phone: [phone] Fax: [phone]     Social Determinants of Health (SDOH) Social History: SDOH Screenings   Food Insecurity: No Food Insecurity (08/04/2022)  Housing: Low Risk  (08/04/2022)  Transportation Needs: No Transportation Needs (08/04/2022)  Utilities: Not At Risk (08/04/2022)  Financial Resource Strain: Low Risk  (07/12/2017)  Physical Activity: Inactive (07/12/2017)  Social Connections: Unknown (07/12/2017)   Stress: No Stress Concern Present (07/12/2017)  Tobacco Use: Medium Risk (08/04/2022)   SDOH Interventions:     Readmission Risk Interventions     No data to display

## 2022-08-05 NOTE — Evaluation (Signed)
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   Polypharmacy 08/17/2021   Acute metabolic encephalopathy 08/15/2021   Moderate dementia (HCC) 07/25/2021   Anemia 06/13/2021   Hyperuricemia 10/09/2015   BPH associated with nocturia 10/09/2015   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/29/2014   Interval gout 07/29/2014   Diverticulitis of colon 07/29/2014   Long term current use of anticoagulant therapy 07/29/2014   Atrial fibrillation, controlled (HCC) 07/29/2014   Acid reflux 07/29/2014   Past Medical History:  Past Medical History:  Diagnosis Date   Arthritis    Atrial fibrillation (HCC)    controlled on coumadin   GERD (gastroesophageal reflux disease)    Glaucoma    Gout    Hyperlipidemia    valvular heart disease    Warfarin therapy started 07/29/2014   Past Surgical History:  Past Surgical History:  Procedure Laterality Date   COLONOSCOPY  2005 ?   GLAUCOMA SURGERY     IR CATHETER TUBE CHANGE  06/30/2022   HPI:  Per H&P "Erik Larson is a 89 y.o. male with medical history significant of atrial fibrillation on Coumadin, GERD, hyperlipidemia, BPH/urinary retention status post suprapubic catheter, hypertension presenting with encephalopathy and suspected CVA.  Limited history in the setting of encephalopathy.  Patient confused and answering minimal questions.  Per report, patient with an acute change in mentation since earlier today.  Patient was noted  to be acutely confused by his daughter around 68 AM.  Patient lives at a local assisted living facility.  Per report, patient usually conversive at baseline.  Had acute confusion as well as aphasia per report.  No reports of fevers or chills.  No reports of chest pain shortness of breath nausea vomiting.  Noted admission March 30 through April 2 for similar issues associated with UTI.  Had urine culture that showed Enterococcus faecalis and Corynebacterium species.  Completed course of IV ceftazidime as well as oral nitrofurantoin based on urine sensitivities.  No reports of recent change in urine pattern.  No reported abdominal pain or diarrhea.  Presented to the ER afebrile, pressures 170s over 100s, satting well on room air.  White count 6.0, hemoglobin 12, platelets 234, urinalysis grossly stable apart from small leukocytes and rare bacteria, INR 2, creatinine 1.05.  Code stroke CT head as well as CTA head and neck grossly stable.  MRI of the brain pending.  Not a TNK candidate given elevated INR.  Review of Systems: As mentioned in the history of present illness. All other systems reviewed and are negative." MRI brain "1. No evidence of acute intracranial abnormality.  2. Remote left basal ganglia infarct.  3.  Cerebral Atrophy (ICD10-G31.9)."   Assessment / Plan / Recommendation Clinical Impression  While pt continues to exhibit signs of confusion, per his chart pt's mentation appear to be improved over time of admission. Pt is oriented to himself and general location (hospital), is easily redirected to task, demonstrates good verbal turn taking and sustained attention to conversation. Pt does describe some visual hallucinations of "bulldogs" being  in his room. Suspect pt's confusion is multifactorial in nature related to new environment, inability to visually comprehend environment as well as UTI. Hopeful that his mentation will continue improving with medical treatment of UTI. At this time, skilled ST  intervention is not indicated.    SLP Assessment  SLP Recommendation/Assessment: All further Speech Lanaguage Pathology  needs can be addressed in the next venue of care SLP Visit Diagnosis: Cognitive communication deficit (R41.841)    Recommendations for follow up therapy are one component of a multi-disciplinary discharge planning process, led by the attending physician.  Recommendations may be updated based on patient status, additional functional criteria and insurance authorization.    Follow Up Recommendations  Follow physician's recommendations for discharge plan and follow up therapies    Assistance Recommended at Discharge  Frequent or constant Supervision/Assistance  Functional Status Assessment Patient has had a recent decline in their functional status and/or demonstrates limited ability to make significant improvements in function in a reasonable and predictable amount of time  Frequency and Duration     N/A      SLP Evaluation Cognition  Overall Cognitive Status: No family/caregiver present to determine baseline cognitive functioning Arousal/Alertness: Awake/alert Orientation Level: Oriented to person;Oriented to place (states he is at a hospital)       Comprehension  Auditory Comprehension Overall Auditory Comprehension: Appears within functional limits for tasks assessed Reading Comprehension Reading Status: Not tested (d/t vision deficits)    Expression Expression Primary Mode of Expression: Verbal Verbal Expression Overall Verbal Expression: Appears within functional limits for tasks assessed Written Expression Dominant Hand: Right Written Expression: Not tested   Oral / Motor  Oral Motor/Sensory Function Overall Oral Motor/Sensory Function: Within functional limits Motor Speech Overall Motor Speech: Appears within functional limits for tasks assessed           Quanda Pavlicek B. Dreama Saa, M.S., CCC-SLP, Tree surgeon Certified Brain Injury  Specialist [facility]  [facility] Office [phone] Ascom [phone] Fax [phone]

## 2022-08-05 NOTE — Consult Note (Signed)
ANTICOAGULATION CONSULT NOTE -   Pharmacy Consult for Warfarin Indication: atrial fibrillation  Allergies  Allergen Reactions   Ace Inhibitors    Penicillins Rash    Patient Measurements: Height: 5\' 10"  (177.8 cm) Weight: 80.1 kg (176 lb 9.4 oz) IBW/kg (Calculated) : 73  Vital Signs: Temp: 97.6 F (36.4 C) (05/24 0820) BP: 162/120 (05/24 0820) Pulse Rate: 85 (05/24 0820)  Labs: Recent Labs    08/03/22 1307 08/04/22 0521 08/05/22 0535  HGB 12.0*  --  11.1*  HCT 35.6*  --  31.8*  PLT 234  --  207  APTT 37*  --   --   LABPROT 22.5* 20.2* 18.8*  INR 2.0* 1.7* 1.6*  CREATININE 1.05  --  1.01     Estimated Creatinine Clearance: 45.2 mL/min (by C-G formula based on SCr of 1.01 mg/dL).   Medical History: Past Medical History:  Diagnosis Date   Arthritis    Atrial fibrillation (HCC)    controlled on coumadin   GERD (gastroesophageal reflux disease)    Glaucoma    Gout    Hyperlipidemia    valvular heart disease    Warfarin therapy started 07/29/2014    Medications:  PTA Warfarin: 35 mg/wk 5 mg daily  Assessment: Erik Larson is a 89 y.o. male presenting with concern for CVA. PMH significant for arthritis, AF, GERD, HLD, gout. Patient was on Warfarin PTA per chart review. Last dose of Warfarin PTA was on 08/02/2022 @ 1610 (5 mg). Of note, INR was 2.0 on home regimen on arrival. Per neurology, ok to resume home warfarin. Pharmacy has been consulted to initiate and manage warfarin.    Baseline Labs: aPTT 37, PT 22.5, INR 2.0, Hgb 12.0, Hct 35.6, Plt 236   Goal of Therapy:  INR 2-3 Monitor platelets by anticoagulation protocol: Yes   Date    INR      Warfarin Dose    5/22 2.0 5 mg (PTA)>>dose not given 5/23 1.7 6 mg 5/24 1.6  Plan:  Dose ordered for 5/22 was not given (likely seeing result of not getting that dose in the INR today 5/24) Will order warfarin 6 mg x1 dose again today  DDI; allopurinol (home med), Ceftazidime Check INR daily until  stable Check CBC at least every 72 hours  Fayne Mcguffee A, PharmD 08/05/2022 8:25 AM

## 2022-08-06 DIAGNOSIS — G9341 Metabolic encephalopathy: Secondary | ICD-10-CM | POA: Diagnosis not present

## 2022-08-06 LAB — GLUCOSE, CAPILLARY
Glucose-Capillary: 117 mg/dL — ABNORMAL HIGH (ref 70–99)
Glucose-Capillary: 94 mg/dL (ref 70–99)

## 2022-08-06 LAB — URINE CULTURE

## 2022-08-06 LAB — PROTIME-INR
INR: 1.7 — ABNORMAL HIGH (ref 0.8–1.2)
Prothrombin Time: 20.5 seconds — ABNORMAL HIGH (ref 11.4–15.2)

## 2022-08-06 LAB — CBC
HCT: 33.1 % — ABNORMAL LOW (ref 39.0–52.0)
Hemoglobin: 11.2 g/dL — ABNORMAL LOW (ref 13.0–17.0)
MCH: 32.3 pg (ref 26.0–34.0)
MCHC: 33.8 g/dL (ref 30.0–36.0)
MCV: 95.4 fL (ref 80.0–100.0)
Platelets: 218 10*3/uL (ref 150–400)
RBC: 3.47 MIL/uL — ABNORMAL LOW (ref 4.22–5.81)
RDW: 13 % (ref 11.5–15.5)
WBC: 7.7 10*3/uL (ref 4.0–10.5)
nRBC: 0 % (ref 0.0–0.2)

## 2022-08-06 LAB — BASIC METABOLIC PANEL
Anion gap: 5 (ref 5–15)
BUN: 22 mg/dL (ref 8–23)
CO2: 25 mmol/L (ref 22–32)
Calcium: 8.6 mg/dL — ABNORMAL LOW (ref 8.9–10.3)
Chloride: 110 mmol/L (ref 98–111)
Creatinine, Ser: 0.93 mg/dL (ref 0.61–1.24)
GFR, Estimated: >60 mL/min (ref >60)
Glucose, Bld: 102 mg/dL — ABNORMAL HIGH (ref 70–99)
Potassium: 3.8 mmol/L (ref 3.5–5.1)
Sodium: 140 mmol/L (ref 135–145)

## 2022-08-06 LAB — MAGNESIUM: Magnesium: 1.9 mg/dL (ref 1.7–2.4)

## 2022-08-06 LAB — PHOSPHORUS: Phosphorus: 3.7 mg/dL (ref 2.5–4.6)

## 2022-08-06 MED ORDER — HALOPERIDOL LACTATE 5 MG/ML IJ SOLN
1.0000 mg | Freq: Four times a day (QID) | INTRAMUSCULAR | Status: DC | PRN
  Administered 2022-08-06 – 2022-08-07 (×2): 1 mg via INTRAVENOUS
  Filled 2022-08-06 (×2): qty 1

## 2022-08-06 MED ORDER — WARFARIN SODIUM 5 MG PO TABS
5.0000 mg | ORAL_TABLET | Freq: Once | ORAL | Status: AC
  Administered 2022-08-06: 5 mg via ORAL
  Filled 2022-08-06: qty 1

## 2022-08-06 NOTE — Progress Notes (Signed)
[facility] Progress Note  Patient: Erik Larson    RJJ:[id number]  DOA: 08/03/2022     Date of Service: the patient was seen and examined on 08/06/2022  Chief Complaint  Patient presents with   Code Stroke   Brief hospital course:  Erik Larson is a 89 y.o. male with medical history significant of atrial fibrillation on Coumadin, GERD, hyperlipidemia, BPH/urinary retention status post suprapubic catheter, hypertension presenting with encephalopathy and suspected CVA.  Limited history in the setting of encephalopathy.  Patient confused and answering minimal questions.  Per report, patient with an acute change in mentation since earlier today.  Patient was noted to be acutely confused by his daughter around 57 AM.  Patient lives at a local assisted living facility.  Per report, patient usually conversive at baseline.  Had acute confusion as well as aphasia per report.  No reports of fevers or chills.  No reports of chest pain shortness of breath nausea vomiting.  Noted admission March 30 through April 2 for similar issues associated with UTI.  Had urine culture that showed Enterococcus faecalis and Corynebacterium species.  Completed course of IV ceftazidime as well as oral nitrofurantoin based on urine sensitivities.  No reports of recent change in urine pattern.  No reported abdominal pain or diarrhea. Presented to the ER afebrile, pressures 170s over 100s, satting well on room air.  White count 6.0, hemoglobin 12, platelets 234, urinalysis grossly stable apart from small leukocytes and rare bacteria, INR 2, creatinine 1.05.  Code stroke CT head as well as CTA head and neck grossly stable.  MRI of the brain pending.  Not a TNK candidate given elevated INR.   Assessment and Plan:  # Acute metabolic encephalopathy CVA ruled out, CT scan and MRI negative for any acute findings EEG: There was no seizure or seizure predisposition recorded on this study. Please note that lack of epileptiform  activity on EEG does not preclude the possibility of epilepsy.  Continue supportive care and fall precautions, continue aspiration precautions PT and OT eval, SLP eval Ambulate with assistance Neurology was consulted, recommended encephalopathy could be secondary to UTI, no further intervention.   # UTI, urine culture growing Pseudomonas Patient has chronic suprapubic Foley catheter which was exchanged on 06/30/2022 Foley catheter was replaced on 08/04/2022.  Family was educated to replace Foley catheter every 4 weeks. IR recommended to consult urology, and urology recommended to contact ICU RN to exchange Foley catheter. urine culture growing Pseudomonas [place] 60k sensitive to ceftazidime and Cipro. Diphtheroids >100k susceptibility is not available S/p ceftriaxone 1 g IV daily, switched to ceftazidime 1 g every 12 hourly, would treat with antibiotics for 7 days   # Chronic persistent A-fib, HTN Continue to monitor on telemetry Continue Coumadin, pharmacy consulted for INR monitoring Increased Lopressor 25 mg p.o. twice daily Use IV Lopressor as needed  # History of gout, continued allopurinol # BPH, continue Proscar # Vitamin B12 deficiency: Started vitamin B12 1000 mcg IM injection daily during hospital stay, followed by oral supplement.  Follow-up PCP to repeat vitamin B12 level after 3 to 6 months. # Vitamin D deficiency: started vitamin D 50,000 units p.o. weekly, follow with PCP to repeat vitamin D level after 3 to 6 months.  Body mass index is 29.29 kg/m.  Interventions:    Diet: Dysphagia 3 diet DVT Prophylaxis: Therapeutic Anticoagulation with Coumadin    Advance goals of care discussion: Full code  Family Communication: family was present at bedside, at the  time of interview.  Family was at bedside, management plan discussed with patient's daughter, all question and concerns answered.   Disposition:  Pt is from LTC, admitted with AMS, due to UTI, urine culture  growing Pseudomonas, patient is still on IV antibiotics, which precludes a safe discharge. Discharge to long-term care facility.  TOC consulted for discharge planning, stated that facility will accept the patient on Monday.    Subjective: No significant events overnight, patient was resting comfortably, denied any complaints.  Patient's daughter was at bedside, she was concerned about waxing and waning of mental status and intermittent agitation.  She requested for some medication, so patient was placed on Haldol 1 mg every 6 hourly as needed.    Physical Exam: General: NAD, lying comfortably, obtunded and dozing off Appear in no distress, in deep sleep Eyes: PERRLA, visual impairment due to macular degeneration, legally blind ENT: Oral Mucosa Clear, moist  Neck: no JVD,  Cardiovascular: S1 and S2 Present, no Murmur,  Respiratory: good respiratory effort, Bilateral Air entry equal and Decreased, no Crackles, no wheezes Abdomen: Bowel Sound present, Soft and no tenderness,  Skin: no rashes Extremities: no Pedal edema, no calf tenderness Neurologic: without any new focal findings Gait not checked due to patient safety concerns  Vitals:   08/06/22 0745 08/06/22 0745 08/06/22 0749 08/06/22 1137  BP: (!) 144/80 (!) 144/80 (!) 144/80 (!) 107/48  Pulse: 72 73 82 84  Resp: 19 19 19 16   Temp: 97.7 F (36.5 C) 97.7 F (36.5 C) 97.7 F (36.5 C) (!) 97.5 F (36.4 C)  TempSrc:    Oral  SpO2: 98% 98% 98% 94%  Weight:      Height:        Intake/Output Summary (Last 24 hours) at 08/06/2022 1357 Last data filed at 08/06/2022 1200 Gross per 24 hour  Intake 440 ml  Output 500 ml  Net -60 ml   Filed Weights   08/03/22 1335 08/04/22 1541  Weight: 84.8 kg 80.1 kg    Data Reviewed: I have personally reviewed and interpreted daily labs, tele strips, imagings as discussed above. I reviewed all nursing notes, pharmacy notes, vitals, pertinent old records I have discussed plan of care as  described above with RN and patient/family.  CBC: Recent Labs  Lab 08/03/22 1307 08/05/22 0535 08/06/22 0529  WBC 6.0 6.7 7.7  NEUTROABS 3.6  --   --   HGB 12.0* 11.1* 11.2*  HCT 35.6* 31.8* 33.1*  MCV 96.0 94.1 95.4  PLT 234 207 218   Basic Metabolic Panel: Recent Labs  Lab 08/03/22 1307 08/04/22 0706 08/05/22 0535 08/06/22 0529  NA 136  --  138 140  K 4.4  --  3.5 3.8  CL 103  --  108 110  CO2 25  --  24 25  GLUCOSE 99  --  101* 102*  BUN 18  --  27* 22  CREATININE 1.05  --  1.01 0.93  CALCIUM 9.0  --  8.5* 8.6*  MG  --  1.9 2.0 1.9  PHOS  --  3.7 3.9 3.7    Studies: No results found.  Scheduled Meds:   stroke: early stages of recovery book   Does not apply Once   allopurinol  100 mg Oral Daily   cyanocobalamin  1,000 mcg Intramuscular Daily   Followed by   Melene Muller ON 08/12/2022] vitamin B-12  1,000 mcg Oral Daily   feeding supplement  237 mL Oral TID BM   finasteride  5 mg Oral Daily   metoprolol tartrate  25 mg Oral BID   multivitamin with minerals  1 tablet Oral Daily   risperiDONE  0.5 mg Oral QHS   Vitamin D (Ergocalciferol)  50,000 Units Oral Q7 days   warfarin  5 mg Oral ONCE-1600   Warfarin - Pharmacist Dosing Inpatient   Does not apply q1600   Continuous Infusions:  cefTAZidime (FORTAZ)  IV 1 g (08/06/22 0845)   PRN Meds: acetaminophen, haloperidol lactate, metoprolol tartrate, ondansetron (ZOFRAN) IV  Time spent: 35 minutes  Author: Gillis Santa. MD [facility] 08/06/2022 1:57 PM  To reach On-call, see care teams to locate the attending and reach out to them via [email or web address]. If 7PM-7AM, please contact night-coverage If you still have difficulty reaching the attending provider, please page the [facility] (Director on Call) for [facility] on amion for assistance.

## 2022-08-06 NOTE — Consult Note (Signed)
ANTICOAGULATION CONSULT NOTE -   Pharmacy Consult for Warfarin Indication: atrial fibrillation  Allergies  Allergen Reactions   Ace Inhibitors    Penicillins Rash    Patient Measurements: Height: 5\' 10"  (177.8 cm) Weight: 80.1 kg (176 lb 9.4 oz) IBW/kg (Calculated) : 73  Vital Signs: Temp: 97.7 F (36.5 C) (05/25 0506) BP: 140/84 (05/25 0506) Pulse Rate: 73 (05/25 0506)  Labs: Recent Labs    08/03/22 1307 08/04/22 0521 08/05/22 0535 08/06/22 0529  HGB 12.0*  --  11.1* 11.2*  HCT 35.6*  --  31.8* 33.1*  PLT 234  --  207 218  APTT 37*  --   --   --   LABPROT 22.5* 20.2* 18.8* 20.5*  INR 2.0* 1.7* 1.6* 1.7*  CREATININE 1.05  --  1.01 0.93     Estimated Creatinine Clearance: 49.1 mL/min (by C-G formula based on SCr of 0.93 mg/dL).   Medical History: Past Medical History:  Diagnosis Date   Arthritis    Atrial fibrillation (HCC)    controlled on coumadin   GERD (gastroesophageal reflux disease)    Glaucoma    Gout    Hyperlipidemia    valvular heart disease    Warfarin therapy started 07/29/2014    Medications:  PTA Warfarin: 35 mg/wk 5 mg daily  Assessment: Erik Larson is a 89 y.o. male presenting with concern for CVA. PMH significant for arthritis, AF, GERD, HLD, gout. Patient was on Warfarin PTA per chart review. Last dose of Warfarin PTA was on 08/02/2022 @ 1610 (5 mg). Of note, INR was 2.0 on home regimen on arrival. Per neurology, ok to resume home warfarin. Pharmacy has been consulted to initiate and manage warfarin.    DDI: ceftazidime (increase INR)   Goal of Therapy:  INR 2-3 Monitor platelets by anticoagulation protocol: Yes   Date    INR      Warfarin Dose    5/22 2.0 5 mg (PTA)>>dose not given 5/23 1.7 6 mg 5/24 1.6 6 mg 5/25 1.7 5 mg  Plan:  INR is subtherapeutic and trending down probably due to held dose on 5/22. Will give Warfarin 5 mg x 1 (Home dose). Daily INR. CBC at least every 3 days.    Ronnald Ramp, PharmD 08/06/2022  7:21 AM

## 2022-08-07 DIAGNOSIS — G9341 Metabolic encephalopathy: Secondary | ICD-10-CM | POA: Diagnosis not present

## 2022-08-07 LAB — BASIC METABOLIC PANEL
Anion gap: 6 (ref 5–15)
BUN: 25 mg/dL — ABNORMAL HIGH (ref 8–23)
CO2: 26 mmol/L (ref 22–32)
Calcium: 8.3 mg/dL — ABNORMAL LOW (ref 8.9–10.3)
Chloride: 108 mmol/L (ref 98–111)
Creatinine, Ser: 0.94 mg/dL (ref 0.61–1.24)
GFR, Estimated: >60 mL/min (ref >60)
Glucose, Bld: 98 mg/dL (ref 70–99)
Potassium: 3.8 mmol/L (ref 3.5–5.1)
Sodium: 140 mmol/L (ref 135–145)

## 2022-08-07 LAB — CBC
HCT: 33.5 % — ABNORMAL LOW (ref 39.0–52.0)
Hemoglobin: 11.3 g/dL — ABNORMAL LOW (ref 13.0–17.0)
MCH: 32.2 pg (ref 26.0–34.0)
MCHC: 33.7 g/dL (ref 30.0–36.0)
MCV: 95.4 fL (ref 80.0–100.0)
Platelets: 221 10*3/uL (ref 150–400)
RBC: 3.51 MIL/uL — ABNORMAL LOW (ref 4.22–5.81)
RDW: 13.1 % (ref 11.5–15.5)
WBC: 7.2 10*3/uL (ref 4.0–10.5)
nRBC: 0 % (ref 0.0–0.2)

## 2022-08-07 LAB — PROTIME-INR
INR: 1.6 — ABNORMAL HIGH (ref 0.8–1.2)
Prothrombin Time: 19.7 seconds — ABNORMAL HIGH (ref 11.4–15.2)

## 2022-08-07 LAB — PHOSPHORUS: Phosphorus: 3.6 mg/dL (ref 2.5–4.6)

## 2022-08-07 LAB — GLUCOSE, CAPILLARY
Glucose-Capillary: 104 mg/dL — ABNORMAL HIGH (ref 70–99)
Glucose-Capillary: 143 mg/dL — ABNORMAL HIGH (ref 70–99)
Glucose-Capillary: 89 mg/dL (ref 70–99)
Glucose-Capillary: 91 mg/dL (ref 70–99)

## 2022-08-07 LAB — MAGNESIUM: Magnesium: 2.1 mg/dL (ref 1.7–2.4)

## 2022-08-07 MED ORDER — POLYVINYL ALCOHOL 1.4 % OP SOLN
1.0000 [drp] | Freq: Two times a day (BID) | OPHTHALMIC | Status: DC
  Administered 2022-08-07 – 2022-08-10 (×7): 1 [drp] via OPHTHALMIC
  Filled 2022-08-07: qty 15

## 2022-08-07 MED ORDER — BACITRACIN-POLYMYXIN B 500-10000 UNIT/GM OP OINT
TOPICAL_OINTMENT | Freq: Two times a day (BID) | OPHTHALMIC | Status: DC
  Administered 2022-08-09: 1 via OPHTHALMIC
  Filled 2022-08-07 (×2): qty 3.5

## 2022-08-07 MED ORDER — HALOPERIDOL LACTATE 5 MG/ML IJ SOLN
2.0000 mg | Freq: Once | INTRAMUSCULAR | Status: AC
  Administered 2022-08-07: 2 mg via INTRAVENOUS
  Filled 2022-08-07: qty 1

## 2022-08-07 MED ORDER — WARFARIN SODIUM 7.5 MG PO TABS
7.5000 mg | ORAL_TABLET | Freq: Once | ORAL | Status: AC
  Administered 2022-08-07: 7.5 mg via ORAL
  Filled 2022-08-07: qty 1

## 2022-08-07 NOTE — Consult Note (Signed)
ANTICOAGULATION CONSULT NOTE -   Pharmacy Consult for Warfarin Indication: atrial fibrillation  Allergies  Allergen Reactions   Ace Inhibitors    Penicillins Rash    Patient Measurements: Height: 5\' 10"  (177.8 cm) Weight: 80.1 kg (176 lb 9.4 oz) IBW/kg (Calculated) : 73  Vital Signs: Temp: 97.7 F (36.5 C) (05/26 0729) Temp Source: Oral (05/26 0729) BP: 131/63 (05/26 0729) Pulse Rate: 79 (05/26 0729)  Labs: Recent Labs    08/05/22 0535 08/06/22 0529 08/07/22 0431  HGB 11.1* 11.2* 11.3*  HCT 31.8* 33.1* 33.5*  PLT 207 218 221  LABPROT 18.8* 20.5* 19.7*  INR 1.6* 1.7* 1.6*  CREATININE 1.01 0.93 0.94     Estimated Creatinine Clearance: 48.5 mL/min (by C-G formula based on SCr of 0.94 mg/dL).   Medical History: Past Medical History:  Diagnosis Date   Arthritis    Atrial fibrillation (HCC)    controlled on coumadin   GERD (gastroesophageal reflux disease)    Glaucoma    Gout    Hyperlipidemia    valvular heart disease    Warfarin therapy started 07/29/2014    Medications:  PTA Warfarin: 35 mg/wk 5 mg daily  Assessment: Erik Larson is a 89 y.o. male presenting with concern for CVA. PMH significant for arthritis, AF, GERD, HLD, gout. Patient was on Warfarin PTA per chart review. Last dose of Warfarin PTA was on 08/02/2022 @ 1610 (5 mg). Of note, INR was 2.0 on home regimen on arrival. Per neurology, ok to resume home warfarin. Pharmacy has been consulted to initiate and manage warfarin.    DDI: ceftazidime (increase INR)   Goal of Therapy:  INR 2-3 Monitor platelets by anticoagulation protocol: Yes   Date    INR      Warfarin Dose    5/22 2.0 5 mg (PTA)>>dose not given 5/23 1.7 6 mg 5/24 1.6 6 mg 5/25 1.7 5 mg 5/25 1.6 7.5 mg  Plan:  INR is subtherapeutic and trending down. Will give Warfarin 7.5 mg x 1 (increased from home dose). Daily INR. CBC at least every 3 days.    Ronnald Ramp, PharmD 08/07/2022 7:42 AM

## 2022-08-07 NOTE — Progress Notes (Signed)
[facility] Progress Note  Patient: Erik Larson    AVW:[id number]  DOA: 08/03/2022     Date of Service: the patient was seen and examined on 08/07/2022  Chief Complaint  Patient presents with   Code Stroke   Brief hospital course:  Erik Larson is a 89 y.o. male with medical history significant of atrial fibrillation on Coumadin, GERD, hyperlipidemia, BPH/urinary retention status post suprapubic catheter, hypertension presenting with encephalopathy and suspected CVA.  Limited history in the setting of encephalopathy.  Patient confused and answering minimal questions.  Per report, patient with an acute change in mentation since earlier today.  Patient was noted to be acutely confused by his daughter around 100 AM.  Patient lives at a local assisted living facility.  Per report, patient usually conversive at baseline.  Had acute confusion as well as aphasia per report.  No reports of fevers or chills.  No reports of chest pain shortness of breath nausea vomiting.  Noted admission March 30 through April 2 for similar issues associated with UTI.  Had urine culture that showed Enterococcus faecalis and Corynebacterium species.  Completed course of IV ceftazidime as well as oral nitrofurantoin based on urine sensitivities.  No reports of recent change in urine pattern.  No reported abdominal pain or diarrhea. Presented to the ER afebrile, pressures 170s over 100s, satting well on room air.  White count 6.0, hemoglobin 12, platelets 234, urinalysis grossly stable apart from small leukocytes and rare bacteria, INR 2, creatinine 1.05.  Code stroke CT head as well as CTA head and neck grossly stable.  MRI of the brain pending.  Not a TNK candidate given elevated INR.   Assessment and Plan:  # Acute metabolic encephalopathy CVA ruled out, CT scan and MRI negative for any acute findings EEG: There was no seizure or seizure predisposition recorded on this study. Please note that lack of epileptiform  activity on EEG does not preclude the possibility of epilepsy.  Continue supportive care and fall precautions, continue aspiration precautions PT and OT eval, SLP eval Ambulate with assistance Neurology was consulted, recommended encephalopathy could be secondary to UTI, no further intervention.   # UTI, urine culture growing Pseudomonas Patient has chronic suprapubic Foley catheter which was exchanged on 06/30/2022 Foley catheter was replaced on 08/04/2022.  Family was educated to replace Foley catheter every 4 weeks. IR recommended to consult urology, and urology recommended to contact ICU RN to exchange Foley catheter. urine culture growing Pseudomonas [place] 60k sensitive to ceftazidime and Cipro. Diphtheroids >100k susceptibility is not available S/p ceftriaxone 1 g IV daily, switched to ceftazidime 1 g every 12 hourly, would treat with antibiotics for 7 days Plan is to switch him on Cipro for additional 4 days on discharge.  # Chronic persistent A-fib, HTN Continue to monitor on telemetry Continue Coumadin, pharmacy consulted for INR monitoring Increased Lopressor 25 mg p.o. twice daily Use IV Lopressor as needed  # History of gout, continued allopurinol # BPH, continue Proscar # Vitamin B12 deficiency: Started vitamin B12 1000 mcg IM injection daily during hospital stay, followed by oral supplement.  Follow-up PCP to repeat vitamin B12 level after 3 to 6 months. # Vitamin D deficiency: started vitamin D 50,000 units p.o. weekly, follow with PCP to repeat vitamin D level after 3 to 6 months. # Conjunctivitis, Neosporin twice daily order placed.  Use fresh tears as well for possible dry eyes  Body mass index is 29.29 kg/m.  Interventions:    Diet:  Dysphagia 3 diet DVT Prophylaxis: Therapeutic Anticoagulation with Coumadin    Advance goals of care discussion: Full code  Family Communication: family was present at bedside, at the time of interview.  Family was at bedside,  management plan discussed with patient's daughter, all question and concerns answered.   Disposition:  Pt is from LTC, admitted with AMS, due to UTI, urine culture growing Pseudomonas, patient is still on IV antibiotics, which precludes a safe discharge. Discharge to long-term care facility.  TOC consulted for discharge planning, stated that facility will accept the patient on Monday.    Subjective: No significant events overnight, patient was awake, resting comfortably, was not aware of breakfast at bedside.  Denied any complaints.  Resting comfortably. Patient's daughter was comfortable red eyes and crusting in the right eye.  No any other concerns.   Physical Exam: General: NAD, lying comfortably Appear in no distress, affect flat Eyes: PERRLA, visual impairment due to macular degeneration, legally blind ENT: Oral Mucosa Clear, moist  Neck: no JVD,  Cardiovascular: S1 and S2 Present, no Murmur,  Respiratory: good respiratory effort, Bilateral Air entry equal and Decreased, no Crackles, no wheezes Abdomen: Bowel Sound present, Soft and no tenderness,  Skin: no rashes Extremities: no Pedal edema, no calf tenderness Neurologic: without any new focal findings Gait not checked due to patient safety concerns  Vitals:   08/07/22 0003 08/07/22 0433 08/07/22 0729 08/07/22 1141  BP: (!) 147/62 138/64 131/63 124/68  Pulse: (!) 53 61 79 74  Resp: 18 18 16 16   Temp: 98.5 F (36.9 C) 98.3 F (36.8 C) 97.7 F (36.5 C) 97.7 F (36.5 C)  TempSrc:   Oral Oral  SpO2: 100% 99% 100% 100%  Weight:      Height:        Intake/Output Summary (Last 24 hours) at 08/07/2022 1351 Last data filed at 08/07/2022 0300 Gross per 24 hour  Intake 360 ml  Output 1250 ml  Net -890 ml   Filed Weights   08/03/22 1335 08/04/22 1541  Weight: 84.8 kg 80.1 kg    Data Reviewed: I have personally reviewed and interpreted daily labs, tele strips, imagings as discussed above. I reviewed all nursing notes,  pharmacy notes, vitals, pertinent old records I have discussed plan of care as described above with RN and patient/family.  CBC: Recent Labs  Lab 08/03/22 1307 08/05/22 0535 08/06/22 0529 08/07/22 0431  WBC 6.0 6.7 7.7 7.2  NEUTROABS 3.6  --   --   --   HGB 12.0* 11.1* 11.2* 11.3*  HCT 35.6* 31.8* 33.1* 33.5*  MCV 96.0 94.1 95.4 95.4  PLT 234 207 218 221   Basic Metabolic Panel: Recent Labs  Lab 08/03/22 1307 08/04/22 0706 08/05/22 0535 08/06/22 0529 08/07/22 0431  NA 136  --  138 140 140  K 4.4  --  3.5 3.8 3.8  CL 103  --  108 110 108  CO2 25  --  24 25 26   GLUCOSE 99  --  101* 102* 98  BUN 18  --  27* 22 25*  CREATININE 1.05  --  1.01 0.93 0.94  CALCIUM 9.0  --  8.5* 8.6* 8.3*  MG  --  1.9 2.0 1.9 2.1  PHOS  --  3.7 3.9 3.7 3.6    Studies: No results found.  Scheduled Meds:   stroke: early stages of recovery book   Does not apply Once   allopurinol  100 mg Oral Daily   bacitracin-polymyxin b  Both Eyes BID   cyanocobalamin  1,000 mcg Intramuscular Daily   Followed by   Melene Muller ON 08/12/2022] vitamin B-12  1,000 mcg Oral Daily   feeding supplement  237 mL Oral TID BM   finasteride  5 mg Oral Daily   metoprolol tartrate  25 mg Oral BID   multivitamin with minerals  1 tablet Oral Daily   polyvinyl alcohol  1 drop Both Eyes BID   risperiDONE  0.5 mg Oral QHS   Vitamin D (Ergocalciferol)  50,000 Units Oral Q7 days   warfarin  7.5 mg Oral ONCE-1600   Warfarin - Pharmacist Dosing Inpatient   Does not apply q1600   Continuous Infusions:  cefTAZidime (FORTAZ)  IV 1 g (08/07/22 1053)   PRN Meds: acetaminophen, haloperidol lactate, metoprolol tartrate, ondansetron (ZOFRAN) IV  Time spent: 35 minutes  Author: Gillis Santa. MD [facility] 08/07/2022 1:51 PM  To reach On-call, see care teams to locate the attending and reach out to them via [email or web address]. If 7PM-7AM, please contact night-coverage If you still have difficulty reaching the attending  provider, please page the [facility] (Director on Call) for [facility] on amion for assistance.

## 2022-08-07 NOTE — Progress Notes (Incomplete)
       CROSS COVER NOTE  NAME: DAXYN BERENDT MRN: [id number] DOB : 1924/10/29 ATTENDING PHYSICIAN: Gillis Santa, MD    Date of Service   08/07/2022   HPI/Events of Note   Report/Request ***Can I get something to help Mr. Brisco rest. He had had the prn haldol and he continues to get up and is pulling out IV's and pulling at his suprapubic cath.  On Review of chart *** Haldol PRN ordered and scheduled risperdal Bedside eval*** HPI***  Interventions   Assessment/Plan:  2 mg IV Haldol Consider increasing Risperdal X    *** professional thanks      To reach the provider On-Call:   7AM- 7PM see care teams to locate the attending and reach out to them via [email or web address]. Password: [email or web address] 7PM-7AM contact night-coverage If you still have difficulty reaching the appropriate provider, please page the [facility] (Director on Call) for [facility] on amion for assistance  This document was prepared using Conservation officer, historic buildings and may include unintentional dictation errors.  Bishop Limbo DNP, MBA, FNP-BC, PMHNP-BC Nurse Practitioner [facility] Pager [phone]

## 2022-08-07 NOTE — Progress Notes (Signed)
Physical Therapy Treatment Patient Details Name: Erik Larson MRN: [id number] DOB: Jul 29, 1924 Today's Date: 08/07/2022   History of Present Illness 89 y/o male presented to ED on 08/03/22 for AMS and aphasia. CT and MRI negative. Admitted for acute encephalopathy. PMH: glaucome, Afib, HTN    PT Comments    Pt in bed, ready to get up.  Struggled to get up without assist.  Needs mod a x 1 to complete task.  Steady in sitting.  He is able to stand min a x 1 and march in place with min guard before walking 100' with RW and min guard/assist.  Generally weak gait but no LOB or buckling.  Short shuffling steps.  Overall progressing with mobility.  Seated AROM x 10 BLE. Remained up in chair after session with needs met.   Recommendations for follow up therapy are one component of a multi-disciplinary discharge planning process, led by the attending physician.  Recommendations may be updated based on patient status, additional functional criteria and insurance authorization.  Follow Up Recommendations       Assistance Recommended at Discharge Frequent or constant Supervision/Assistance  Patient can return home with the following Assistance with cooking/housework;Assistance with feeding;Direct supervision/assist for medications management;Direct supervision/assist for financial management;Assist for transportation;A little help with walking and/or transfers;A lot of help with bathing/dressing/bathroom   Equipment Recommendations  None recommended by PT    Recommendations for Other Services       Precautions / Restrictions Precautions Precautions: Fall Restrictions Weight Bearing Restrictions: No     Mobility  Bed Mobility Overal bed mobility: Needs Assistance Bed Mobility: Supine to Sit     Supine to sit: Mod assist     General bed mobility comments: increased time and generally "stiff" movements    Transfers Overall transfer level: Needs assistance Equipment used: Rolling  walker (2 wheels) Transfers: Sit to/from Stand Sit to Stand: Min guard, Min assist           General transfer comment: increased time to complete    Ambulation/Gait Ambulation/Gait assistance: Min assist Gait Distance (Feet): 100 Feet Assistive device: Rolling walker (2 wheels) Gait Pattern/deviations: Decreased step length - right, Decreased step length - left, Step-through pattern Gait velocity: decreased     General Gait Details: short shuffling steps and generally appears weak but overall does well and better than expected today with increased distances   Social research officer, government Rankin (Stroke Patients Only)       Balance Overall balance assessment: Needs assistance Sitting-balance support: Feet supported, No upper extremity supported Sitting balance-Leahy Scale: Good     Standing balance support: Reliant on assistive device for balance, Bilateral upper extremity supported Standing balance-Leahy Scale: Fair                              Cognition Arousal/Alertness: Awake/alert Behavior During Therapy: WFL for tasks assessed/performed Overall Cognitive Status: Within Functional Limits for tasks assessed                                          Exercises Other Exercises Other Exercises: seated AROM x 10    General Comments        Pertinent Vitals/Pain Pain Assessment Pain Assessment: No/denies pain Pain Intervention(s): Monitored during session  Home Living                          Prior Function            PT Goals (current goals can now be found in the care plan section) Progress towards PT goals: Progressing toward goals    Frequency    Min 2X/week      PT Plan Current plan remains appropriate    Co-evaluation              AM-PAC PT "6 Clicks" Mobility   Outcome Measure  Help needed turning from your back to your side while in a flat bed without  using bedrails?: A Little Help needed moving from lying on your back to sitting on the side of a flat bed without using bedrails?: A Lot Help needed moving to and from a bed to a chair (including a wheelchair)?: A Little Help needed standing up from a chair using your arms (e.g., wheelchair or bedside chair)?: A Little Help needed to walk in hospital room?: A Little Help needed climbing 3-5 steps with a railing? : A Lot 6 Click Score: 16    End of Session Equipment Utilized During Treatment: Gait belt Activity Tolerance: Patient tolerated treatment well Patient left: in chair;with call bell/phone within reach;with chair alarm set;with nursing/sitter in room Nurse Communication: Mobility status PT Visit Diagnosis: Unsteadiness on feet (R26.81);Muscle weakness (generalized) (M62.81);Difficulty in walking, not elsewhere classified (R26.2)     Time: 2956-2130 PT Time Calculation (min) (ACUTE ONLY): 17 min  Charges:  $Gait Training: 8-22 mins                   Danielle Dess, PTA 08/07/22, 12:58 PM

## 2022-08-08 DIAGNOSIS — G9341 Metabolic encephalopathy: Secondary | ICD-10-CM | POA: Diagnosis not present

## 2022-08-08 LAB — BASIC METABOLIC PANEL
Anion gap: 5 (ref 5–15)
BUN: 23 mg/dL (ref 8–23)
CO2: 26 mmol/L (ref 22–32)
Calcium: 8.2 mg/dL — ABNORMAL LOW (ref 8.9–10.3)
Chloride: 106 mmol/L (ref 98–111)
Creatinine, Ser: 0.83 mg/dL (ref 0.61–1.24)
GFR, Estimated: >60 mL/min (ref >60)
Glucose, Bld: 93 mg/dL (ref 70–99)
Potassium: 3.9 mmol/L (ref 3.5–5.1)
Sodium: 137 mmol/L (ref 135–145)

## 2022-08-08 LAB — GLUCOSE, CAPILLARY
Glucose-Capillary: 115 mg/dL — ABNORMAL HIGH (ref 70–99)
Glucose-Capillary: 83 mg/dL (ref 70–99)
Glucose-Capillary: 83 mg/dL (ref 70–99)
Glucose-Capillary: 86 mg/dL (ref 70–99)

## 2022-08-08 LAB — CBC
HCT: 32.9 % — ABNORMAL LOW (ref 39.0–52.0)
Hemoglobin: 11.2 g/dL — ABNORMAL LOW (ref 13.0–17.0)
MCH: 32.4 pg (ref 26.0–34.0)
MCHC: 34 g/dL (ref 30.0–36.0)
MCV: 95.1 fL (ref 80.0–100.0)
Platelets: 218 10*3/uL (ref 150–400)
RBC: 3.46 MIL/uL — ABNORMAL LOW (ref 4.22–5.81)
RDW: 13.2 % (ref 11.5–15.5)
WBC: 6.3 10*3/uL (ref 4.0–10.5)
nRBC: 0 % (ref 0.0–0.2)

## 2022-08-08 LAB — PROTIME-INR
INR: 1.6 — ABNORMAL HIGH (ref 0.8–1.2)
Prothrombin Time: 19.1 seconds — ABNORMAL HIGH (ref 11.4–15.2)

## 2022-08-08 MED ORDER — BISACODYL 5 MG PO TBEC
10.0000 mg | DELAYED_RELEASE_TABLET | Freq: Every evening | ORAL | Status: DC | PRN
  Filled 2022-08-08: qty 2

## 2022-08-08 MED ORDER — BISACODYL 10 MG RE SUPP
10.0000 mg | Freq: Every day | RECTAL | Status: DC | PRN
  Filled 2022-08-08: qty 1

## 2022-08-08 MED ORDER — POLYETHYLENE GLYCOL 3350 17 G PO PACK
17.0000 g | PACK | Freq: Two times a day (BID) | ORAL | Status: DC | PRN
  Administered 2022-08-08: 17 g via ORAL
  Filled 2022-08-08: qty 1

## 2022-08-08 MED ORDER — BISACODYL 5 MG PO TBEC
10.0000 mg | DELAYED_RELEASE_TABLET | Freq: Once | ORAL | Status: AC
  Administered 2022-08-08: 10 mg via ORAL
  Filled 2022-08-08: qty 2

## 2022-08-08 MED ORDER — WARFARIN SODIUM 7.5 MG PO TABS
7.5000 mg | ORAL_TABLET | Freq: Once | ORAL | Status: AC
  Administered 2022-08-08: 7.5 mg via ORAL
  Filled 2022-08-08: qty 1

## 2022-08-08 MED ORDER — SODIUM CHLORIDE 0.9 % IV SOLN
1.0000 g | Freq: Three times a day (TID) | INTRAVENOUS | Status: DC
  Administered 2022-08-08 – 2022-08-10 (×6): 1 g via INTRAVENOUS
  Filled 2022-08-08 (×7): qty 1

## 2022-08-08 NOTE — TOC Progression Note (Signed)
Transition of Care [facility]) - Progression Note    Patient Details  Name: Erik Larson MRN: [id number] Date of Birth: 1925-01-27  Transition of Care [facility]) CM/SW Contact  Allena Katz, LCSW Phone Number: 08/08/2022, 1:43 PM  Clinical Narrative:   Palliative consult ordered. TOC following for updates.     Expected Discharge Plan: Assisted Living (with home health) Barriers to Discharge: Continued Medical Work up  Expected Discharge Plan and Services     Post Acute Care Choice: Resumption of Svcs/PTA Provider Living arrangements for the past 2 months: Assisted Living Facility                           HH Arranged: RN, PT, OT [facility] Agency: [facility] Date [facility] Agency Contacted: 08/05/22   Representative spoke with at [facility] Agency: Becky Sax   Social Determinants of Health (SDOH) Interventions SDOH Screenings   Food Insecurity: No Food Insecurity (08/04/2022)  Housing: Low Risk  (08/04/2022)  Transportation Needs: No Transportation Needs (08/04/2022)  Utilities: Not At Risk (08/04/2022)  Financial Resource Strain: Low Risk  (07/12/2017)  Physical Activity: Inactive (07/12/2017)  Social Connections: Unknown (07/12/2017)  Stress: No Stress Concern Present (07/12/2017)  Tobacco Use: Medium Risk (08/04/2022)    Readmission Risk Interventions    08/05/2022    9:53 AM  Readmission Risk Prevention Plan  PCP or Specialist Appt within 3-5 Days Complete  HRI or Home Care Consult Complete  Social Work Consult for Recovery Care Planning/Counseling Complete  Palliative Care Screening Complete

## 2022-08-08 NOTE — Consult Note (Signed)
ANTICOAGULATION CONSULT NOTE -   Pharmacy Consult for Warfarin Indication: atrial fibrillation  Allergies  Allergen Reactions   Ace Inhibitors    Penicillins Rash    Patient Measurements: Height: 5\' 10"  (177.8 cm) Weight: 80.1 kg (176 lb 9.4 oz) IBW/kg (Calculated) : 73  Vital Signs: Temp: 98.3 F (36.8 C) (05/27 0756) BP: 152/73 (05/27 0756) Pulse Rate: 77 (05/27 0756)  Labs: Recent Labs    08/06/22 0529 08/07/22 0431 08/08/22 0457  HGB 11.2* 11.3* 11.2*  HCT 33.1* 33.5* 32.9*  PLT 218 221 218  LABPROT 20.5* 19.7* 19.1*  INR 1.7* 1.6* 1.6*  CREATININE 0.93 0.94 0.83     Estimated Creatinine Clearance: 55 mL/min (by C-G formula based on SCr of 0.83 mg/dL).   Medical History: Past Medical History:  Diagnosis Date   Arthritis    Atrial fibrillation (HCC)    controlled on coumadin   GERD (gastroesophageal reflux disease)    Glaucoma    Gout    Hyperlipidemia    valvular heart disease    Warfarin therapy started 07/29/2014    Medications:  PTA Warfarin: 35 mg/wk 5 mg daily  Assessment: ROWAN BOUWKAMP is a 89 y.o. male presenting with concern for CVA. PMH significant for arthritis, AF, GERD, HLD, gout. Patient was on Warfarin PTA per chart review. Last dose of Warfarin PTA was on 08/02/2022 @ 1610 (5 mg). Of note, INR was 2.0 on home regimen on arrival. Per neurology, ok to resume home warfarin. Pharmacy has been consulted to initiate and manage warfarin.    DDI: ceftazidime (increase INR)   Goal of Therapy:  INR 2-3 Monitor platelets by anticoagulation protocol: Yes   Date    INR      Warfarin Dose    5/22 2.0 5 mg (PTA)>>dose not given 5/23 1.7 6 mg 5/24 1.6 6 mg 5/25 1.7 5 mg 5/26 1.6 7.5 mg 5/27 1.6  Plan:  INR is subtherapeutic  Will give Warfarin 7.5 mg x 1 again(increased from home dose). Daily INR. CBC at least every 3 days.    Conroy Goracke A, PharmD 08/08/2022 10:53 AM

## 2022-08-08 NOTE — Progress Notes (Signed)
[facility] Progress Note  Patient: Erik Larson    ZOX:[id number]  DOA: 08/03/2022     Date of Service: the patient was seen and examined on 08/08/2022  Chief Complaint  Patient presents with   Code Stroke   Brief hospital course:  Erik Larson is a 89 y.o. male with medical history significant of atrial fibrillation on Coumadin, GERD, hyperlipidemia, BPH/urinary retention status post suprapubic catheter, hypertension presenting with encephalopathy and suspected CVA.  Limited history in the setting of encephalopathy.  Patient confused and answering minimal questions.  Per report, patient with an acute change in mentation since earlier today.  Patient was noted to be acutely confused by his daughter around 82 AM.  Patient lives at a local assisted living facility.  Per report, patient usually conversive at baseline.  Had acute confusion as well as aphasia per report.  No reports of fevers or chills.  No reports of chest pain shortness of breath nausea vomiting.  Noted admission March 30 through April 2 for similar issues associated with UTI.  Had urine culture that showed Enterococcus faecalis and Corynebacterium species.  Completed course of IV ceftazidime as well as oral nitrofurantoin based on urine sensitivities.  No reports of recent change in urine pattern.  No reported abdominal pain or diarrhea. Presented to the ER afebrile, pressures 170s over 100s, satting well on room air.  White count 6.0, hemoglobin 12, platelets 234, urinalysis grossly stable apart from small leukocytes and rare bacteria, INR 2, creatinine 1.05.  Code stroke CT head as well as CTA head and neck grossly stable.  MRI of the brain pending.  Not a TNK candidate given elevated INR.   Assessment and Plan:  # Acute metabolic encephalopathy CVA ruled out, CT scan and MRI negative for any acute findings EEG: There was no seizure or seizure predisposition recorded on this study. Please note that lack of epileptiform  activity on EEG does not preclude the possibility of epilepsy.  Continue supportive care and fall precautions, continue aspiration precautions PT and OT eval, SLP eval Ambulate with assistance Neurology was consulted, recommended encephalopathy could be secondary to UTI, no further intervention. Continue Risperdal 0.5 mg nightly, patient is still having agitation intermittently, Haldol as needed will be used.   # UTI, urine culture growing Pseudomonas Patient has chronic suprapubic Foley catheter which was exchanged on 06/30/2022 Foley catheter was replaced on 08/04/2022.  Family was educated to replace Foley catheter every 4 weeks. IR recommended to consult urology, and urology recommended to contact ICU RN to exchange Foley catheter. urine culture growing Pseudomonas Putida 60k sensitive to ceftazidime and Cipro. Diphtheroids >100k susceptibility is not available S/p ceftriaxone 1 g IV daily, switched to ceftazidime 1 g every 12 hourly, would treat with antibiotics for 7 days Plan is to switch him on Cipro on discharge to complete 7 days of antibiotics.  # Chronic persistent A-fib, HTN Continue to monitor on telemetry Continue Coumadin, pharmacy consulted for INR monitoring Increased Lopressor 25 mg p.o. twice daily Use IV Lopressor as needed  # History of gout, continued allopurinol # BPH, continue Proscar # Vitamin B12 deficiency: Started vitamin B12 1000 mcg IM injection daily during hospital stay, followed by oral supplement.  Follow-up PCP to repeat vitamin B12 level after 3 to 6 months. # Vitamin D deficiency: started vitamin D 50,000 units p.o. weekly, follow with PCP to repeat vitamin D level after 3 to 6 months. # Conjunctivitis, Neosporin twice daily order placed.  Use fresh tears  as well for possible dry eyes # Constipation, started MiraLAX twice daily and Dulcolax as needed.  Body mass index is 29.29 kg/m.  Interventions:    Diet: Dysphagia 3 diet DVT Prophylaxis:  Therapeutic Anticoagulation with Coumadin    Advance goals of care discussion: Full code  Family Communication: family was present at bedside, at the time of interview.  Family was at bedside, management plan discussed with patient's daughter, all question and concerns answered.   Disposition:  Pt is from ALF, admitted with AMS, due to UTI, urine culture growing Pseudomonas, patient is still on IV antibiotics, which precludes a safe discharge. Discharge to long-term care facility, palliative care consult is pending.  TOC consulted for discharge planning  Subjective: Overnight patient was agitated and he was pulling IV line and Foley catheter.  Haldol was given.  In the morning patient was resting comfortably, stated that he is feeling fine.  Denied any complaints.    Physical Exam: General: NAD, lying comfortably Appear in no distress, affect flat Eyes: PERRLA, visual impairment due to macular degeneration, legally blind ENT: Oral Mucosa Clear, moist  Neck: no JVD,  Cardiovascular: S1 and S2 Present, no Murmur,  Respiratory: good respiratory effort, Bilateral Air entry equal and Decreased, no Crackles, no wheezes Abdomen: Bowel Sound present, Soft and no tenderness,  Skin: no rashes Extremities: no Pedal edema, no calf tenderness Neurologic: without any new focal findings Gait not checked due to patient safety concerns  Vitals:   08/08/22 0524 08/08/22 0756 08/08/22 1057 08/08/22 1144  BP: 123/60 (!) 152/73  (!) 117/40  Pulse: 72 77 (!) 56 71  Resp: 18 17  18   Temp: 98.5 F (36.9 C) 98.3 F (36.8 C)    TempSrc:      SpO2: 100% 100%  100%  Weight:      Height:        Intake/Output Summary (Last 24 hours) at 08/08/2022 1415 Last data filed at 08/08/2022 1100 Gross per 24 hour  Intake 780 ml  Output 1400 ml  Net -620 ml   Filed Weights   08/03/22 1335 08/04/22 1541  Weight: 84.8 kg 80.1 kg    Data Reviewed: I have personally reviewed and interpreted daily labs, tele  strips, imagings as discussed above. I reviewed all nursing notes, pharmacy notes, vitals, pertinent old records I have discussed plan of care as described above with RN and patient/family.  CBC: Recent Labs  Lab 08/03/22 1307 08/05/22 0535 08/06/22 0529 08/07/22 0431 08/08/22 0457  WBC 6.0 6.7 7.7 7.2 6.3  NEUTROABS 3.6  --   --   --   --   HGB 12.0* 11.1* 11.2* 11.3* 11.2*  HCT 35.6* 31.8* 33.1* 33.5* 32.9*  MCV 96.0 94.1 95.4 95.4 95.1  PLT 234 207 218 221 218   Basic Metabolic Panel: Recent Labs  Lab 08/03/22 1307 08/04/22 0706 08/05/22 0535 08/06/22 0529 08/07/22 0431 08/08/22 0457  NA 136  --  138 140 140 137  K 4.4  --  3.5 3.8 3.8 3.9  CL 103  --  108 110 108 106  CO2 25  --  24 25 26 26   GLUCOSE 99  --  101* 102* 98 93  BUN 18  --  27* 22 25* 23  CREATININE 1.05  --  1.01 0.93 0.94 0.83  CALCIUM 9.0  --  8.5* 8.6* 8.3* 8.2*  MG  --  1.9 2.0 1.9 2.1  --   PHOS  --  3.7 3.9 3.7 3.6  --  Studies: No results found.  Scheduled Meds:   stroke: early stages of recovery book   Does not apply Once   allopurinol  100 mg Oral Daily   bacitracin-polymyxin b   Both Eyes BID   cyanocobalamin  1,000 mcg Intramuscular Daily   Followed by   Melene Muller ON 08/12/2022] vitamin B-12  1,000 mcg Oral Daily   feeding supplement  237 mL Oral TID BM   finasteride  5 mg Oral Daily   metoprolol tartrate  25 mg Oral BID   multivitamin with minerals  1 tablet Oral Daily   polyvinyl alcohol  1 drop Both Eyes BID   risperiDONE  0.5 mg Oral QHS   Vitamin D (Ergocalciferol)  50,000 Units Oral Q7 days   warfarin  7.5 mg Oral ONCE-1600   Warfarin - Pharmacist Dosing Inpatient   Does not apply q1600   Continuous Infusions:  cefTAZidime (FORTAZ)  IV 1 g (08/08/22 1410)   PRN Meds: acetaminophen, bisacodyl, bisacodyl, haloperidol lactate, metoprolol tartrate, ondansetron (ZOFRAN) IV, polyethylene glycol  Time spent: 35 minutes  Author: Gillis Santa. MD [facility] 08/08/2022  2:15 PM  To reach On-call, see care teams to locate the attending and reach out to them via [email or web address]. If 7PM-7AM, please contact night-coverage If you still have difficulty reaching the attending provider, please page the [facility] (Director on Call) for [facility] on amion for assistance.

## 2022-08-08 NOTE — Care Management Important Message (Signed)
Important Message  Patient Details  Name: Erik Larson MRN: [id number] Date of Birth: 25-Jan-1925   Medicare Important Message Given:  Yes  Patient asleep upon time of visit, no family in room.  Copy of Medicare IM left in room for reference.    Johnell Comings 08/08/2022, 10:42 AM

## 2022-08-09 DIAGNOSIS — G9341 Metabolic encephalopathy: Secondary | ICD-10-CM | POA: Diagnosis not present

## 2022-08-09 LAB — CBC
HCT: 35 % — ABNORMAL LOW (ref 39.0–52.0)
Hemoglobin: 11.6 g/dL — ABNORMAL LOW (ref 13.0–17.0)
MCH: 31.6 pg (ref 26.0–34.0)
MCHC: 33.1 g/dL (ref 30.0–36.0)
MCV: 95.4 fL (ref 80.0–100.0)
Platelets: 238 10*3/uL (ref 150–400)
RBC: 3.67 MIL/uL — ABNORMAL LOW (ref 4.22–5.81)
RDW: 12.9 % (ref 11.5–15.5)
WBC: 6.4 10*3/uL (ref 4.0–10.5)
nRBC: 0 % (ref 0.0–0.2)

## 2022-08-09 LAB — BASIC METABOLIC PANEL
Anion gap: 10 (ref 5–15)
BUN: 21 mg/dL (ref 8–23)
CO2: 25 mmol/L (ref 22–32)
Calcium: 8.7 mg/dL — ABNORMAL LOW (ref 8.9–10.3)
Chloride: 104 mmol/L (ref 98–111)
Creatinine, Ser: 0.9 mg/dL (ref 0.61–1.24)
GFR, Estimated: >60 mL/min (ref >60)
Glucose, Bld: 118 mg/dL — ABNORMAL HIGH (ref 70–99)
Potassium: 3.8 mmol/L (ref 3.5–5.1)
Sodium: 139 mmol/L (ref 135–145)

## 2022-08-09 LAB — GLUCOSE, CAPILLARY
Glucose-Capillary: 119 mg/dL — ABNORMAL HIGH (ref 70–99)
Glucose-Capillary: 78 mg/dL (ref 70–99)
Glucose-Capillary: 89 mg/dL (ref 70–99)
Glucose-Capillary: 97 mg/dL (ref 70–99)

## 2022-08-09 LAB — PROTIME-INR
INR: 1.7 — ABNORMAL HIGH (ref 0.8–1.2)
Prothrombin Time: 20.2 seconds — ABNORMAL HIGH (ref 11.4–15.2)

## 2022-08-09 MED ORDER — WARFARIN SODIUM 7.5 MG PO TABS
7.5000 mg | ORAL_TABLET | Freq: Once | ORAL | Status: AC
  Administered 2022-08-09: 7.5 mg via ORAL
  Filled 2022-08-09: qty 1

## 2022-08-09 MED ORDER — POTASSIUM CHLORIDE CRYS ER 20 MEQ PO TBCR
20.0000 meq | EXTENDED_RELEASE_TABLET | Freq: Once | ORAL | Status: AC
  Administered 2022-08-09: 20 meq via ORAL
  Filled 2022-08-09: qty 1

## 2022-08-09 NOTE — Progress Notes (Signed)
[facility] Progress Note  Patient: Erik Larson    ZOX:[id number]  DOA: 08/03/2022     Date of Service: the patient was seen and examined on 08/09/2022  Chief Complaint  Patient presents with   Code Stroke   Brief hospital course:  KAORU Larson is a 89 y.o. male with medical history significant of atrial fibrillation on Coumadin, GERD, hyperlipidemia, BPH/urinary retention status post suprapubic catheter, hypertension presenting with encephalopathy and suspected CVA.  Limited history in the setting of encephalopathy.  Patient confused and answering minimal questions.  Per report, patient with an acute change in mentation since earlier today.  Patient was noted to be acutely confused by his daughter around 59 AM.  Patient lives at a local assisted living facility.  Per report, patient usually conversive at baseline.  Had acute confusion as well as aphasia per report.  No reports of fevers or chills.  No reports of chest pain shortness of breath nausea vomiting.  Noted admission March 30 through April 2 for similar issues associated with UTI.  Had urine culture that showed Enterococcus faecalis and Corynebacterium species.  Completed course of IV ceftazidime as well as oral nitrofurantoin based on urine sensitivities.  No reports of recent change in urine pattern.  No reported abdominal pain or diarrhea. Presented to the ER afebrile, pressures 170s over 100s, satting well on room air.  White count 6.0, hemoglobin 12, platelets 234, urinalysis grossly stable apart from small leukocytes and rare bacteria, INR 2, creatinine 1.05.  Code stroke CT head as well as CTA head and neck grossly stable.  MRI of the brain pending.  Not a TNK candidate given elevated INR.   Assessment and Plan:  # Acute metabolic encephalopathy CVA ruled out, CT scan and MRI negative for any acute findings EEG: There was no seizure or seizure predisposition recorded on this study. Please note that lack of epileptiform  activity on EEG does not preclude the possibility of epilepsy.  Continue supportive care and fall precautions, continue aspiration precautions PT and OT eval, SLP eval Ambulate with assistance Neurology was consulted, recommended encephalopathy could be secondary to UTI, no further intervention. Continue Risperdal 0.5 mg nightly, patient is still having agitation intermittently, Haldol as needed will be used.   # UTI, urine culture growing Pseudomonas Patient has chronic suprapubic Foley catheter which was exchanged on 06/30/2022 Foley catheter was replaced on 08/04/2022.  Family was educated to replace Foley catheter every 4 weeks. IR recommended to consult urology, and urology recommended to contact ICU RN to exchange Foley catheter. urine culture growing Pseudomonas Putida 60k sensitive to ceftazidime and Cipro. Diphtheroids >100k susceptibility is not available S/p ceftriaxone 1 g IV daily, switched to ceftazidime 1 g every 12 hourly, would treat with antibiotics for 7 days Plan is to switch him on Cipro on discharge to complete 7 days of antibiotics.  # Chronic persistent A-fib, HTN Continue to monitor on telemetry Continue Coumadin, pharmacy consulted for INR monitoring Increased Lopressor 25 mg p.o. twice daily Use IV Lopressor as needed  # History of gout, continued allopurinol # BPH, continue Proscar # Vitamin B12 deficiency: Started vitamin B12 1000 mcg IM injection daily during hospital stay, followed by oral supplement.  Follow-up PCP to repeat vitamin B12 level after 3 to 6 months. # Vitamin D deficiency: started vitamin D 50,000 units p.o. weekly, follow with PCP to repeat vitamin D level after 3 to 6 months. # Conjunctivitis, Neosporin twice daily x 5 days till 5/30.  Use fresh tears as well for possible dry eyes # Constipation, started MiraLAX twice daily and Dulcolax as needed.  Body mass index is 29.29 kg/m.  Interventions:    Diet: Dysphagia 3 diet DVT Prophylaxis:  Therapeutic Anticoagulation with Coumadin    Advance goals of care discussion: Full code  Family Communication: family was Not present at bedside, at the time of interview.  Family was at bedside, management plan discussed with patient's daughter, all question and concerns answered.   Disposition:  Pt is from ALF, admitted with AMS, due to UTI, urine culture growing Pseudomonas, patient is still on IV antibiotics, but stable to discharge, awaiting for placement.   Discharge to long-term care facility, palliative care consult is pending.  TOC consulted for discharge planning  Subjective: No significant events overnight, patient was resting comfortably in the recliner.  Stated that he is feeling fine, no active issues.  Physical Exam: General: NAD, lying comfortably Appear in no distress, affect flat Eyes: PERRLA, visual impairment due to macular degeneration, legally blind ENT: Oral Mucosa Clear, moist  Neck: no JVD,  Cardiovascular: S1 and S2 Present, no Murmur,  Respiratory: good respiratory effort, Bilateral Air entry equal and Decreased, no Crackles, no wheezes Abdomen: Bowel Sound present, Soft and no tenderness,  Skin: no rashes Extremities: no Pedal edema, no calf tenderness Neurologic: without any new focal findings Gait not checked due to patient safety concerns  Vitals:   08/09/22 0427 08/09/22 0713 08/09/22 0730 08/09/22 0944  BP: 119/63 124/72  125/62  Pulse: 74 69  68  Resp: 16 17    Temp: (!) 97.4 F (36.3 C) (!) 97.5 F (36.4 C) 97.8 F (36.6 C)   TempSrc:  Oral Oral   SpO2: 96% 100%    Weight:      Height:        Intake/Output Summary (Last 24 hours) at 08/09/2022 1453 Last data filed at 08/09/2022 1016 Gross per 24 hour  Intake 557.81 ml  Output 950 ml  Net -392.19 ml   Filed Weights   08/03/22 1335 08/04/22 1541  Weight: 84.8 kg 80.1 kg    Data Reviewed: I have personally reviewed and interpreted daily labs, tele strips, imagings as discussed  above. I reviewed all nursing notes, pharmacy notes, vitals, pertinent old records I have discussed plan of care as described above with RN and patient/family.  CBC: Recent Labs  Lab 08/03/22 1307 08/05/22 0535 08/06/22 0529 08/07/22 0431 08/08/22 0457 08/09/22 0437  WBC 6.0 6.7 7.7 7.2 6.3 6.4  NEUTROABS 3.6  --   --   --   --   --   HGB 12.0* 11.1* 11.2* 11.3* 11.2* 11.6*  HCT 35.6* 31.8* 33.1* 33.5* 32.9* 35.0*  MCV 96.0 94.1 95.4 95.4 95.1 95.4  PLT 234 207 218 221 218 238   Basic Metabolic Panel: Recent Labs  Lab 08/04/22 0706 08/05/22 0535 08/06/22 0529 08/07/22 0431 08/08/22 0457 08/09/22 0437  NA  --  138 140 140 137 139  K  --  3.5 3.8 3.8 3.9 3.8  CL  --  108 110 108 106 104  CO2  --  24 25 26 26 25   GLUCOSE  --  101* 102* 98 93 118*  BUN  --  27* 22 25* 23 21  CREATININE  --  1.01 0.93 0.94 0.83 0.90  CALCIUM  --  8.5* 8.6* 8.3* 8.2* 8.7*  MG 1.9 2.0 1.9 2.1  --   --   PHOS 3.7 3.9 3.7  3.6  --   --     Studies: No results found.  Scheduled Meds:   stroke: early stages of recovery book   Does not apply Once   allopurinol  100 mg Oral Daily   bacitracin-polymyxin b   Both Eyes BID   cyanocobalamin  1,000 mcg Intramuscular Daily   Followed by   Melene Muller ON 08/12/2022] vitamin B-12  1,000 mcg Oral Daily   feeding supplement  237 mL Oral TID BM   finasteride  5 mg Oral Daily   metoprolol tartrate  25 mg Oral BID   multivitamin with minerals  1 tablet Oral Daily   polyvinyl alcohol  1 drop Both Eyes BID   risperiDONE  0.5 mg Oral QHS   Vitamin D (Ergocalciferol)  50,000 Units Oral Q7 days   warfarin  7.5 mg Oral ONCE-1600   Warfarin - Pharmacist Dosing Inpatient   Does not apply q1600   Continuous Infusions:  cefTAZidime (FORTAZ)  IV 1 g (08/09/22 1400)   PRN Meds: acetaminophen, bisacodyl, bisacodyl, haloperidol lactate, metoprolol tartrate, ondansetron (ZOFRAN) IV, polyethylene glycol  Time spent: 35 minutes  Author: Gillis Santa. MD [facility] 08/09/2022 2:53 PM  To reach On-call, see care teams to locate the attending and reach out to them via [email or web address]. If 7PM-7AM, please contact night-coverage If you still have difficulty reaching the attending provider, please page the [facility] (Director on Call) for [facility] on amion for assistance.

## 2022-08-09 NOTE — Progress Notes (Signed)
Physical Therapy Treatment Patient Details Name: Erik Larson MRN: [id number] DOB: [birth year] Today's Date: 08/09/2022   History of Present Illness 89 y/o male presented to ED on 08/03/22 for AMS and aphasia. CT and MRI negative. Admitted for acute encephalopathy. PMH: glaucome, Afib, HTN    PT Comments    Pt in bed trying to get legs out of bed.  When asked he stated he was trying to get into bed.  Assisted to to EOB and he is able to stand for standing exercises and transfer to recliner with min a x 1.  He does want to walk and +2 is called for safety as he has IV running and with low vision and needing assist to manage walker to avoid objects and for turns.  Opts to return to bed after session.   Recommendations for follow up therapy are one component of a multi-disciplinary discharge planning process, led by the attending physician.  Recommendations may be updated based on patient status, additional functional criteria and insurance authorization.  Follow Up Recommendations       Assistance Recommended at Discharge Frequent or constant Supervision/Assistance  Patient can return home with the following Assistance with cooking/housework;Assistance with feeding;Direct supervision/assist for medications management;Direct supervision/assist for financial management;Assist for transportation;A little help with walking and/or transfers;A lot of help with bathing/dressing/bathroom   Equipment Recommendations  None recommended by PT    Recommendations for Other Services       Precautions / Restrictions Precautions Precautions: Fall Restrictions Weight Bearing Restrictions: No     Mobility  Bed Mobility Overal bed mobility: Needs Assistance Bed Mobility: Supine to Sit, Sit to Supine     Supine to sit: Min assist Sit to supine: Mod assist        Transfers Overall transfer level: Needs assistance Equipment used: Rolling walker (2 wheels) Transfers: Sit to/from Stand Sit to  Stand: Min assist                Ambulation/Gait Ambulation/Gait assistance: Min assist Gait Distance (Feet): 180 Feet Assistive device: Rolling walker (2 wheels) Gait Pattern/deviations: Decreased step length - right, Decreased step length - left, Step-through pattern Gait velocity: decreased     General Gait Details: +2 for safey due to longer distances and need to manage walker   Stairs             Wheelchair Mobility    Modified Rankin (Stroke Patients Only)       Balance Overall balance assessment: Needs assistance Sitting-balance support: Feet supported, No upper extremity supported Sitting balance-Leahy Scale: Good     Standing balance support: During functional activity, Single extremity supported Standing balance-Leahy Scale: Fair                              Cognition Arousal/Alertness: Awake/alert Behavior During Therapy: Restless Overall Cognitive Status: Difficult to assess                                          Exercises      General Comments        Pertinent Vitals/Pain Pain Assessment Pain Assessment: No/denies pain Pain Intervention(s): Repositioned    Home Living                          Prior Function  PT Goals (current goals can now be found in the care plan section) Progress towards PT goals: Progressing toward goals    Frequency    Min 2X/week      PT Plan Current plan remains appropriate    Co-evaluation              AM-PAC PT "6 Clicks" Mobility   Outcome Measure  Help needed turning from your back to your side while in a flat bed without using bedrails?: A Little Help needed moving from lying on your back to sitting on the side of a flat bed without using bedrails?: A Lot Help needed moving to and from a bed to a chair (including a wheelchair)?: A Little Help needed standing up from a chair using your arms (e.g., wheelchair or bedside chair)?: A  Little Help needed to walk in hospital room?: A Little Help needed climbing 3-5 steps with a railing? : A Lot 6 Click Score: 16    End of Session Equipment Utilized During Treatment: Gait belt Activity Tolerance: Patient tolerated treatment well Patient left: in bed;with call bell/phone within reach;with bed alarm set Nurse Communication: Mobility status PT Visit Diagnosis: Unsteadiness on feet (R26.81);Muscle weakness (generalized) (M62.81);Difficulty in walking, not elsewhere classified (R26.2)     Time: 9147-8295 PT Time Calculation (min) (ACUTE ONLY): 17 min  Charges:  $Gait Training: 8-22 mins                    Danielle Dess, PTA 08/09/22, 3:35 PM

## 2022-08-09 NOTE — Consult Note (Signed)
ANTICOAGULATION CONSULT NOTE -   Pharmacy Consult for Warfarin Indication: atrial fibrillation  Allergies  Allergen Reactions   Ace Inhibitors    Penicillins Rash    Patient Measurements: Height: 5\' 10"  (177.8 cm) Weight: 80.1 kg (176 lb 9.4 oz) IBW/kg (Calculated) : 73  Vital Signs: Temp: 97.5 F (36.4 C) (05/28 0713) Temp Source: Oral (05/28 0713) BP: 124/72 (05/28 0713) Pulse Rate: 69 (05/28 0713)  Labs: Recent Labs    08/07/22 0431 08/08/22 0457 08/09/22 0437  HGB 11.3* 11.2* 11.6*  HCT 33.5* 32.9* 35.0*  PLT 221 218 238  LABPROT 19.7* 19.1* 20.2*  INR 1.6* 1.6* 1.7*  CREATININE 0.94 0.83 0.90     Estimated Creatinine Clearance: 50.7 mL/min (by C-G formula based on SCr of 0.9 mg/dL).   Medical History: Past Medical History:  Diagnosis Date   Arthritis    Atrial fibrillation (HCC)    controlled on coumadin   GERD (gastroesophageal reflux disease)    Glaucoma    Gout    Hyperlipidemia    valvular heart disease    Warfarin therapy started 07/29/2014    Medications:  PTA Warfarin: 35 mg/wk 5 mg daily  Assessment: Erik Larson is a 89 y.o. male presenting with concern for CVA. PMH significant for arthritis, AF, GERD, HLD, gout. Patient was on Warfarin PTA per chart review. Last dose of Warfarin PTA was on 08/02/2022 @ 1610 (5 mg). Of note, INR was 2.0 on home regimen on arrival. Per neurology, ok to resume home warfarin. Pharmacy has been consulted to initiate and manage warfarin.    DDI: ceftazidime (increase INR), allopurinol (home med)   Goal of Therapy:  INR 2-3 Monitor platelets by anticoagulation protocol: Yes   Date    INR      Warfarin Dose    5/22 2.0 5 mg (PTA)>>dose not given 5/23 1.7 6 mg 5/24 1.6 6 mg 5/25 1.7 5 mg 5/26 1.6 7.5 mg 5/27 1.6 7.5 mg 5/28 1.7  Plan:  INR is subtherapeutic but increased slightly.  Will give Warfarin 7.5 mg x 1 again(increased from home dose).  Daily INR.  CBC at least every 3 days.     Ameris Akamine A, PharmD 08/09/2022 7:29 AM

## 2022-08-09 NOTE — Progress Notes (Signed)
Occupational Therapy Treatment Patient Details Name: Erik Larson MRN: [id number] DOB: [birth year] Today's Date: 08/09/2022   History of present illness 89 y/o male presented to ED on 08/03/22 for AMS and aphasia. CT and MRI negative. Admitted for acute encephalopathy. PMH: glaucome, Afib, HTN   OT comments  Erik Larson was seen for OT treatment on this date. Upon arrival to room pt seated in chair, agreeable to tx. Pt requires CGA + SETUP grooming standing sink side, limited by baseline vision deficits. CGA + RW for ADL t/f ~100 ft, requires intermittent assist for RW mgmt r/t vision deficits. Pt making good progress toward goals, will continue to follow POC. Discharge recommendation remains appropriate.     Recommendations for follow up therapy are one component of a multi-disciplinary discharge planning process, led by the attending physician.  Recommendations may be updated based on patient status, additional functional criteria and insurance authorization.    Assistance Recommended at Discharge Frequent or constant Supervision/Assistance  Patient can return home with the following  A little help with walking and/or transfers;A lot of help with bathing/dressing/bathroom;Assistance with cooking/housework;Assistance with feeding;Assist for transportation;Help with stairs or ramp for entrance   Equipment Recommendations  None recommended by OT    Recommendations for Other Services      Precautions / Restrictions Precautions Precautions: Fall Restrictions Weight Bearing Restrictions: No       Mobility Bed Mobility               General bed mobility comments: not tested    Transfers Overall transfer level: Needs assistance Equipment used: Rolling walker (2 wheels) Transfers: Sit to/from Stand Sit to Stand: Min guard                 Balance Overall balance assessment: Needs assistance Sitting-balance support: Feet supported, No upper extremity supported Sitting  balance-Leahy Scale: Good     Standing balance support: During functional activity, Single extremity supported Standing balance-Leahy Scale: Fair                             ADL either performed or assessed with clinical judgement   ADL Overall ADL's : Needs assistance/impaired                                       General ADL Comments: CGA + SETUP grooming standing sink side, limited by baseline vision deficits. CGA + RW for ADL t/f ~100 ft, requires intermittent assist for RW mgmt r/t vision deficits      Cognition Arousal/Alertness: Awake/alert Behavior During Therapy: WFL for tasks assessed/performed Overall Cognitive Status: Within Functional Limits for tasks assessed                                                     Pertinent Vitals/ Pain       Pain Assessment Pain Assessment: No/denies pain   Frequency  Min 1X/week        Progress Toward Goals  OT Goals(current goals can now be found in the care plan section)  Progress towards OT goals: Progressing toward goals  Acute Rehab OT Goals Patient Stated Goal: to go home OT Goal Formulation: With patient/family Time For Goal Achievement: 08/18/22  Potential to Achieve Goals: Good ADL Goals Pt Will Perform Grooming: sitting;with set-up;with supervision Pt Will Perform Lower Body Dressing: sit to/from stand;with min assist;with caregiver independent in assisting Pt Will Transfer to Toilet: bedside commode;ambulating;with supervision;with set-up Pt Will Perform Toileting - Clothing Manipulation and hygiene: sit to/from stand;with supervision;with set-up;with caregiver independent in assisting  Plan Discharge plan remains appropriate;Frequency remains appropriate    Co-evaluation                 AM-PAC OT "6 Clicks" Daily Activity     Outcome Measure   Help from another person eating meals?: None Help from another person taking care of personal grooming?:  A Little Help from another person toileting, which includes using toliet, bedpan, or urinal?: A Lot Help from another person bathing (including washing, rinsing, drying)?: A Little Help from another person to put on and taking off regular upper body clothing?: A Little Help from another person to put on and taking off regular lower body clothing?: A Lot 6 Click Score: 17    End of Session    OT Visit Diagnosis: Other abnormalities of gait and mobility (R26.89);Muscle weakness (generalized) (M62.81);Other symptoms and signs involving cognitive function   Activity Tolerance Patient tolerated treatment well   Patient Left in chair;with call bell/phone within reach;with chair alarm set   Nurse Communication          Time: 4098-1191 OT Time Calculation (min): 15 min  Charges: OT General Charges $OT Visit: 1 Visit OT Treatments $Self Care/Home Management : 8-22 mins  Kathie Dike, M.S. OTR/L  08/09/22, 12:28 PM  ascom [phone]

## 2022-08-10 DIAGNOSIS — Z7189 Other specified counseling: Secondary | ICD-10-CM | POA: Diagnosis not present

## 2022-08-10 DIAGNOSIS — G9341 Metabolic encephalopathy: Secondary | ICD-10-CM | POA: Diagnosis not present

## 2022-08-10 DIAGNOSIS — Z515 Encounter for palliative care: Secondary | ICD-10-CM | POA: Diagnosis not present

## 2022-08-10 LAB — PROTIME-INR
INR: 1.7 — ABNORMAL HIGH (ref 0.8–1.2)
Prothrombin Time: 19.8 seconds — ABNORMAL HIGH (ref 11.4–15.2)

## 2022-08-10 LAB — BASIC METABOLIC PANEL
Anion gap: 8 (ref 5–15)
BUN: 30 mg/dL — ABNORMAL HIGH (ref 8–23)
CO2: 25 mmol/L (ref 22–32)
Calcium: 8.6 mg/dL — ABNORMAL LOW (ref 8.9–10.3)
Chloride: 107 mmol/L (ref 98–111)
Creatinine, Ser: 0.98 mg/dL (ref 0.61–1.24)
GFR, Estimated: >60 mL/min (ref >60)
Glucose, Bld: 107 mg/dL — ABNORMAL HIGH (ref 70–99)
Potassium: 4.4 mmol/L (ref 3.5–5.1)
Sodium: 140 mmol/L (ref 135–145)

## 2022-08-10 LAB — MAGNESIUM: Magnesium: 2.1 mg/dL (ref 1.7–2.4)

## 2022-08-10 LAB — GLUCOSE, CAPILLARY: Glucose-Capillary: 98 mg/dL (ref 70–99)

## 2022-08-10 MED ORDER — SODIUM CHLORIDE 0.9 % IV SOLN
1.0000 g | Freq: Two times a day (BID) | INTRAVENOUS | Status: DC
  Filled 2022-08-10: qty 1

## 2022-08-10 MED ORDER — POLYETHYLENE GLYCOL 3350 17 G PO PACK: 17.0000 g | PACK | Freq: Two times a day (BID) | ORAL | 0 refills | Status: DC

## 2022-08-10 MED ORDER — CIPROFLOXACIN HCL 500 MG PO TABS: 500.0000 mg | ORAL_TABLET | Freq: Two times a day (BID) | ORAL | 0 refills | Status: AC

## 2022-08-10 MED ORDER — CYANOCOBALAMIN 1000 MCG PO TABS: 1000.0000 ug | ORAL_TABLET | Freq: Every day | ORAL | 2 refills | Status: AC

## 2022-08-10 MED ORDER — VITAMIN D (ERGOCALCIFEROL) 1.25 MG (50000 UNIT) PO CAPS: 50000.0000 [IU] | ORAL_CAPSULE | ORAL | 2 refills | Status: AC

## 2022-08-10 MED ORDER — BISACODYL 5 MG PO TBEC
10.0000 mg | DELAYED_RELEASE_TABLET | Freq: Once | ORAL | Status: AC
  Administered 2022-08-10: 10 mg via ORAL
  Filled 2022-08-10: qty 2

## 2022-08-10 MED ORDER — METOPROLOL TARTRATE 25 MG PO TABS: 12.5000 mg | ORAL_TABLET | Freq: Two times a day (BID) | ORAL | 2 refills | Status: AC

## 2022-08-10 MED ORDER — POLYETHYLENE GLYCOL 3350 17 G PO PACK
17.0000 g | PACK | Freq: Two times a day (BID) | ORAL | Status: DC
  Administered 2022-08-10: 17 g via ORAL
  Filled 2022-08-10: qty 1

## 2022-08-10 MED ORDER — BISACODYL 5 MG PO TBEC: 10.0000 mg | DELAYED_RELEASE_TABLET | Freq: Every evening | ORAL | 0 refills | Status: DC | PRN

## 2022-08-10 MED ORDER — WARFARIN SODIUM 6 MG PO TABS
9.0000 mg | ORAL_TABLET | Freq: Once | ORAL | Status: AC
  Administered 2022-08-10: 9 mg via ORAL
  Filled 2022-08-10: qty 1

## 2022-08-10 NOTE — Plan of Care (Signed)
  Problem: Education: Goal: Knowledge of disease or condition will improve Outcome: Progressing Goal: Knowledge of secondary prevention will improve (MUST DOCUMENT ALL) Outcome: Progressing Goal: Knowledge of patient specific risk factors will improve Loraine Leriche N/A or DELETE if not current risk factor) Outcome: Progressing   Problem: Coping: Goal: Will verbalize positive feelings about self Outcome: Progressing Goal: Will identify appropriate support needs Outcome: Progressing   Problem: Self-Care: Goal: Ability to participate in self-care as condition permits will improve Outcome: Progressing Goal: Verbalization of feelings and concerns over difficulty with self-care will improve Outcome: Progressing Goal: Ability to communicate needs accurately will improve Outcome: Progressing   Problem: Nutrition: Goal: Risk of aspiration will decrease Outcome: Progressing Goal: Dietary intake will improve Outcome: Progressing   Problem: Activity: Goal: Risk for activity intolerance will decrease Outcome: Progressing   Problem: Nutrition: Goal: Adequate nutrition will be maintained Outcome: Progressing

## 2022-08-10 NOTE — TOC Transition Note (Signed)
Transition of Care [facility]) - CM/SW Discharge Note   Patient Details  Name: Erik Larson MRN: [id number] Date of Birth: January 12, 1925  Transition of Care [facility]) CM/SW Contact:  Garret Reddish, RN Phone Number: 08/10/2022, 3:09 PM   Clinical Narrative:   Chart reviewed.  Noted that patient has orders for discharge today. I have spoken with Velna Hatchet with [facility] facility. She reports that she will need to come and assess patient prior to his return to the facility.  Velna Hatchet has also requested the FL 2 and discharge summary.    I have spoken with patient's daughter Erik Larson.  She would like for her father to return to [facility].  She reports that she will transport her father to the facility today at 430 pm.  I have informed Erik Larson that patient will return with [facility] continuing to follow for subpubic catheter care, PT and OT.  I have also informed Erik Larson that [facility] will follow for Palliative Care Services.  I have also informed Velna Hatchet of the above information.  I have informed Elnita Maxwell with [facility] that patient would be discharging back to the facility today.  I have informed Elnita Maxwell that orders for [facility] include:  RN for Subpubic catheter care weekly and to change catheter out ever 4 weeks, PT and OT at the facility.  I have also made Elnita Maxwell aware that patient has redden area to patient's knee that the nurse will need to keep an eye for developing a wound.   1500:  I have meet with Velna Hatchet, Admission Coordinator with [facility].  She completed assess at the bedside for the patient.  I have given her the [facility] and discharge summary for the patient. She reports that patient will be able to return to the facility today.  Velna Hatchet informed me that the facility was unable to transport him back today. I have informed Velna Hatchet that patient's daughter  will transport patient back to facility today.    I have informed staff nurse to call report at [phone].         Final next level of care:  Assisted Living Barriers to Discharge: No Barriers Identified   Patient Goals and CMS Choice      Discharge Placement                Patient chooses bed at:  [facility] facility) Patient to be transferred to facility by: Patient's daughter Name of family member notified: Yves Dill patient's daughter Patient and family notified of of transfer: 08/10/22  Discharge Plan and Services Additional resources added to the After Visit Summary for       Post Acute Care Choice: Resumption of Svcs/PTA Provider                    HH Arranged: PT, OT, RN [facility] Agency: [facility] Date [facility] Agency Contacted: 08/10/22 Time HH Agency Contacted: 1200 Representative spoke with at [facility] Agency: Elnita Maxwell  Social Determinants of Health (SDOH) Interventions SDOH Screenings   Food Insecurity: No Food Insecurity (08/04/2022)  Housing: Low Risk  (08/04/2022)  Transportation Needs: No Transportation Needs (08/04/2022)  Utilities: Not At Risk (08/04/2022)  Financial Resource Strain: Low Risk  (07/12/2017)  Physical Activity: Inactive (07/12/2017)  Social Connections: Unknown (07/12/2017)  Stress: No Stress Concern Present (07/12/2017)  Tobacco Use: Medium Risk (08/04/2022)     Readmission Risk Interventions    08/05/2022    9:53 AM  Readmission Risk Prevention Plan  PCP  or Specialist Appt within 3-5 Days Complete  HRI or Home Care Consult Complete  Social Work Consult for Recovery Care Planning/Counseling Complete  Palliative Care Screening Complete

## 2022-08-10 NOTE — Consult Note (Signed)
ANTICOAGULATION CONSULT NOTE -   Pharmacy Consult for Warfarin Indication: atrial fibrillation  Allergies  Allergen Reactions   Ace Inhibitors    Penicillins Rash    Patient Measurements: Height: 5\' 10"  (177.8 cm) Weight: 80.1 kg (176 lb 9.4 oz) IBW/kg (Calculated) : 73  Vital Signs: Temp: 97.7 F (36.5 C) (05/29 0736) Temp Source: Oral (05/29 0736) BP: 131/58 (05/29 0736) Pulse Rate: 65 (05/29 0736)  Labs: Recent Labs    08/08/22 0457 08/09/22 0437 08/10/22 0350  HGB 11.2* 11.6*  --   HCT 32.9* 35.0*  --   PLT 218 238  --   LABPROT 19.1* 20.2* 19.8*  INR 1.6* 1.7* 1.7*  CREATININE 0.83 0.90 0.98     Estimated Creatinine Clearance: 46.6 mL/min (by C-G formula based on SCr of 0.98 mg/dL).   Medical History: Past Medical History:  Diagnosis Date   Arthritis    Atrial fibrillation (HCC)    controlled on coumadin   GERD (gastroesophageal reflux disease)    Glaucoma    Gout    Hyperlipidemia    valvular heart disease    Warfarin therapy started 07/29/2014    Medications:  PTA Warfarin: 35 mg/wk 5 mg daily  Assessment: Erik Larson is a 89 y.o. male presenting with concern for CVA. PMH significant for arthritis, AF, GERD, HLD, gout. Patient was on Warfarin PTA per chart review. Last dose of Warfarin PTA was on 08/02/2022 @ 1610 (5 mg). Of note, INR was 2.0 on home regimen on arrival. Per neurology, ok to resume home warfarin. Pharmacy has been consulted to initiate and manage warfarin.    DDI: ceftazidime (increase INR), allopurinol (home med)   Goal of Therapy:  INR 2-3 Monitor platelets by anticoagulation protocol: Yes   Date    INR      Warfarin Dose    5/22 2.0 5 mg (PTA)>>dose not given 5/23 1.7 6 mg 5/24 1.6 6 mg 5/25 1.7 5 mg 5/26 1.6 7.5 mg 5/27 1.6 7.5 mg 5/28 1.7 7.5 mg 5/29 1.7 9 mg  Plan:  INR is subtherapeutic but increased slightly. Not sure why INR is not moving.  Will give Warfarin 9 mg x 1 (increased from home dose). Daily  INR. CBC at least every 3 days.    Ronnald Ramp, PharmD 08/10/2022 9:30 AM

## 2022-08-10 NOTE — Discharge Summary (Signed)
[facility] Discharge Summary   Patient: Erik Larson:[id number]  PCP: Floydene Flock, MD  Date of admission: 08/03/2022   Date of discharge:  08/10/2022     Discharge Diagnoses:  Principal Problem:   Acute metabolic encephalopathy Active Problems:   Acute encephalopathy   CVA (cerebral vascular accident) (HCC)   Atrial fibrillation, controlled (HCC)   Acid reflux   Benign prostatic hyperplasia with lower urinary tract symptoms   Essential hypertension   Altered mental status   Admitted From: ALF Disposition:  ALF/ILF   Recommendations for Outpatient Follow-up:  PCP: in 1 wk  Follow-up with PCP, patient should be seen by an MD in 1 week, monitor BP and heart rate, started low-dose Lopressor 12.5 mg p.o. twice daily.  Repeat B12 and vitamin D level after 3 to 6 months. Change Foley catheter every 4 weeks.  Foley catheter was replaced on 08/04/2022 Follow up LABS/TEST: Monitor BP and titrate medications accordingly.   Diet recommendation: Dysphagia type 3 with Thin Liquid  Activity: The patient is advised to gradually reintroduce usual activities, as tolerated  Discharge Condition: stable  Code Status: DNR   History of present illness: As per the H and P dictated on admission  Hospital Course:  Erik Larson is a 89 y.o. male with medical history significant of atrial fibrillation on Coumadin, GERD, hyperlipidemia, BPH/urinary retention status post suprapubic catheter, hypertension presenting with encephalopathy and suspected CVA.  Limited history in the setting of encephalopathy.  Patient confused and answering minimal questions.  Per report, patient with an acute change in mentation since earlier today.  Patient was noted to be acutely confused by his daughter around 53 AM.  Patient lives at a local assisted living facility.  Per report, patient usually conversive at baseline.  Had acute confusion as well as aphasia per report.  No reports of fevers or chills.  No  reports of chest pain shortness of breath nausea vomiting.  Noted admission March 30 through April 2 for similar issues associated with UTI.  Had urine culture that showed Enterococcus faecalis and Corynebacterium species.  Completed course of IV ceftazidime as well as oral nitrofurantoin based on urine sensitivities.  No reports of recent change in urine pattern.  No reported abdominal pain or diarrhea. Presented to the ER afebrile, pressures 170s over 100s, satting well on room air.  White count 6.0, hemoglobin 12, platelets 234, urinalysis grossly stable apart from small leukocytes and rare bacteria, INR 2, creatinine 1.05.  Code stroke CT head as well as CTA head and neck grossly stable.  MRI of the brain pending.  Not a TNK candidate given elevated INR.     Assessment and Plan: # Acute metabolic encephalopathy CVA ruled out, CT scan and MRI negative for any acute findings EEG: There was no seizure or seizure predisposition recorded on this study. Please note that lack of epileptiform activity on EEG does not preclude the possibility of epilepsy.  Continue supportive care and fall precautions, continue aspiration precautions PT and OT eval, SLP eval done. Ambulate with assistance. Neurology was consulted, recommended encephalopathy could be secondary to UTI, no further intervention. Continue Risperdal 0.5 mg nightly. S/p Haldol as needed given during hospital stay for agitation.   # UTI, urine culture growing Pseudomonas Patient has chronic suprapubic Foley catheter which was exchanged on 06/30/2022 Foley catheter was replaced on 08/04/2022.  Family was educated to replace Foley catheter every 4 weeks. IR recommended to consult urology, and urology recommended to contact  ICU RN to exchange Foley catheter. urine culture growing Pseudomonas Putida 60k sensitive to ceftazidime and Cipro. Diphtheroids >100k susceptibility is not available.  S/p ceftriaxone 1 g IV daily, switched to ceftazidime 1 g every  12 hourly, switched to ciprofloxacin 500 mg p.o. twice daily for 3 additional days to complete 7-day course.   # Chronic persistent A-fib, HTN Continue Coumadin, monitor INR and dose accordingly.  Continue Lopressor 12.5 mg p.o. twice daily, monitor BP and heart rate and titrate dose accordingly.  # History of gout, continued allopurinol # BPH, continue Proscar # Vitamin B12 deficiency: Started vitamin B12 1000 mcg IM injection daily during hospital stay, followed by oral supplement.  Follow-up PCP to repeat vitamin B12 level after 3 to 6 months. # Vitamin D deficiency: started vitamin D 50,000 units p.o. weekly, follow with PCP to repeat vitamin D level after 3 to 6 months. # Conjunctivitis, Neosporin twice daily x 4 days till 5/29.  Use fresh tears prn  # Constipation, started MiraLAX twice daily and Dulcolax as needed.  Body mass index is 25.34 kg/m.  Nutrition Problem: Inadequate oral intake Etiology: inability to eat, lethargy/confusion Nutrition Interventions: Interventions: Ensure Enlive (each supplement provides 350kcal and 20 grams of protein)  Patient was seen by physical therapy, who recommended Home health, which was arranged. On the day of the discharge the patient's vitals were stable, and no other acute medical condition were reported by patient. the patient was felt safe to be discharge at ALF/ILF with Home health.  Consultants: Palliative care Procedures: Suprapubic Foley catheter exchanged on 08/04/2022, replace Foley catheter every 4 weeks.  Discharge Exam: General: Appear in no distress, no Rash; Oral Mucosa Clear, moist. Cardiovascular: S1 and S2 Present, no Murmur, Respiratory: normal respiratory effort, Bilateral Air entry present and no Crackles, no wheezes Abdomen: Bowel Sound present, Soft and no tenderness, no hernia Extremities: no Pedal edema, no calf tenderness Neurology: alert and oriented to time, place, and person affect appropriate.  Filed Weights    08/03/22 1335 08/04/22 1541  Weight: 84.8 kg 80.1 kg   Vitals:   08/10/22 0736 08/10/22 1201  BP: (!) 131/58 128/64  Pulse: 65 68  Resp: 14 16  Temp: 97.7 F (36.5 C) (!) 97.5 F (36.4 C)  SpO2: 94% 100%    DISCHARGE MEDICATION: Allergies as of 08/10/2022       Reactions   Ace Inhibitors    Penicillins Rash        Medication List     STOP taking these medications    Bactrim 400-80 MG tablet Generic drug: sulfamethoxazole-trimethoprim   nitrofurantoin 100 MG capsule Commonly known as: MACRODANTIN   psyllium 0.52 g capsule Commonly known as: REGULOID   sennosides-docusate sodium 8.6-50 MG tablet Commonly known as: SENOKOT-S       TAKE these medications    acetaminophen 500 MG tablet Commonly known as: TYLENOL Take 500 mg by mouth every 6 (six) hours as needed for mild pain or moderate pain.   allopurinol 100 MG tablet Commonly known as: ZYLOPRIM Take 100 mg by mouth daily.   bisacodyl 5 MG EC tablet Commonly known as: DULCOLAX Take 2 tablets (10 mg total) by mouth at bedtime as needed for moderate constipation.   Cholecalciferol 50 MCG (2000 UT) Tabs Take 2,000 Units by mouth daily.   ciprofloxacin 500 MG tablet Commonly known as: Cipro Take 1 tablet (500 mg total) by mouth 2 (two) times daily for 3 doses.   Cranberry 450 MG Tabs Take 450  mg by mouth daily.   cyanocobalamin 1000 MCG tablet Take 1 tablet (1,000 mcg total) by mouth daily. Start taking on: Aug 12, 2022   dorzolamide-timolol 2-0.5 % ophthalmic solution Commonly known as: COSOPT Place 1 drop into both eyes 2 (two) times daily.   finasteride 5 MG tablet Commonly known as: PROSCAR Take 1 tablet (5 mg total) by mouth daily.   latanoprost 0.005 % ophthalmic solution Commonly known as: XALATAN Place 1 drop into both eyes at bedtime. Dr Beverely Pace   melatonin 3 MG Tabs tablet Take 3 mg by mouth at bedtime as needed.   metoprolol tartrate 25 MG tablet Commonly known as:  LOPRESSOR Take 0.5 tablets (12.5 mg total) by mouth 2 (two) times daily.   mupirocin ointment 2 % Commonly known as: BACTROBAN Apply 1 Application topically 2 (two) times daily.   phenazopyridine 95 MG tablet Commonly known as: PYRIDIUM Take 95 mg by mouth 3 (three) times daily as needed (urinary spasms).   polyethylene glycol 17 g packet Commonly known as: MIRALAX / GLYCOLAX Take 17 g by mouth 2 (two) times daily.   risperiDONE 0.5 MG tablet Commonly known as: RISPERDAL Take 0.5 mg by mouth at bedtime.   tamsulosin 0.4 MG Caps capsule Commonly known as: FLOMAX Take 0.4 mg by mouth daily.   Vitamin D (Ergocalciferol) 1.25 MG (50000 UNIT) Caps capsule Commonly known as: DRISDOL Take 1 capsule (50,000 Units total) by mouth every 7 (seven) days. Start taking on: Aug 12, 2022   warfarin 5 MG tablet Commonly known as: COUMADIN Take 5 mg by mouth daily.       Allergies  Allergen Reactions   Ace Inhibitors    Penicillins Rash   Discharge Instructions     Call MD for:  difficulty breathing, headache or visual disturbances   Complete by: As directed    Call MD for:  extreme fatigue   Complete by: As directed    Call MD for:  persistant dizziness or light-headedness   Complete by: As directed    Call MD for:  persistant nausea and vomiting   Complete by: As directed    Call MD for:  severe uncontrolled pain   Complete by: As directed    Call MD for:  temperature >100.4   Complete by: As directed    Diet - low sodium heart healthy   Complete by: As directed    Discharge instructions   Complete by: As directed    Follow-up with PCP, patient should be seen by an MD in 1 week, monitor BP and heart rate, started low-dose Lopressor 12.5 mg p.o. twice daily.  Repeat B12 and vitamin D level after 3 to 6 months. Change Foley catheter every 4 weeks.  Foley catheter was replaced on August 18, 2022   Increase activity slowly   Complete by: As directed        The results of  significant diagnostics from this hospitalization (including imaging, microbiology, ancillary and laboratory) are listed below for reference.    Significant Diagnostic Studies: EEG adult  Result Date: 18-Aug-2022 Rejeana Brock, MD     08/05/2022  8:30 AM History: 89 yo M with AMS Sedation: none Technique: This EEG was acquired with electrodes placed according to the International 10-20 electrode system (including Fp1, Fp2, F3, F4, C3, C4, P3, P4, O1, O2, T3, T4, T5, T6, A1, A2, Fz, Cz, Pz). The following electrodes were missing or displaced: none. Background: There is a posterior dominant rhythm that is poorly formed and  poorly sustained of 7-8 Hz. There is also diffuse irregular slow activity throughout the study. With arousal, there are frontally predominant discharges that are periodic with a frequency of 1 Hz with triphasic morphology. Photic stimulation: Physiologic driving is not perofrmed EEG Abnormalities: 1) Generalized periodic discharges(GPDs) with triphasic morphology 2) Generalized slow activity 3) Slow PDR Clinical Interpretation: This . There was no seizure or seizure predisposition recorded on this study. Please note that lack of epileptiform activity on EEG does not preclude the possibility of epilepsy. Ritta Slot, MD [facility] [phone] If 7pm- 7am, please page neurology on call as listed in AMION.   ECHOCARDIOGRAM COMPLETE  Result Date: 08/04/2022    ECHOCARDIOGRAM REPORT   Patient Name:   Erik Larson Date of Exam: 08/03/2022 Medical Rec #:  [id number]     Height:       67.0 in Accession #:    [id number]    Weight:       187.0 lb Date of Birth:  05/12/1924    BSA:          1.965 m Patient Age:    89 years      BP:           144/65 mmHg Patient Gender: M             HR:           85 bpm. Exam Location:  [facility] Procedure: 2D Echo, Cardiac Doppler and Color Doppler Indications:     G45.9 TIA  History:         Patient has no prior history of Echocardiogram  examinations.                  Arrythmias:Atrial Fibrillation; Risk Factors:Dyslipidemia.  Sonographer:     Daphine Deutscher RDCS Referring Phys:  [id number] Francoise Schaumann [place] Diagnosing Phys: Julien Nordmann MD IMPRESSIONS  1. Left ventricular ejection fraction, by estimation, is 60 to 65%. The left ventricle has normal function. The left ventricle has no regional wall motion abnormalities. Left ventricular diastolic parameters are indeterminate.  2. Right ventricular systolic function is normal. The right ventricular size is normal. There is normal pulmonary artery systolic pressure. The estimated right ventricular systolic pressure is 29.0 mmHg.  3. The mitral valve is normal in structure. Mild mitral valve regurgitation. No evidence of mitral stenosis.  4. Tricuspid valve regurgitation is mild to moderate.  5. The aortic valve is normal in structure. There is mild calcification of the aortic valve. Aortic valve regurgitation is mild. Aortic valve sclerosis is present, with no evidence of aortic valve stenosis.  6. The inferior vena cava is normal in size with greater than 50% respiratory variability, suggesting right atrial pressure of 3 mmHg. FINDINGS  Left Ventricle: Left ventricular ejection fraction, by estimation, is 60 to 65%. The left ventricle has normal function. The left ventricle has no regional wall motion abnormalities. The left ventricular internal cavity size was normal in size. There is  no left ventricular hypertrophy. Left ventricular diastolic parameters are indeterminate. Right Ventricle: The right ventricular size is normal. No increase in right ventricular wall thickness. Right ventricular systolic function is normal. There is normal pulmonary artery systolic pressure. The tricuspid regurgitant velocity is 2.45 m/s, and  with an assumed right atrial pressure of 5 mmHg, the estimated right ventricular systolic pressure is 29.0 mmHg. Left Atrium: Left atrial size was normal in size. Right  Atrium: Right atrial size was normal in size. Pericardium: There is no evidence  of pericardial effusion. Mitral Valve: The mitral valve is normal in structure. Mild mitral valve regurgitation. No evidence of mitral valve stenosis. Tricuspid Valve: The tricuspid valve is normal in structure. Tricuspid valve regurgitation is mild to moderate. No evidence of tricuspid stenosis. Aortic Valve: The aortic valve is normal in structure. There is mild calcification of the aortic valve. Aortic valve regurgitation is mild. Aortic regurgitation PHT measures 704 msec. Aortic valve sclerosis is present, with no evidence of aortic valve stenosis. Pulmonic Valve: The pulmonic valve was normal in structure. Pulmonic valve regurgitation is not visualized. No evidence of pulmonic stenosis. Aorta: The aortic root is normal in size and structure. Venous: The inferior vena cava is normal in size with greater than 50% respiratory variability, suggesting right atrial pressure of 3 mmHg. IAS/Shunts: No atrial level shunt detected by color flow Doppler.  LEFT VENTRICLE PLAX 2D LVIDd:         4.10 cm LVIDs:         2.90 cm LV PW:         1.40 cm LV IVS:        1.20 cm LVOT diam:     2.40 cm LV SV:         76 LV SV Index:   39 LVOT Area:     4.52 cm  RIGHT VENTRICLE             IVC RV Basal diam:  4.20 cm     IVC diam: 1.10 cm RV S prime:     12.95 cm/s TAPSE (M-mode): 2.1 cm LEFT ATRIUM             Index        RIGHT ATRIUM           Index LA diam:        5.10 cm 2.60 cm/m   RA Area:     22.80 cm LA Vol (A2C):   62.9 ml 32.01 ml/m  RA Volume:   71.50 ml  36.38 ml/m LA Vol (A4C):   49.7 ml 25.29 ml/m LA Biplane Vol: 58.1 ml 29.56 ml/m  AORTIC VALVE LVOT Vmax:   94.80 cm/s LVOT Vmean:  76.650 cm/s LVOT VTI:    0.168 m AI PHT:      704 msec  AORTA Ao Root diam: 3.50 cm Ao Asc diam:  3.70 cm MV E velocity: 119.00 cm/s  TRICUSPID VALVE                             TR Peak grad:   24.0 mmHg                             TR Vmax:        245.00  cm/s                              SHUNTS                             Systemic VTI:  0.17 m                             Systemic Diam: 2.40 cm Julien Nordmann MD Electronically signed by Julien Nordmann MD Signature Date/Time: 08/04/2022/12:40:57 PM  Final    MR BRAIN WO CONTRAST  Result Date: 08/03/2022 CLINICAL DATA:  Neuro deficit, acute, stroke suspected aphasia EXAM: MRI HEAD WITHOUT CONTRAST TECHNIQUE: Multiplanar, multiecho pulse sequences of the brain and surrounding structures were obtained without intravenous contrast. COMPARISON:  Same day CT head. FINDINGS: Brain: No acute infarction, acute hemorrhage, hydrocephalus, extra-axial collection or mass lesion. Remote hemorrhagic infarct in the left basal ganglia. Additional scattered T2/FLAIR hyperintensities in the white matter are compatible with chronic microvascular ischemic disease. Cerebral atrophy. Cavum septum pellucidum et vergae, anatomic variant. Vascular: Major arterial flow voids are maintained at the skull base. Skull and upper cervical spine: Normal marrow signal. Sinuses/Orbits: Clear sinuses.  No acute orbital findings. Other: No mastoid effusions. IMPRESSION: 1. No evidence of acute intracranial abnormality. 2. Remote left basal ganglia infarct. 3.  Cerebral Atrophy (ICD10-G31.9). Electronically Signed   By: Feliberto Harts M.D.   On: 08/03/2022 15:26   CT ANGIO HEAD NECK W WO CM (CODE STROKE)  Result Date: 08/03/2022 CLINICAL DATA:  Neuro deficit, acute, stroke suspected EXAM: CT ANGIOGRAPHY HEAD AND NECK WITH AND WITHOUT CONTRAST TECHNIQUE: Multidetector CT imaging of the head and neck was performed using the standard protocol during bolus administration of intravenous contrast. Multiplanar CT image reconstructions and MIPs were obtained to evaluate the vascular anatomy. Carotid stenosis measurements (when applicable) are obtained utilizing NASCET criteria, using the distal internal carotid diameter as the denominator. RADIATION  DOSE REDUCTION: This exam was performed according to the departmental dose-optimization program which includes automated exposure control, adjustment of the mA and/or kV according to patient size and/or use of iterative reconstruction technique. CONTRAST:  50mL OMNIPAQUE IOHEXOL 350 MG/ML SOLN COMPARISON:  Same day CT head FINDINGS: CT HEAD FINDINGS See same day CT brain for intracranial findings. No intracranial contrast-enhancing lesion. CTA NECK FINDINGS Aortic arch: Standard branching. Imaged portion shows no evidence of aneurysm or dissection. No significant stenosis of the major arch vessel origins. Right carotid system: No evidence of dissection, stenosis (50% or greater), or occlusion. Mild (less than 50%) narrowing of the origin of the right ICA secondary to mixed calcified and noncalcified atherosclerotic plaque. Left carotid system: No evidence of dissection, stenosis (50% or greater), or occlusion. Vertebral arteries: Right vertebral artery terminates in a PICA. No evidence of dissection, stenosis (50% or greater), or occlusion. Skeleton: Multilevel degenerative changes in the cervical spine. Other neck: Negative. Upper chest: Negative. Review of the MIP images confirms the above findings CTA HEAD FINDINGS Anterior circulation: No significant stenosis, proximal occlusion, aneurysm, or vascular malformation. Posterior circulation: No significant stenosis, proximal occlusion, aneurysm, or vascular malformation. Venous sinuses: As permitted by contrast timing, patent. Anatomic variants: Fetal PCAs bilaterally. Review of the MIP images confirms the above findings IMPRESSION: No intracranial large vessel occlusion or hemodynamically significant stenosis in the neck. Electronically Signed   By: Lorenza Cambridge M.D.   On: 08/03/2022 13:37   CT HEAD CODE STROKE WO CONTRAST  Result Date: 08/03/2022 CLINICAL DATA:  Code stroke. Neuro deficit, acute, stroke suspected. Aphasia. EXAM: CT HEAD WITHOUT CONTRAST  TECHNIQUE: Contiguous axial images were obtained from the base of the skull through the vertex without intravenous contrast. RADIATION DOSE REDUCTION: This exam was performed according to the departmental dose-optimization program which includes automated exposure control, adjustment of the mA and/or kV according to patient size and/or use of iterative reconstruction technique. COMPARISON:  Prior head CT examinations 06/11/2022 and earlier. FINDINGS: Brain: Generalized parenchymal atrophy. Redemonstrated chronic infarct within the left basal ganglia/subinsular white matter.  Background mild patchy and ill-defined hypoattenuation within the cerebral white matter, nonspecific but compatible with chronic small ischemic disease. Cavum septum pellucidum and cavum vergae (anatomic variant). There is no acute intracranial hemorrhage. No demarcated cortical infarct. No extra-axial fluid collection. No evidence of an intracranial mass. No midline shift. Vascular: No hyperdense vessel.  Atherosclerotic calcifications. Skull: No fracture or aggressive osseous lesion. Sinuses/Orbits: No mass or acute finding within the imaged orbits. Mild mucosal thickening within the left maxillary sinus at the imaged levels. ASPECTS [facility] Stroke Program Early CT Score) - Ganglionic level infarction (caudate, lentiform nuclei, internal capsule, insula, M1-M3 cortex): 7 - Supraganglionic infarction (M4-M6 cortex): 3 Total score (0-10 with 10 being normal): 10 (when discounting the chronic infarct). No evidence of an acute intracranial abnormality. These results were communicated to Dr. Amada Jupiter at 1:21 pmon 5/22/2024by text page via the [facility] messaging system. IMPRESSION: 1. No evidence of an acute intracranial abnormality. 2. Redemonstrated chronic infarct within the left basal ganglia/subinsular white matter. 3. Background mild cerebral white matter chronic small vessel disease. 4. Generalized parenchymal atrophy. Electronically Signed    By: Jackey Loge D.O.   On: 08/03/2022 13:21    Microbiology: Recent Results (from the past 240 hour(s))  Urine Culture     Status: Abnormal   Collection Time: 08/03/22  2:21 PM   Specimen: Urine, Random  Result Value Ref Range Status   Specimen Description   Final    URINE, RANDOM Performed at [facility], [address], [place], [place] [address]    Special Requests   Final    NONE Reflexed from [phone] Performed at [facility], [address] [place], [place] [address]    Culture (A)  Final    60,000 COLONIES/mL PSEUDOMONAS PUTIDA >=100,000 COLONIES/mL DIPHTHEROIDS(CORYNEBACTERIUM SPECIES) Standardized susceptibility testing for this organism is not available. Performed at [facility], [address], [place], [place] [address]    Report Status 08/06/2022 FINAL  Final   Organism ID, Bacteria PSEUDOMONAS PUTIDA (A)  Final      Susceptibility   Pseudomonas putida - MIC*    CEFTAZIDIME 4 SENSITIVE Sensitive     CIPROFLOXACIN <=0.25 SENSITIVE Sensitive     GENTAMICIN <=1 SENSITIVE Sensitive     IMIPENEM 0.5 SENSITIVE Sensitive     PIP/TAZO 8 SENSITIVE Sensitive     * 60,000 COLONIES/mL PSEUDOMONAS PUTIDA     Labs: CBC: Recent Labs  Lab 08/03/22 1307 08/05/22 0535 08/06/22 0529 08/07/22 0431 08/08/22 0457 08/09/22 0437  WBC 6.0 6.7 7.7 7.2 6.3 6.4  NEUTROABS 3.6  --   --   --   --   --   HGB 12.0* 11.1* 11.2* 11.3* 11.2* 11.6*  HCT 35.6* 31.8* 33.1* 33.5* 32.9* 35.0*  MCV 96.0 94.1 95.4 95.4 95.1 95.4  PLT 234 207 218 221 218 238   Basic Metabolic Panel: Recent Labs  Lab 08/04/22 0706 08/05/22 0535 08/06/22 0529 08/07/22 0431 08/08/22 0457 08/09/22 0437 08/10/22 0350  NA  --  138 140 140 137 139 140  K  --  3.5 3.8 3.8 3.9 3.8 4.4  CL  --  108 110 108 106 104 107  CO2  --  24 25 26 26 25 25   GLUCOSE  --  101* 102* 98 93 118* 107*  BUN  --  27* 22 25* 23 21 30*  CREATININE  --  1.01 0.93 0.94 0.83 0.90 0.98  CALCIUM  --   8.5* 8.6* 8.3* 8.2* 8.7* 8.6*  MG 1.9 2.0 1.9 2.1  --   --  2.1  PHOS 3.7 3.9 3.7 3.6  --   --   --    Liver Function Tests: Recent Labs  Lab 08/03/22 1307  AST 22  ALT 15  ALKPHOS 86  BILITOT 0.9  PROT 7.7  ALBUMIN 3.9   No results for input(s): "LIPASE", "AMYLASE" in the last 168 hours. Recent Labs  Lab 08/03/22 1630  AMMONIA <10   Cardiac Enzymes: No results for input(s): "CKTOTAL", "CKMB", "CKMBINDEX", "TROPONINI" in the last 168 hours. BNP (last 3 results) No results for input(s): "BNP" in the last 8760 hours. CBG: Recent Labs  Lab 08/09/22 0013 08/09/22 0851 08/09/22 1648 08/09/22 2007 08/10/22 0737  GLUCAP 97 78 89 119* 98    Time spent: 35 minutes  Signed:  Gillis Santa  [facility] 08/10/2022 12:04 PM

## 2022-08-10 NOTE — Progress Notes (Signed)
Palliative:  Consult.  Thorough chart review completed.  Conference with team related to plan.  Call to daughter/HCPOA, Yves Dill.  Overall, Erik Larson is very knowledgeable about her father's acute and chronic health conditions.  She independently discussed assessment by [facility] worker scheduled for today.  She is tells me that she has spoken with her contact today or and knows the plan.  She tells me that she is working on transportation, and that she feels that she can transport him in her private vehicle if it is too late for their regular transport service.  She also shares that she has discussed his condition with the in-house nurse practitioner.  She shares that Mr. Thiesse has a weekly catheter checks with change out monthly.  We talk about the treatment plan as far as urine culture and antibiotics.  We also talk about outpatient palliative services for further support.  Conference with attending, bedside nursing staff, transition of care team related to patient condition, needs, goals of care, disposition.  Plan: At this point continue to treat the treatable but no CPR or intubation.  UA grew pseudomonas--ceftazidime 1G BID X 7 days, switch to Cipro on DC to complete the 7 days. ACC out pt palliative is to follow.   50 minutes  Lillia Carmel, NP Palliative medicine team Team phone [phone] Greater than 50% of this time was spent counseling and coordinating care related to the above assessment and

## 2022-08-10 NOTE — [place] FL2 (Signed)
[place] MEDICAID FL2 LEVEL OF CARE FORM     IDENTIFICATION  Patient Name: Erik Larson Birthdate: 11-04-1924 Sex: male Admission Date (Current Location): 08/03/2022  [facility] and [place] Number:  Chiropodist and Address:  [facility], [address] [place], [place] [address]      Provider Number: [id number]  Attending Physician Name and Address:  Gillis Santa, MD  Relative Name and Phone Number:  Yves Dill [phone]    Current Level of Care: Hospital Recommended Level of Care: Assisted Living Facility Prior Approval Number:    Date Approved/Denied:   PASRR Number:    Discharge Plan:  (Assisted Living with Home Health and Palliative Care following)    Current Diagnoses: Patient Active Problem List   Diagnosis Date Noted   CVA (cerebral vascular accident) (HCC) 08/03/2022   Acute encephalopathy 08/03/2022   Acute UTI 06/13/2022   Altered mental status 06/11/2022   Benign prostatic hyperplasia with lower urinary tract symptoms 10/20/2021   Essential hypertension 10/20/2021   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   Polypharmacy 08/17/2021   Acute metabolic encephalopathy 08/15/2021   Moderate dementia (HCC) 07/25/2021   Anemia 06/13/2021   Hyperuricemia 10/09/2015   BPH associated with nocturia 10/09/2015   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/29/2014   Interval gout 07/29/2014   Diverticulitis of colon 07/29/2014   Long term current use of anticoagulant therapy 07/29/2014   Atrial fibrillation, controlled (HCC) 07/29/2014   Acid reflux 07/29/2014    Orientation RESPIRATION BLADDER Height & Weight     Self, Time  Normal  (Sub pubic catheter) Weight: 80.1 kg Height:  5\' 10"  (177.8 cm)  BEHAVIORAL SYMPTOMS/MOOD NEUROLOGICAL BOWEL NUTRITION STATUS      Continent  (See Discharge Summary)  AMBULATORY STATUS COMMUNICATION OF NEEDS Skin   Extensive Assist Verbally Normal                        Personal Care Assistance Level of Assistance  Bathing, Feeding, Dressing Bathing Assistance: Maximum assistance Feeding assistance: Limited assistance Dressing Assistance: Maximum assistance     Functional Limitations Info  Sight, Hearing, Speech Sight Info: Impaired (Low Vision) Hearing Info: Impaired (Hard of hearing) Speech Info: Adequate    SPECIAL CARE FACTORS FREQUENCY  PT (By licensed PT), OT (By licensed OT)     PT Frequency: 2-3x weekly OT Frequency: 2-3x weekly            Contractures Contractures Info: Not present    Additional Factors Info  Code Status, Allergies Code Status Info: DNR Allergies Info: Ace Inhibitors, Penicillins           Current Medications (08/10/2022):  This is the current hospital active medication list Current Facility-Administered Medications  Medication Dose Route Frequency Provider Last Rate Last Admin    stroke: early stages of recovery book   Does not apply Once Floydene Flock, MD       acetaminophen (TYLENOL) tablet 650 mg  650 mg Oral Q6H PRN Gillis Santa, MD       allopurinol (ZYLOPRIM) tablet 100 mg  100 mg Oral Daily Floydene Flock, MD   100 mg at 08/10/22 1115   bacitracin-polymyxin b (POLYSPORIN) ophthalmic ointment   Both Eyes BID Gillis Santa, MD   Given at 08/10/22 1116   bisacodyl (DULCOLAX) EC tablet 10 mg  10 mg Oral QHS PRN Gillis Santa, MD       bisacodyl (DULCOLAX) suppository 10 mg  10 mg Rectal  Daily PRN Gillis Santa, MD       cefTAZidime (FORTAZ) 1 g in sodium chloride 0.9 % 100 mL IVPB  1 g Intravenous Q12H Paschal Dopp S, RPH       cyanocobalamin (VITAMIN B12) injection 1,000 mcg  1,000 mcg Intramuscular Daily Gillis Santa, MD   1,000 mcg at 08/10/22 1144   Followed by   Melene Muller ON 08/12/2022] cyanocobalamin (VITAMIN B12) tablet 1,000 mcg  1,000 mcg Oral Daily Gillis Santa, MD       feeding supplement (ENSURE ENLIVE / ENSURE PLUS) liquid 237 mL  237 mL Oral TID BM Gillis Santa, MD   237 mL at  08/10/22 1125   finasteride (PROSCAR) tablet 5 mg  5 mg Oral Daily Floydene Flock, MD   5 mg at 08/10/22 1115   haloperidol lactate (HALDOL) injection 1 mg  1 mg Intravenous Q6H PRN Gillis Santa, MD   1 mg at 08/07/22 1926   metoprolol tartrate (LOPRESSOR) injection 5 mg  5 mg Intravenous Q6H PRN Gillis Santa, MD       metoprolol tartrate (LOPRESSOR) tablet 25 mg  25 mg Oral BID Gillis Santa, MD   25 mg at 08/10/22 1115   multivitamin with minerals tablet 1 tablet  1 tablet Oral Daily Gillis Santa, MD   1 tablet at 08/10/22 1116   ondansetron (ZOFRAN) injection 4 mg  4 mg Intravenous Q6H PRN Gillis Santa, MD       polyethylene glycol (MIRALAX / GLYCOLAX) packet 17 g  17 g Oral BID Gillis Santa, MD   17 g at 08/10/22 1115   polyvinyl alcohol (LIQUIFILM TEARS) 1.4 % ophthalmic solution 1 drop  1 drop Both Eyes BID Gillis Santa, MD   1 drop at 08/10/22 1127   risperiDONE (RISPERDAL) tablet 0.5 mg  0.5 mg Oral QHS Floydene Flock, MD   0.5 mg at 08/09/22 2113   Vitamin D (Ergocalciferol) (DRISDOL) 1.25 MG (50000 UNIT) capsule 50,000 Units  50,000 Units Oral Q7 days Gillis Santa, MD   50,000 Units at 08/05/22 1142   warfarin (COUMADIN) tablet 9 mg  9 mg Oral ONCE-1600 Ronnald Ramp, [facility]       Warfarin - Pharmacist Dosing Inpatient   Does not apply q1600 Celene Squibb, [facility]   Given at 08/08/22 1608     Discharge Medications:  acetaminophen 500 MG tablet Commonly known as: TYLENOL Take 500 mg by mouth every 6 (six) hours as needed for mild pain or moderate pain.    allopurinol 100 MG tablet Commonly known as: ZYLOPRIM Take 100 mg by mouth daily.    bisacodyl 5 MG EC tablet Commonly known as: DULCOLAX Take 2 tablets (10 mg total) by mouth at bedtime as needed for moderate constipation.    Cholecalciferol 50 MCG (2000 UT) Tabs Take 2,000 Units by mouth daily.    ciprofloxacin 500 MG tablet Commonly known as: Cipro Take 1 tablet (500 mg total) by mouth 2 (two) times daily for  3 doses.    Cranberry 450 MG Tabs Take 450 mg by mouth daily.    cyanocobalamin 1000 MCG tablet Take 1 tablet (1,000 mcg total) by mouth daily. Start taking on: Aug 12, 2022    dorzolamide-timolol 2-0.5 % ophthalmic solution Commonly known as: COSOPT Place 1 drop into both eyes 2 (two) times daily.    finasteride 5 MG tablet Commonly known as: PROSCAR Take 1 tablet (5 mg total) by mouth daily.    latanoprost 0.005 % ophthalmic solution Commonly  known as: XALATAN Place 1 drop into both eyes at bedtime. Dr Beverely Pace    melatonin 3 MG Tabs tablet Take 3 mg by mouth at bedtime as needed.    metoprolol tartrate 25 MG tablet Commonly known as: LOPRESSOR Take 0.5 tablets (12.5 mg total) by mouth 2 (two) times daily.    mupirocin ointment 2 % Commonly known as: BACTROBAN Apply 1 Application topically 2 (two) times daily.    phenazopyridine 95 MG tablet Commonly known as: PYRIDIUM Take 95 mg by mouth 3 (three) times daily as needed (urinary spasms).    polyethylene glycol 17 g packet Commonly known as: MIRALAX / GLYCOLAX Take 17 g by mouth 2 (two) times daily.    risperiDONE 0.5 MG tablet Commonly known as: RISPERDAL Take 0.5 mg by mouth at bedtime.    tamsulosin 0.4 MG Caps capsule Commonly known as: FLOMAX Take 0.4 mg by mouth daily.    Vitamin D (Ergocalciferol) 1.25 MG (50000 UNIT) Caps capsule Commonly known as: DRISDOL Take 1 capsule (50,000 Units total) by mouth every 7 (seven) days. Start taking on: Aug 12, 2022    warfarin 5 MG tablet Commonly known as: COUMADIN Take 5 mg by mouth daily.           Relevant Imaging Results:  Relevant Lab Results:   Additional Information [id number]  Garret Reddish, RN

## 2022-08-16 ENCOUNTER — Encounter: Payer: Self-pay | Admitting: Nurse Practitioner

## 2022-08-16 ENCOUNTER — Non-Acute Institutional Stay: Payer: Self-pay | Admitting: Nurse Practitioner

## 2022-08-16 DIAGNOSIS — Z515 Encounter for palliative care: Secondary | ICD-10-CM

## 2022-08-16 DIAGNOSIS — R5381 Other malaise: Secondary | ICD-10-CM

## 2022-08-16 DIAGNOSIS — I639 Cerebral infarction, unspecified: Secondary | ICD-10-CM

## 2022-08-16 NOTE — Progress Notes (Signed)
Therapist, nutritional Palliative Care Consult Note Telephone: [phone]  Fax: [phone]   Date of encounter: 08/16/22 3:18 PM PATIENT NAME: Erik Larson [address] [place] [address]   [phone] (home)  DOB: 1925/01/26 MRN: [id number] PRIMARY CARE PROVIDER:   [facility] Floydene Flock, MD,  [address] [place] [address] [place] [address] [phone]  RESPONSIBLE PARTY:    Contact Information     Name Relation Home Work Mobile   Hendricks,Kay Daughter   [phone]   Jonathon Bellows   [phone]   Erenest Rasher Daughter   [phone]      I met face to face with patient in facility. Palliative Care was asked to follow this patient by consultation request of  Floydene Flock, MD to address advance care planning and complex medical decision making. This is the initial visit.         ASSESSMENT AND PLAN / RECOMMENDATIONS:  Symptom Management/Plan: 1. Advance Care Planning;  unable to discuss with dtg, cancelled pc consult 2. Goals of Care: Goals include to maximize quality of life and symptom management. Our advance care planning conversation included a discussion about:    The value and importance of advance care planning  Exploration of personal, cultural or spiritual beliefs that might influence medical decisions  Exploration of goals of care in the event of a sudden injury or illness  Identification and preparation of a healthcare agent  Review and updating or creation of an advance directive document. 3. Palliative care encounter; Palliative care encounter; Palliative medicine team will continue to support patient, patient's family, and medical team. Visit consisted of counseling and education dealing with the complex and emotionally intense issues of symptom management and palliative care in the setting of serious and potentially life-threatening illness 4. CVA, debility with recent hospitalization for UTI, dtg [name]  wishes no long to have PC, will d/c.  I spent 41 minutes providing this consultation. More than 50% of the time in this consultation was spent in counseling and care coordination. PPS: 30%  Chief Complaint: Initial palliative consult for complex medical decision making, address goals, manage ongoing symptoms  HISTORY OF PRESENT ILLNESS:  Erik Larson is a 89 y.o. year old male  with multiple medical problems including CVA, afib, HLD, BPH/urinary retention, HTN, GERD, gout, vitamin b12 deficiency, vitamin D deficiency. Hospitalized from 08/03/2022 to 08/10/2022 for acute metabolic encephalopathy. Workup with CT/MRI negative for acute findings; Neurology was consulted, recommended encephalopathy could be secondary to UTI growing pseudomonas. Mr Tourville was stabilized and d/c back to [facility] where he resides. Staff endorses prior to hospitalization Mr Sachar was able to ambulate to dining area, currently unable to do, requires assistance for bathing, dressing. Staff endorses more confusion since returned from hospital. At present Mr Shaheen is lying in bed, appears debilitated, fragile, elderly, no visitors present. Mr Belew was confused, cooperative with assessment, unable to answer questions due to cognitive impairment. Support provided. [employer] and I attempted to contact dtg, [name] who came into [facility]. I introduced myself, dtg, [name] became upset endorses she was told palliative would only call her to check on Mr Balzer every so often. Attempted to explain pc role, how consult was on d/c summary, facility provider, attempted to explain [facility] can exist with PC. Dtg upset she was not called prior to visit, attempted to explain, then Dtg endorses she does not wish to have pc for her dad. Contact information given should  she change her mind can contact, facility staff present for discussion.   History obtained from review of EMR, discussion with primary team, and interview with family, facility  staff/caregiver and/or Mr. Ohlman.  I reviewed available labs, medications, imaging, studies and related documents from the EMR.  Records reviewed and summarized above.   Physical Exam: General: frail appearing, elderly male ENMT: oral mucous membranes moist CV: S1S2, RRR Pulmonary: breath sounds clear, decreased bases Abdomen: normo-active BS + 4 quadrants, soft and non tender Skin: warm and dry Neuro:  + generalized weakness,  + cognitive impairment Psych: non-anxious affect, confused CURRENT PROBLEM LIST:  Patient Active Problem List   Diagnosis Date Noted   CVA (cerebral vascular accident) (HCC) 08/03/2022   Acute encephalopathy 08/03/2022   Acute UTI 06/13/2022   Altered mental status 06/11/2022   Benign prostatic hyperplasia with lower urinary tract symptoms 10/20/2021   Essential hypertension 10/20/2021   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   Polypharmacy 08/17/2021   Acute metabolic encephalopathy 08/15/2021   Moderate dementia (HCC) 07/25/2021   Anemia 06/13/2021   Hyperuricemia 10/09/2015   BPH associated with nocturia 10/09/2015   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/29/2014   Interval gout 07/29/2014   Diverticulitis of colon 07/29/2014   Long term current use of anticoagulant therapy 07/29/2014   Atrial fibrillation, controlled (HCC) 07/29/2014   Acid reflux 07/29/2014   PAST MEDICAL HISTORY:  Active Ambulatory Problems    Diagnosis Date Noted   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/29/2014   Interval gout 07/29/2014   Diverticulitis of colon 07/29/2014   Long term current use of anticoagulant therapy 07/29/2014   Atrial fibrillation, controlled (HCC) 07/29/2014   Acid reflux 07/29/2014   Hyperuricemia 10/09/2015   BPH associated with nocturia 10/09/2015   Acute metabolic encephalopathy 08/15/2021   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   Polypharmacy 08/17/2021   Benign prostatic hyperplasia with lower urinary tract symptoms  10/20/2021   Moderate dementia (HCC) 07/25/2021   Essential hypertension 10/20/2021   Anemia 06/13/2021   Altered mental status 06/11/2022   Acute UTI 06/13/2022   CVA (cerebral vascular accident) (HCC) 08/03/2022   Acute encephalopathy 08/03/2022   Resolved Ambulatory Problems    Diagnosis Date Noted   Warfarin therapy started 07/29/2014   Past Medical History:  Diagnosis Date   Arthritis    Atrial fibrillation (HCC)    GERD (gastroesophageal reflux disease)    Glaucoma    Gout    Hyperlipidemia    valvular heart disease    SOCIAL HX:  Social History   Tobacco Use   Smoking status: Former    Types: Cigars    Quit date: 1970    Years since quitting: 54.4   Smokeless tobacco: Never   Tobacco comments:    smoking cessation materials not required  Substance Use Topics   Alcohol use: Not Currently    Alcohol/week: 0.0 standard drinks of alcohol   FAMILY HX:  Family History  Problem Relation Age of Onset   Cancer Maternal Uncle    Alzheimer's disease Mother    Healthy Father    Healthy Sister    Healthy Sister    Healthy Sister    Heart disease Brother       ALLERGIES:  Allergies  Allergen Reactions   Ace Inhibitors    Penicillins Rash     PERTINENT MEDICATIONS:  Outpatient Encounter Medications as of 08/16/2022  Medication Sig   acetaminophen (TYLENOL) 500 MG tablet Take 500 mg by mouth every 6 (six) hours as  needed for mild pain or moderate pain.   allopurinol (ZYLOPRIM) 100 MG tablet Take 100 mg by mouth daily.   bisacodyl (DULCOLAX) 5 MG EC tablet Take 2 tablets (10 mg total) by mouth at bedtime as needed for moderate constipation.   Cholecalciferol 50 MCG (2000 UT) TABS Take 2,000 Units by mouth daily.   Cranberry 450 MG TABS Take 450 mg by mouth daily.   cyanocobalamin 1000 MCG tablet Take 1 tablet (1,000 mcg total) by mouth daily.   dorzolamide-timolol (COSOPT) 2-0.5 % ophthalmic solution Place 1 drop into both eyes 2 (two) times daily.   finasteride  (PROSCAR) 5 MG tablet Take 1 tablet (5 mg total) by mouth daily.   latanoprost (XALATAN) 0.005 % ophthalmic solution Place 1 drop into both eyes at bedtime. Dr Beverely Pace   melatonin 3 MG TABS tablet Take 3 mg by mouth at bedtime as needed.   metoprolol tartrate (LOPRESSOR) 25 MG tablet Take 0.5 tablets (12.5 mg total) by mouth 2 (two) times daily.   mupirocin ointment (BACTROBAN) 2 % Apply 1 Application topically 2 (two) times daily.   phenazopyridine (PYRIDIUM) 95 MG tablet Take 95 mg by mouth 3 (three) times daily as needed (urinary spasms).   polyethylene glycol (MIRALAX / GLYCOLAX) 17 g packet Take 17 g by mouth 2 (two) times daily.   risperiDONE (RISPERDAL) 0.5 MG tablet Take 0.5 mg by mouth at bedtime.   tamsulosin (FLOMAX) 0.4 MG CAPS capsule Take 0.4 mg by mouth daily.   Vitamin D, Ergocalciferol, (DRISDOL) 1.25 MG (50000 UNIT) CAPS capsule Take 1 capsule (50,000 Units total) by mouth every 7 (seven) days.   warfarin (COUMADIN) 5 MG tablet Take 5 mg by mouth daily.   No facility-administered encounter medications on file as of 08/16/2022.   Thank you for the opportunity to participate in the care of Mr. Pavese.  The palliative care team will continue to follow. Please call our office at [phone] if we can be of additional assistance.   Ninetta Adelstein Z Alaric Gladwin, NP ,

## 2022-09-09 ENCOUNTER — Ambulatory Visit (INDEPENDENT_AMBULATORY_CARE_PROVIDER_SITE_OTHER): Payer: Medicare Other | Admitting: Physician Assistant

## 2022-09-09 VITALS — BP 119/79 | HR 65 | Ht 70.0 in | Wt 181.6 lb

## 2022-09-09 DIAGNOSIS — R339 Retention of urine, unspecified: Secondary | ICD-10-CM | POA: Diagnosis not present

## 2022-09-09 DIAGNOSIS — Z435 Encounter for attention to cystostomy: Secondary | ICD-10-CM | POA: Diagnosis not present

## 2022-09-09 NOTE — Patient Instructions (Addendum)
Please continue with monthly suprapubic catheter exchanges, next one due around 7/28. I recommend using a 16Fr Silastic catheter to reduce the risk for encrustation. If he has recurrent encrustation issues, please come back and see [place] to consider upsizing his catheter or starting vinegar bladder instillations.

## 2022-09-09 NOTE — Progress Notes (Signed)
Suprapubic Cath Change  Patient is present today for a suprapubic catheter change due to urinary retention.  11ml of water was drained from the balloon, a 16FR Silicone foley cath was removed from the tract without difficulty.  Site was cleaned and prepped in a sterile fashion with betadine.  A 16FR Silastic foley cath was replaced into the tract no complications were noted. Urine return was not noted, 10 ml of sterile water was inflated into the balloon and a leg bag was attached for drainage.  Patient tolerated well.     Performed by: Carman Ching, PA-C   Additional notes: Patient to continue with monthly SPT changes by hospice services. Daughter reports his catheter stopped draining recently, suspect urinary encrustation. I recommend continuing Silastic catheters to reduce risk of encrustation. May consider upsizing to 18Fr Silastic in the future if he has persistent encrustation issues.  Follow up: Return if symptoms worsen or fail to improve.

## 2022-10-06 ENCOUNTER — Other Ambulatory Visit: Payer: Self-pay

## 2022-10-06 ENCOUNTER — Emergency Department
Admission: EM | Admit: 2022-10-06 | Discharge: 2022-10-06 | Disposition: A | Discharge status: Home / Self Care | Attending: Emergency Medicine | Admitting: Emergency Medicine

## 2022-10-06 ENCOUNTER — Encounter: Payer: Self-pay | Admitting: *Deleted

## 2022-10-06 ENCOUNTER — Emergency Department

## 2022-10-06 DIAGNOSIS — I4891 Unspecified atrial fibrillation: Secondary | ICD-10-CM | POA: Insufficient documentation

## 2022-10-06 DIAGNOSIS — T83010A Breakdown (mechanical) of cystostomy catheter, initial encounter: Secondary | ICD-10-CM | POA: Diagnosis present

## 2022-10-06 DIAGNOSIS — Y732 Prosthetic and other implants, materials and accessory gastroenterology and urology devices associated with adverse incidents: Secondary | ICD-10-CM | POA: Insufficient documentation

## 2022-10-06 LAB — CBC WITH DIFFERENTIAL/PLATELET
Abs Immature Granulocytes: 0.02 10*3/uL (ref 0.00–0.07)
Basophils Absolute: 0.1 10*3/uL (ref 0.0–0.1)
Basophils Relative: 1 %
Eosinophils Absolute: 0.4 10*3/uL (ref 0.0–0.5)
Eosinophils Relative: 5 %
HCT: 35.9 % — ABNORMAL LOW (ref 39.0–52.0)
Hemoglobin: 12.3 g/dL — ABNORMAL LOW (ref 13.0–17.0)
Immature Granulocytes: 0 %
Lymphocytes Relative: 20 %
Lymphs Abs: 1.5 10*3/uL (ref 0.7–4.0)
MCH: 32.7 pg (ref 26.0–34.0)
MCHC: 34.3 g/dL (ref 30.0–36.0)
MCV: 95.5 fL (ref 80.0–100.0)
Monocytes Absolute: 0.7 10*3/uL (ref 0.1–1.0)
Monocytes Relative: 9 %
Neutro Abs: 5.1 10*3/uL (ref 1.7–7.7)
Neutrophils Relative %: 65 %
Platelets: 276 10*3/uL (ref 150–400)
RBC: 3.76 MIL/uL — ABNORMAL LOW (ref 4.22–5.81)
RDW: 13.1 % (ref 11.5–15.5)
WBC: 7.8 10*3/uL (ref 4.0–10.5)
nRBC: 0 % (ref 0.0–0.2)

## 2022-10-06 LAB — BASIC METABOLIC PANEL
Anion gap: 7 (ref 5–15)
BUN: 21 mg/dL (ref 8–23)
CO2: 25 mmol/L (ref 22–32)
Calcium: 8.5 mg/dL — ABNORMAL LOW (ref 8.9–10.3)
Chloride: 105 mmol/L (ref 98–111)
Creatinine, Ser: 1.18 mg/dL (ref 0.61–1.24)
GFR, Estimated: 57 mL/min — ABNORMAL LOW (ref >60)
Glucose, Bld: 127 mg/dL — ABNORMAL HIGH (ref 70–99)
Potassium: 4.3 mmol/L (ref 3.5–5.1)
Sodium: 137 mmol/L (ref 135–145)

## 2022-10-06 LAB — URINALYSIS, ROUTINE W REFLEX MICROSCOPIC
Bilirubin Urine: NEGATIVE
Glucose, UA: NEGATIVE mg/dL
Ketones, ur: NEGATIVE mg/dL
Nitrite: NEGATIVE
Protein, ur: NEGATIVE mg/dL
RBC / HPF: >50 RBC/hpf (ref 0–5)
Specific Gravity, Urine: 1.013 (ref 1.005–1.030)
pH: 5 (ref 5.0–8.0)

## 2022-10-06 MED ORDER — SODIUM CHLORIDE 0.9 % IV BOLUS
1000.0000 mL | Freq: Once | INTRAVENOUS | Status: AC
  Administered 2022-10-06: 1000 mL via INTRAVENOUS

## 2022-10-06 NOTE — ED Notes (Signed)
First Nurse Note: Pt arrived via EMS from [facility]. 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 Pt has not had any urine output since yesterday.   Per EMS, VS are WNL

## 2022-10-06 NOTE — ED Notes (Signed)
Daughter updated about plan of care and change in catheter.  All questions answered.

## 2022-10-06 NOTE — ED Notes (Signed)
Reviewed pt's triage; acuity level changed and pt taken to exam room for further eval; care nurse Tiffany notified of need for bladder scan

## 2022-10-06 NOTE — ED Provider Notes (Signed)
[facility] Provider Note    Event Date/Time   First MD Initiated Contact with Patient 10/06/22 1920     (approximate)   History   Chief Complaint: urinary problems   HPI  Erik Larson is a 89 y.o. male with a history of atrial fibrillation, GERD, chronic urinary retention who was sent to the ED from assisted living due to no drainage from his suprapubic catheter.  Reportedly this was changed yesterday.  Patient denies any fever or vomiting or abdominal pain or any other complaints, states that he feels fine.     Physical Exam   Triage Vital Signs: ED Triage Vitals  Encounter Vitals Group     BP 10/06/22 1822 124/68     Systolic BP Percentile --      Diastolic BP Percentile --      Pulse Rate 10/06/22 1822 80     Resp 10/06/22 1822 14     Temp 10/06/22 1822 98.5 F (36.9 C)     Temp Source 10/06/22 1822 Oral     SpO2 10/06/22 1822 98 %     Weight 10/06/22 1830 177 lb 6.4 oz (80.5 kg)     Height --      Head Circumference --      Peak Flow --      Pain Score 10/06/22 1829 0     Pain Loc --      Pain Education --      Exclude from Growth Chart --     Most recent vital signs: Vitals:   10/06/22 2000 10/06/22 2030  BP: 132/75 133/64  Pulse: 64   Resp: 18   Temp:    SpO2: 100%     General: Awake, no distress.  CV:  Good peripheral perfusion.  Regular rate rhythm Resp:  Normal effort.  Abd:  No distention.  Soft, nontender.  Suprapubic catheter appears to be in place in the lower abdomen, no inflammatory changes around the catheter entry site Other:  Somewhat dry mucous membranes   ED Results / Procedures / Treatments   Labs (all labs ordered are listed, but only abnormal results are displayed) Labs Reviewed  CBC WITH DIFFERENTIAL/PLATELET - Abnormal; Notable for the following components:      Result Value   RBC 3.76 (*)    Hemoglobin 12.3 (*)    HCT 35.9 (*)    All other components within normal limits  BASIC METABOLIC PANEL  - Abnormal; Notable for the following components:   Glucose, Bld 127 (*)    Calcium 8.5 (*)    GFR, Estimated 57 (*)    All other components within normal limits  URINALYSIS, ROUTINE W REFLEX MICROSCOPIC - Abnormal; Notable for the following components:   Color, Urine YELLOW (*)    APPearance CLEAR (*)    Hgb urine dipstick MODERATE (*)    Leukocytes,Ua TRACE (*)    Bacteria, UA RARE (*)    All other components within normal limits     EKG    RADIOLOGY CT abdomen pelvis interpreted by me, shows the urinary catheter is not in the bladder, tip is in the abdominal wall.  No hydronephrosis.  Radiology report reviewed   PROCEDURES:  Procedures   MEDICATIONS ORDERED IN ED: Medications  sodium chloride 0.9 % bolus 1,000 mL (1,000 mLs Intravenous New Bag/Given 10/06/22 2025)     IMPRESSION / MDM / ASSESSMENT AND PLAN / ED COURSE  I reviewed the triage vital signs and the  nursing notes.  DDx: Dislodged catheter, catheter clogged, dehydration, AKI, electrolyte abnormality, UTI  Patient's presentation is most consistent with acute presentation with potential threat to life or bodily function.  Patient presents with lack of output in his urinary catheter.  Labs unremarkable.  CT scan shows that his suprapubic catheter is not in place.  I attempted to replace it, but the tract has closed.  Will place a Foley catheter for now for bladder drainage   ----------------------------------------- 10:30 PM on 10/06/2022 ----------------------------------------- Foley catheter inserted with bladder drainage,  urine negative for signs of infection.  Stable for discharge.  Follow-up with urology to discuss replacement of the suprapubic catheter.      FINAL CLINICAL IMPRESSION(S) / ED DIAGNOSES   Final diagnoses:  Suprapubic catheter dysfunction, initial encounter [facility])     Rx / DC Orders   ED Discharge Orders     None        Note:  This document was prepared using Dragon  voice recognition software and may include unintentional dictation errors.   Sharman Cheek, MD 10/06/22 2325

## 2022-10-06 NOTE — ED Triage Notes (Signed)
Here by [facility] from [facility]. Sent for decreased urine output despite new foley, anuria vs. retention. Foley changed yesterday. Last void 0300. New foley placed today, with no urine output. Pt alert, NAD, calm, confused at baseline. Pt has no complaint and doesn't know why he is here.  Information per [facility] staff via phone. No report received from EMS. DNR and MAR present.

## 2022-10-06 NOTE — Discharge Instructions (Addendum)
Your suprapubic catheter tract has closed up.  we placed a Foley catheter today to allow your bladder to drain.  Please follow-up with urology to discuss replacement of the suprapubic catheter.

## 2022-10-06 NOTE — ED Notes (Signed)
[facility] called for transport back to facility 

## 2022-10-06 NOTE — ED Notes (Signed)
Call and updated daughter.

## 2022-10-07 ENCOUNTER — Telehealth: Payer: Self-pay | Admitting: Physician Assistant

## 2022-10-07 DIAGNOSIS — R339 Retention of urine, unspecified: Secondary | ICD-10-CM

## 2022-10-07 NOTE — Telephone Encounter (Signed)
Patient was seen in the emergency department yesterday with nondraining SPT.  His catheter had been exchanged the day prior at his facility.  CT imaging showed malpositioned suprapubic catheter with Foley tip in his abdominal wall.  Unfortunately, his SP tract had closed, so urethral Foley was replaced.  Erik Larson spoke with Erik Larson daughter via telephone this morning to discuss SPT replacement with IR versus continuing with urethral Foley.  She prefers to keep urethral Foley with monthly exchanges at his facility, which is reasonable.  We asked her to contact us if she wishes to pursue SPT replacement with IR.

## 2022-10-11 ENCOUNTER — Ambulatory Visit (INDEPENDENT_AMBULATORY_CARE_PROVIDER_SITE_OTHER): Admitting: Physician Assistant

## 2022-10-11 VITALS — BP 91/62 | HR 88 | Temp 97.7°F | Ht 70.0 in | Wt 177.0 lb

## 2022-10-11 DIAGNOSIS — T8389XA Other specified complication of genitourinary prosthetic devices, implants and grafts, initial encounter: Secondary | ICD-10-CM

## 2022-10-11 DIAGNOSIS — N472 Paraphimosis: Secondary | ICD-10-CM | POA: Diagnosis not present

## 2022-10-11 DIAGNOSIS — N368 Other specified disorders of urethra: Secondary | ICD-10-CM

## 2022-10-11 NOTE — Progress Notes (Signed)
10/11/2022 4:57 PM   Erik Larson 05-28-1924 [id number]  CC: Chief Complaint  Patient presents with   Follow-up   HPI: Erik Larson is a 89 y.o. male with PMH chronic urinary retention previously managed with chronic Foley who underwent SPT placement with IR earlier this year after being found to have significant urethral erosion his SPT tract closed after malpositioned SPT placement last week who presents today for office follow-up.  He is accompanied today by his daughter, who contributes to HPI.   SPT was exchanged by hospice staff 6 days ago.  It did not drain for about 24 hours after placement and they ultimately went to the ED, where imaging revealed malpositioned suprapubic catheter with Foley tip within the abdominal wall.  SPT replacement was attempted in the ED, however his tract was found to have healed shut.  A urethral catheter was placed instead.  His daughter initially elected to keep his urethral catheter in place and defer SPT replacement, however they are here today because they have noticed penile swelling, pain, and bleeding from the penis and have decided they would like to pursue SPT replacement.  PMH: Past Medical History:  Diagnosis Date   Arthritis    Atrial fibrillation (HCC)    controlled on coumadin   GERD (gastroesophageal reflux disease)    Glaucoma    Gout    Hyperlipidemia    valvular heart disease    Warfarin therapy started 07/29/2014    Surgical History: Past Surgical History:  Procedure Laterality Date   COLONOSCOPY  2005 ?   GLAUCOMA SURGERY     IR CATHETER TUBE CHANGE  06/30/2022    Home Medications:  Allergies as of 10/11/2022       Reactions   Ace Inhibitors    Penicillins Rash        Medication List        Accurate as of October 11, 2022  4:57 PM. If you have any questions, ask your nurse or doctor.          acetaminophen 500 MG tablet Commonly known as: TYLENOL Take 500 mg by mouth every 6 (six) hours as needed  for mild pain or moderate pain.   allopurinol 100 MG tablet Commonly known as: ZYLOPRIM Take 100 mg by mouth daily.   bisacodyl 5 MG EC tablet Commonly known as: DULCOLAX Take 2 tablets (10 mg total) by mouth at bedtime as needed for moderate constipation.   Cholecalciferol 50 MCG (2000 UT) Tabs Take 2,000 Units by mouth daily.   Cranberry 450 MG Tabs Take 450 mg by mouth daily.   cyanocobalamin 1000 MCG tablet Take 1 tablet (1,000 mcg total) by mouth daily.   dorzolamide-timolol 2-0.5 % ophthalmic solution Commonly known as: COSOPT Place 1 drop into both eyes 2 (two) times daily.   finasteride 5 MG tablet Commonly known as: PROSCAR Take 1 tablet (5 mg total) by mouth daily.   hyoscyamine 0.125 MG Tbdp disintergrating tablet Commonly known as: ANASPAZ Place 0.125 mg under the tongue every 4 (four) hours as needed.   latanoprost 0.005 % ophthalmic solution Commonly known as: XALATAN Place 1 drop into both eyes at bedtime. Dr Beverely Pace   LORazepam 0.5 MG tablet Commonly known as: ATIVAN Take 0.5 mg by mouth. At bedtime or every 4 hours as needed for anxiety /agitation/ sleep/SOB/ N/V , / restlessness or control   melatonin 3 MG Tabs tablet Take 5 mg by mouth at bedtime as needed.   metoprolol tartrate  25 MG tablet Commonly known as: LOPRESSOR Take 0.5 tablets (12.5 mg total) by mouth 2 (two) times daily.   morphine CONCENTRATE 10 mg / 0.5 ml concentrated solution Take 10 mg by mouth every 2 (two) hours as needed for severe pain. For shortness of breath or pain control   mupirocin ointment 2 % Commonly known as: BACTROBAN Apply 1 Application topically 2 (two) times daily.   ondansetron 8 MG tablet Commonly known as: ZOFRAN Take 8 mg by mouth every 8 (eight) hours as needed.   phenazopyridine 95 MG tablet Commonly known as: PYRIDIUM Take 95 mg by mouth 3 (three) times daily as needed (urinary spasms).   polyethylene glycol 17 g packet Commonly known as: MIRALAX /  GLYCOLAX Take 17 g by mouth 2 (two) times daily.   risperiDONE 0.5 MG tablet Commonly known as: RISPERDAL Take 0.5 mg by mouth at bedtime.   sulfamethoxazole-trimethoprim 400-80 MG tablet Commonly known as: BACTRIM Take 1 tablet by mouth daily.   tamsulosin 0.4 MG Caps capsule Commonly known as: FLOMAX Take 0.4 mg by mouth daily.   Vitamin D (Ergocalciferol) 1.25 MG (50000 UNIT) Caps capsule Commonly known as: DRISDOL Take 1 capsule (50,000 Units total) by mouth every 7 (seven) days.   warfarin 5 MG tablet Commonly known as: COUMADIN Take 5 mg by mouth daily.        Allergies:  Allergies  Allergen Reactions   Ace Inhibitors    Penicillins Rash    Family History: Family History  Problem Relation Age of Onset   Cancer Maternal Uncle    Alzheimer's disease Mother    Healthy Father    Healthy Sister    Healthy Sister    Healthy Sister    Heart disease Brother     Social History:   reports that he quit smoking about 54 years ago. His smoking use included cigars. He has never used smokeless tobacco. He reports that he does not currently use alcohol. He reports that he does not use drugs.  Physical Exam: BP 91/62   Pulse 88   Temp 97.7 F (36.5 C)   Ht 5\' 10"  (1.778 m)   Wt 177 lb (80.3 kg)   BMI 25.40 kg/m   Constitutional:  Alert and oriented, no acute distress, nontoxic appearing HEENT: [place], AT Cardiovascular: No clubbing, cyanosis, or edema Respiratory: Normal respiratory effort, no increased work of breathing GU: Retracted foreskin, edematous glans penis and foreskin. Foley in place draining clear, yellow urine. Significant urethral erosion noted. Skin: No rashes, bruises or suspicious lesions Neurologic: Grossly intact, no focal deficits, moving all 4 extremities Psychiatric: Normal mood and affect  Assessment & Plan:   1. 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  Will coordinate this with IR and his hospice medical team to get medical clearance to hold Coumadin x 4 days.  Will tentatively plan for SPT replacement with IR on 10/17/2022.  Additionally, I noted that his StatLock was affixed very high on his left thigh which is pulling his penis and exacerbating his urethral erosion.  I reposition his StatLock to a more neutral position today.  2. Paraphimosis Noted on physical exam today, reduced.  Patient reported improved penile pain with this.  Return in about 6 days (around 10/17/2022) for SPT replacement with IR.  Carman Ching, PA-C  [facility] [place] [address], [address] [place],  [place] [address] [phone]

## 2022-10-11 NOTE — Telephone Encounter (Signed)
Patient daughter called in and left a message this morning. She states that patient is having pain and bleeding in his penis from catheter. She wants IR to due SPT. She was this to happen urgently. Please advise.

## 2022-10-12 ENCOUNTER — Other Ambulatory Visit: Payer: Self-pay | Admitting: Physician Assistant

## 2022-10-12 DIAGNOSIS — R339 Retention of urine, unspecified: Secondary | ICD-10-CM

## 2022-10-14 ENCOUNTER — Other Ambulatory Visit: Payer: Self-pay | Admitting: Student

## 2022-10-14 DIAGNOSIS — Z01812 Encounter for preprocedural laboratory examination: Secondary | ICD-10-CM

## 2022-10-14 DIAGNOSIS — Z9189 Other specified personal risk factors, not elsewhere classified: Secondary | ICD-10-CM

## 2022-10-14 NOTE — Progress Notes (Signed)
Patient for IR SPT Insertion on Monday 10/17/2022, I called and spoke with the patient's daughter, [name] on the phone and gave pre-procedure instructions. [name] was made aware to have the patient here at 9:30a, last dose of Coumadin was Wed 10/12/22, NPO after MN prior to procedure as well as driver post procedure/recovery/discharge. [name] stated understanding.  Called 10/13/2022

## 2022-10-17 ENCOUNTER — Ambulatory Visit
Admission: RE | Admit: 2022-10-17 | Discharge: 2022-10-17 | Disposition: A | Discharge status: Home / Self Care | Source: Ambulatory Visit | Attending: Physician Assistant | Admitting: Physician Assistant

## 2022-10-17 ENCOUNTER — Other Ambulatory Visit: Payer: Self-pay | Admitting: Physician Assistant

## 2022-10-17 DIAGNOSIS — I4891 Unspecified atrial fibrillation: Secondary | ICD-10-CM | POA: Insufficient documentation

## 2022-10-17 DIAGNOSIS — R339 Retention of urine, unspecified: Secondary | ICD-10-CM | POA: Diagnosis present

## 2022-10-17 DIAGNOSIS — Z7901 Long term (current) use of anticoagulants: Secondary | ICD-10-CM | POA: Insufficient documentation

## 2022-10-17 DIAGNOSIS — N401 Enlarged prostate with lower urinary tract symptoms: Secondary | ICD-10-CM | POA: Insufficient documentation

## 2022-10-17 DIAGNOSIS — I2510 Atherosclerotic heart disease of native coronary artery without angina pectoris: Secondary | ICD-10-CM | POA: Diagnosis not present

## 2022-10-17 DIAGNOSIS — Z01812 Encounter for preprocedural laboratory examination: Secondary | ICD-10-CM

## 2022-10-17 HISTORY — PX: IR US GUIDE BX ASP/DRAIN: IMG2392

## 2022-10-17 LAB — CBC
HCT: 34.6 % — ABNORMAL LOW (ref 39.0–52.0)
Hemoglobin: 11.9 g/dL — ABNORMAL LOW (ref 13.0–17.0)
MCH: 33 pg (ref 26.0–34.0)
MCHC: 34.4 g/dL (ref 30.0–36.0)
MCV: 95.8 fL (ref 80.0–100.0)
Platelets: 258 10*3/uL (ref 150–400)
RBC: 3.61 MIL/uL — ABNORMAL LOW (ref 4.22–5.81)
RDW: 13.1 % (ref 11.5–15.5)
WBC: 6.6 10*3/uL (ref 4.0–10.5)
nRBC: 0 % (ref 0.0–0.2)

## 2022-10-17 LAB — PROTIME-INR
INR: 1.2 (ref 0.8–1.2)
Prothrombin Time: 14.9 seconds (ref 11.4–15.2)

## 2022-10-17 MED ORDER — LIDOCAINE VISCOUS HCL 2 % MT SOLN
OROMUCOSAL | Status: AC
  Filled 2022-10-17: qty 15

## 2022-10-17 MED ORDER — IOHEXOL 300 MG/ML  SOLN
4.0000 mL | Freq: Once | INTRAMUSCULAR | Status: AC | PRN
  Administered 2022-10-17: 4 mL

## 2022-10-17 MED ORDER — MIDAZOLAM HCL 2 MG/2ML IJ SOLN
INTRAMUSCULAR | Status: AC
  Filled 2022-10-17: qty 2

## 2022-10-17 MED ORDER — SODIUM CHLORIDE 0.9 % IV SOLN: INTRAVENOUS | Status: DC

## 2022-10-17 MED ORDER — FENTANYL CITRATE (PF) 100 MCG/2ML IJ SOLN
INTRAMUSCULAR | Status: AC | PRN
  Administered 2022-10-17 (×2): 25 ug via INTRAVENOUS

## 2022-10-17 MED ORDER — MIDAZOLAM HCL 2 MG/2ML IJ SOLN
INTRAMUSCULAR | Status: AC | PRN
  Administered 2022-10-17: .5 mg via INTRAVENOUS

## 2022-10-17 MED ORDER — LIDOCAINE HCL 1 % IJ SOLN
INTRAMUSCULAR | Status: AC
  Filled 2022-10-17: qty 20

## 2022-10-17 MED ORDER — LIDOCAINE HCL (PF) 1 % IJ SOLN: 10.0000 mL | Freq: Once | INTRAMUSCULAR | Status: DC

## 2022-10-17 MED ORDER — FENTANYL CITRATE (PF) 100 MCG/2ML IJ SOLN
INTRAMUSCULAR | Status: AC
  Filled 2022-10-17: qty 2

## 2022-10-17 NOTE — Procedures (Signed)
Vascular and Interventional Radiology Procedure Note  Patient: Erik Larson DOB: October 24, 1924 Medical Record Number: [id number] Note Date/Time: 10/17/22 10:16 AM   Performing Physician: Roanna Banning, MD Assistant(s): None  Diagnosis: Catheter fell out. New placement for chronic urinary retention  Procedure: SUPRAPUBIC CYSTOSTOMY TUBE PLACEMENT  Anesthesia: Conscious Sedation Complications: None Estimated Blood Loss:  0 mL  Findings:  Successful placement of a 52F suprapubic cystostomy tube under fluoroscopy.  Plan: Return to VIR for upsize and exchange to 10F Council foley in 4 wks   See detailed procedure note with images in PACS. The patient tolerated the procedure well without incident or complication and was returned to Recovery in stable condition.    Roanna Banning, MD Vascular and Interventional Radiology Specialists [facility] Radiology   Pager. [phone] Clinic. [phone]

## 2022-10-17 NOTE — Progress Notes (Signed)
Patient clinically stable post 14 FR SPT placement with vitals stable draining pink tinged urine. Daughter present with discharge instructions given as well as questions answered. Instructed to restart coumadin tomorrow, as on paper work for home. Vitals stable received Versed 0.5 mg along with Fentanyl 50 mcg IV for procedure.

## 2022-10-17 NOTE — Progress Notes (Addendum)
Vascular and Interventional Radiology  PRE PROCEDURE H&P  Assessment  Plan:   [name] is a 89 y.o. year old male who will undergo SUPRAPUBIC CYSTOSTOMY CATHETER PLACEMENT in Interventional Radiology.  The procedure has been fully reviewed with the patient/patient's authorized representative. The risks, benefits and alternatives have been explained, and the patient/patient's authorized representative has consented to the procedure.   HPI: [name] is a 89 y.o. year old male w PMHx significant for CAD, Afib on warfarin and BPH w urinary retention. Pt is known to VIR service s/p suprapubic cystostomy catheter placement on 06/07/22 followed by exchange and upsize to 80F Council tip catheter on 06/30/22. Pt is a resident at an ALF and his most recent exchange was inadvertently placed outside the urinary bladder and the catheter was therefore removed. He represents today for new catheter placement.  Review of Systems: A 12-point ROS discussed, and pertinent positives are indicated in the HPI above.  All other systems are negative.   Informed consent was obtained, witnessed and placed in the patient's chart.  Allergies:  Allergies  Allergen Reactions   Ace Inhibitors    Penicillins Rash    Medications:  Current Outpatient Medications on File Prior to Encounter  Medication Sig Dispense Refill   acetaminophen (TYLENOL) 500 MG tablet Take 500 mg by mouth every 6 (six) hours as needed for mild pain or moderate pain.     allopurinol (ZYLOPRIM) 100 MG tablet Take 100 mg by mouth daily.     bisacodyl (DULCOLAX) 5 MG EC tablet Take 2 tablets (10 mg total) by mouth at bedtime as needed for moderate constipation. 30 tablet 0   Cholecalciferol 50 MCG (2000 UT) TABS Take 2,000 Units by mouth daily.     Cranberry 450 MG TABS Take 450 mg by mouth daily.     cyanocobalamin 1000 MCG tablet Take 1 tablet (1,000 mcg total) by mouth daily. 30 tablet 2   dorzolamide-timolol (COSOPT) 2-0.5 %  ophthalmic solution Place 1 drop into both eyes 2 (two) times daily.     finasteride (PROSCAR) 5 MG tablet Take 1 tablet (5 mg total) by mouth daily. 90 tablet 1   hyoscyamine (ANASPAZ) 0.125 MG TBDP disintergrating tablet Place 0.125 mg under the tongue every 4 (four) hours as needed.     latanoprost (XALATAN) 0.005 % ophthalmic solution Place 1 drop into both eyes at bedtime. Dr Erik Larson     LORazepam (ATIVAN) 0.5 MG tablet Take 0.5 mg by mouth. At bedtime or every 4 hours as needed for anxiety /agitation/ sleep/SOB/ N/V , / restlessness or control     melatonin 3 MG TABS tablet Take 5 mg by mouth at bedtime as needed.     metoprolol tartrate (LOPRESSOR) 25 MG tablet Take 0.5 tablets (12.5 mg total) by mouth 2 (two) times daily. 30 tablet 2   Morphine Sulfate (MORPHINE CONCENTRATE) 10 mg / 0.5 ml concentrated solution Take 10 mg by mouth every 2 (two) hours as needed for severe pain. For shortness of breath or pain control     mupirocin ointment (BACTROBAN) 2 % Apply 1 Application topically 2 (two) times daily.     ondansetron (ZOFRAN) 8 MG tablet Take 8 mg by mouth every 8 (eight) hours as needed.     phenazopyridine (PYRIDIUM) 95 MG tablet Take 95 mg by mouth 3 (three) times daily as needed (urinary spasms).     polyethylene glycol (MIRALAX / GLYCOLAX) 17 g packet Take 17 g by mouth  2 (two) times daily. 14 each 0   risperiDONE (RISPERDAL) 0.5 MG tablet Take 0.5 mg by mouth at bedtime.     sulfamethoxazole-trimethoprim (BACTRIM) 400-80 MG tablet Take 1 tablet by mouth daily.     tamsulosin (FLOMAX) 0.4 MG CAPS capsule Take 0.4 mg by mouth daily.     Vitamin D, Ergocalciferol, (DRISDOL) 1.25 MG (50000 UNIT) CAPS capsule Take 1 capsule (50,000 Units total) by mouth every 7 (seven) days. 4 capsule 2   warfarin (COUMADIN) 5 MG tablet Take 5 mg by mouth daily.     No current facility-administered medications on file prior to encounter.    PSH:  Past Surgical History:  Procedure Laterality Date    COLONOSCOPY  2005 ?   GLAUCOMA SURGERY     IR CATHETER TUBE CHANGE  06/30/2022    PMH:  Past Medical History:  Diagnosis Date   Arthritis    Atrial fibrillation (HCC)    controlled on coumadin   GERD (gastroesophageal reflux disease)    Glaucoma    Gout    Hyperlipidemia    valvular heart disease    Warfarin therapy started 07/29/2014    Brief Physical Examination: Vitals:   10/17/22 0926  BP: (!) 148/76  Pulse: 78  Resp: 14  Temp: 98.3 F (36.8 C)  SpO2: 95%   General: WD male in NAD HEENT: Normocephalic, atraumatic Lungs: Respirations non-labored  ASA Grade: 3 Mallampati Class: 3   Erik Banning, MD Vascular and Interventional Radiology Specialists [facility] Radiology   Pager. [phone] Clinic. [phone]  I spent a total of 25 Minutes of face-to-face time in clinical consultation, greater than 50% of which was counseling/coordinating care for Mr Erik Larson evaluation for catheter placement.

## 2022-11-16 NOTE — H&P (Incomplete)
Chief Complaint: Patient was seen in consultation today for suprapubic catheter exchange at the request of Mugweru,Jon  Referring Physician(s): Mugweru,Jon  Supervising Physician: Gilmer Mor  Patient Status: [facility] - Out-pt  History of Present Illness: Erik Larson is a 89 y.o. male with PMHx significant for CAD, atrial fibrillation on coumadin, BPH with urinary retention, UTI/bacteremia and urethral erosion. The patient follows closely with urology and was previously seen by our group for suprapubic catheter placement 06/07/22 with exchange/upsize to 60F council foley on 06/30/22. Most recently the patient had a new placement of a suprapubic cathter with our service on 8/5 secondary to losing access with the previous catheter when it was advertently placed outside the bladder during an exchange with hospice staff as an outpatient. He is here today for his 4-6 week suprapubic catheter exchange and upsize to a 60F council tip foley.   Past Medical History:  Diagnosis Date   Arthritis    Atrial fibrillation (HCC)    controlled on coumadin   GERD (gastroesophageal reflux disease)    Glaucoma    Gout    Hyperlipidemia    valvular heart disease    Warfarin therapy started 07/29/2014    Past Surgical History:  Procedure Laterality Date   COLONOSCOPY  2005 ?   GLAUCOMA SURGERY     IR CATHETER TUBE CHANGE  06/30/2022   IR US GUIDE BX ASP/DRAIN  10/17/2022    Allergies: Ace inhibitors and Penicillins  Medications: Prior to Admission medications   Medication Sig Start Date End Date Taking? Authorizing Provider  acetaminophen (TYLENOL) 500 MG tablet Take 500 mg by mouth every 6 (six) hours as needed for mild pain or moderate pain.    Smiley Houseman, NP  allopurinol (ZYLOPRIM) 100 MG tablet Take 100 mg by mouth daily. 11/08/21   [provider]  bisacodyl (DULCOLAX) 5 MG EC tablet Take 2 tablets (10 mg total) by mouth at bedtime as needed for moderate constipation. 08/10/22    Gillis Santa, MD  Cholecalciferol 50 MCG (2000 UT) TABS Take 2,000 Units by mouth daily.    Smiley Houseman, NP  Cranberry 450 MG TABS Take 450 mg by mouth daily.    Smiley Houseman, NP  dorzolamide-timolol (COSOPT) 2-0.5 % ophthalmic solution Place 1 drop into both eyes 2 (two) times daily.    [provider]  finasteride (PROSCAR) 5 MG tablet Take 1 tablet (5 mg total) by mouth daily. 07/07/17   Duanne Limerick, MD  hyoscyamine (ANASPAZ) 0.125 MG TBDP disintergrating tablet Place 0.125 mg under the tongue every 4 (four) hours as needed.    [provider]  latanoprost (XALATAN) 0.005 % ophthalmic solution Place 1 drop into both eyes at bedtime. Dr Beverely Pace      LORazepam (ATIVAN) 0.5 MG tablet Take 0.5 mg by mouth. At bedtime or every 4 hours as needed for anxiety /agitation/ sleep/SOB/ N/V , / restlessness or control 10/05/22   [provider]  melatonin 3 MG TABS tablet Take 5 mg by mouth at bedtime as needed.    [provider]  metoprolol tartrate (LOPRESSOR) 25 MG tablet Take 0.5 tablets (12.5 mg total) by mouth 2 (two) times daily. 08/10/22 11/08/22  Gillis Santa, MD  Morphine Sulfate (MORPHINE CONCENTRATE) 10 mg / 0.5 ml concentrated solution Take 10 mg by mouth every 2 (two) hours as needed for severe pain. For shortness of breath or pain control    [provider]  mupirocin ointment (BACTROBAN) 2 %  Apply 1 Application topically 2 (two) times daily.    [provider]  ondansetron (ZOFRAN) 8 MG tablet Take 8 mg by mouth every 8 (eight) hours as needed. 08/17/22   [provider]  phenazopyridine (PYRIDIUM) 95 MG tablet Take 95 mg by mouth 3 (three) times daily as needed (urinary spasms).    Smiley Houseman, NP  polyethylene glycol (MIRALAX / GLYCOLAX) 17 g packet Take 17 g by mouth 2 (two) times daily. 08/10/22   Gillis Santa, MD  risperiDONE (RISPERDAL) 0.5 MG tablet Take 0.5 mg by mouth at bedtime. 09/18/21   [provider]   sulfamethoxazole-trimethoprim (BACTRIM) 400-80 MG tablet Take 1 tablet by mouth daily. 09/14/22   [provider]  tamsulosin (FLOMAX) 0.4 MG CAPS capsule Take 0.4 mg by mouth daily. 06/11/22   [provider]  warfarin (COUMADIN) 5 MG tablet Take 5 mg by mouth daily.    Smiley Houseman, NP     Family History  Problem Relation Age of Onset   Cancer Maternal Uncle    Alzheimer's disease Mother    Healthy Father    Healthy Sister    Healthy Sister    Healthy Sister    Heart disease Brother     Social History   Socioeconomic History   Marital status: Married    Spouse name: Research scientist (physical sciences)   Number of children: 2   Years of education: Not on file   Highest education level: 10th grade  Occupational History   Occupation: Retired  Tobacco Use   Smoking status: Former    Types: Cigars    Quit date: 1970    Years since quitting: 54.7   Smokeless tobacco: Never   Tobacco comments:    smoking cessation materials not required  Vaping Use   Vaping status: Never Used  Substance and Sexual Activity   Alcohol use: Not Currently    Alcohol/week: 0.0 standard drinks of alcohol   Drug use: No   Sexual activity: Not Currently  Other Topics Concern   Not on file  Social History Narrative   Not on file   Social Determinants of Health   Financial Resource Strain: Low Risk  (06/15/2021)   Received from [facility], [facility]   Overall Financial Resource Strain (CARDIA)    Difficulty of Paying Living Expenses: Not hard at all  Food Insecurity: No Food Insecurity (08/04/2022)   Hunger Vital Sign    Worried About Running Out of Food in the Last Year: Never true    Ran Out of Food in the Last Year: Never true  Transportation Needs: No Transportation Needs (08/04/2022)   PRAPARE - Administrator, Civil Service (Medical): No    Lack of Transportation (Non-Medical): No  Physical Activity: Inactive (07/12/2017)   Exercise Vital Sign    Days of Exercise per Week:  0 days    Minutes of Exercise per Session: 0 min  Stress: No Stress Concern Present (07/12/2017)   [employer] of Occupational Health - Occupational Stress Questionnaire    Feeling of Stress : Not at all  Social Connections: Unknown (07/12/2017)   Social Connection and Isolation Panel [NHANES]    Frequency of Communication with Friends and Family: Patient declined    Frequency of Social Gatherings with Friends and Family: Patient declined    Attends Religious Services: Patient declined    Database administrator or Organizations: Patient declined    Attends Banker Meetings: Patient declined  Marital Status: Married    Review of Systems: A 12 point ROS discussed and pertinent positives are indicated in the HPI above.  All other systems are negative.  Review of Systems  Vital Signs: There were no vitals taken for this visit.   Physical Exam  Imaging: IR US Guide Bx Asp/Drain  Result Date: 10/17/2022 INDICATION: Urinary retention.  Prior IR SPT placed 06/07/2022. EXAM: ULTRASOUND AND FLUOROSCOPIC-GUIDED SUPRAPUBIC CATHETER PLACEMENT COMPARISON:  IR fluoroscopy, 06/07/2022 and 06/30/2022. CT AP, 10/06/2022. MEDICATIONS: None ANESTHESIA/SEDATION: Moderate (conscious) sedation was employed during this procedure. A total of Versed 0.5 mg and Fentanyl 50 mcg was administered intravenously. Moderate Sedation Time: 10 minutes. The patient's level of consciousness and vital signs were monitored continuously by radiology nursing throughout the procedure under my direct supervision. FLUOROSCOPY TIME:  Fluoroscopic dose; 3 mGy CONTRAST:  4 mL Omnipaque 300 COMPLICATIONS: None immediate. PROCEDURE: The procedure, risks, benefits, and alternatives were explained to the patient. Questions regarding the procedure were encouraged and answered. The patient and/or patient's representative understands and consents to the procedure. A timeout was performed prior to the initiation of the  procedure. The patient was positioned supine. Pre procedural imaging was obtained of the lower pelvis. The skin overlying the anterior aspect the lower pelvis was prepped and draped in usual sterile fashion. The patient's the urinary bladder was then distended with the administration of approximately 150 mL of saline via the existing Foley catheter. Under direct ultrasound guidance, a 22 gauge needle was utilized for the purposes of procedural planning after the overlying soft tissues were anesthetized with 1% lidocaine. Next, an 18 gauge trocar needle was advanced into the urinary bladder under direct ultrasound guidance. Ultrasound image was saved for procedural documentation purposes. A short Amplatz wire was coiled within the urinary bladder. Appropriate position was confirmed with fluoroscopy. The track was serially dilated ultimately allowing placement of a 14 Fr drainage catheter with end coiled and locked within the urinary bladder. The catheter was connected to a gravity bag. The exit site of the catheter was secured within interrupted suture. A dressing was placed. The patient tolerated the procedure well without immediate postprocedural complication. FINDINGS: There is no bowel interposed between the anterior aspect of the urinary bladder and the ventral lower abdominal/pelvic wall. After distension of the urinary bladder with the administration of saline via the Foley catheter, ultrasound and fluoroscopy was utilized for the placement of a 14 Fr percutaneous drainage catheter with end coiled and locked within the urinary bladder. IMPRESSION: Successful ultrasound and fluoroscopic-guided placement of a 14 Fr suprapubic cystostomy catheter. PLAN: The patient will return to Vascular Interventional Radiology (VIR) for routine drainage catheter evaluation and upsize to 16 Fr Council tip Foley in 4-6 weeks. Roanna Banning, MD [facility] Electronically  Signed   By: Roanna Banning M.D.   On: 10/17/2022 12:20    Labs:  CBC: Recent Labs    08/08/22 0457 08/09/22 0437 10/06/22 1826 10/17/22 0929  WBC 6.3 6.4 7.8 6.6  HGB 11.2* 11.6* 12.3* 11.9*  HCT 32.9* 35.0* 35.9* 34.6*  PLT 218 238 276 258    COAGS: Recent Labs    08/03/22 1307 08/04/22 0521 08/08/22 0457 08/09/22 0437 08/10/22 0350 10/17/22 0929  INR 2.0*   < > 1.6* 1.7* 1.7* 1.2  APTT 37*  --   --   --   --   --    < > = values in this interval not displayed.    BMP:  Recent Labs    08/08/22 0457 08/09/22 0437 08/10/22 0350 10/06/22 1826  NA 137 139 140 137  K 3.9 3.8 4.4 4.3  CL 106 104 107 105  CO2 26 25 25 25   GLUCOSE 93 118* 107* 127*  BUN 23 21 30* 21  CALCIUM 8.2* 8.7* 8.6* 8.5*  CREATININE 0.83 0.90 0.98 1.18  GFRNONAA >60 >60 >60 57*    LIVER FUNCTION TESTS: Recent Labs    06/11/22 1950 06/12/22 0616 08/03/22 1307  BILITOT 0.5 0.5 0.9  AST 26 23 22   ALT 14 12 15   ALKPHOS 104 91 86  PROT 7.2 6.7 7.7  ALBUMIN 3.3* 3.1* 3.9    Assessment and Plan: This is a 89 year old male with PMHx significant for CAD, atrial fibrillation on coumadin, BPH with urinary retention, UTI/bacteremia and urethral erosion. The patient follows closely with urology and was previously seen by our group for suprapubic catheter placement 06/07/22 with exchange/upsize to 82F council foley on 06/30/22. Most recently the patient had a new placement of a suprapubic cathter with our service on 8/5 secondary to losing access with the previous catheter when it was advertently placed outside the bladder during an exchange with hospice staff as an outpatient. He is here today for his 4-6 week suprapubic catheter exchange and upsize to a 82F council tip foley.   The patient has been NPO, imaging and vitals have been reviewed.  Risks and benefits of suprapubic catheter exchange/upsize was discussed with the patient and/or patient's family including, but not limited to bleeding,  infection, damage to adjacent structures.  All of the questions were answered and there is agreement to proceed.  Consent signed and in chart.    Thank you for this interesting consult.  I greatly enjoyed meeting MACDONALD RAZZAK and look forward to participating in their care.  A copy of this report was sent to the requesting provider on this date.  Electronically Signed: Berneta Levins, PA-C 11/16/2022, 8:28 AM   I spent a total of  15 Minutes in face to face in clinical consultation, greater than 50% of which was counseling/coordinating care for suprapubic catheter exchange.

## 2022-11-16 NOTE — Progress Notes (Signed)
Patient for IR Cath Tube Exchange on Thurs 11/17/22, I called and spoke with the patient's daughter, [name] on the phone and gave pre-procedure instructions. [name] was made aware to have the patient here at 1:45p. [name] stated understanding.  Called 10/28/2022

## 2022-11-17 ENCOUNTER — Other Ambulatory Visit: Payer: Self-pay | Admitting: Interventional Radiology

## 2022-11-17 ENCOUNTER — Ambulatory Visit
Admission: RE | Admit: 2022-11-17 | Discharge: 2022-11-17 | Disposition: A | Discharge status: Home / Self Care | Source: Ambulatory Visit | Attending: Interventional Radiology | Admitting: Interventional Radiology

## 2022-11-17 DIAGNOSIS — Z435 Encounter for attention to cystostomy: Secondary | ICD-10-CM | POA: Insufficient documentation

## 2022-11-17 DIAGNOSIS — R339 Retention of urine, unspecified: Secondary | ICD-10-CM | POA: Diagnosis not present

## 2022-11-17 HISTORY — PX: IR CATHETER TUBE CHANGE: IMG717

## 2022-11-17 MED ORDER — IOHEXOL 300 MG/ML  SOLN
10.0000 mL | Freq: Once | INTRAMUSCULAR | Status: AC | PRN
  Administered 2022-11-17: 10 mL

## 2022-11-17 MED ORDER — LIDOCAINE HCL 1 % IJ SOLN
INTRAMUSCULAR | Status: AC
  Filled 2022-11-17: qty 20

## 2022-11-17 MED ORDER — LIDOCAINE VISCOUS HCL 2 % MT SOLN
OROMUCOSAL | Status: AC
  Filled 2022-11-17: qty 15

## 2022-11-17 MED ORDER — LIDOCAINE HCL 1 % IJ SOLN
6.0000 mL | Freq: Once | INTRAMUSCULAR | Status: AC
  Administered 2022-11-17: 6 mL via INTRADERMAL

## 2022-11-17 MED ORDER — LIDOCAINE VISCOUS HCL 2 % MT SOLN
5.0000 mL | Freq: Once | OROMUCOSAL | Status: AC
  Administered 2022-11-17: 5 mL via TOPICAL

## 2022-11-17 NOTE — Procedures (Signed)
Interventional Radiology Procedure Note  Procedure: Image guided attempt at council tip catheter placement. Unable to place the urinary catheter secondary to tight/immature tract.  A 75F pigtail drain was placed.  Findings: Tight, immature tract.  Balloon dilated to 9mm, roughly equivalent to 88F.  The tract would not accept the blunt tip catheter despite maneuvers.   Complications: None  EBL: minimal  Recommendations: - Routine drain care, - Will need another attempt at council tip placement.  Wound wait at least 6-8 weeks - ice and OTC's prn  - discussed with his family  Signed,  Yvone Neu. Loreta Ave, DO, ABVM, RPVI

## 2022-11-21 NOTE — Group Note (Deleted)

## 2022-12-27 ENCOUNTER — Other Ambulatory Visit: Payer: Self-pay | Admitting: Student

## 2022-12-27 DIAGNOSIS — Z01812 Encounter for preprocedural laboratory examination: Secondary | ICD-10-CM

## 2022-12-27 NOTE — Progress Notes (Signed)
Patient for IR Cath Tube Exchange on Wed 12/28/2022, I called and spoke with the patient's daughter, [name] on the phone and gave pre-procedure instructions. [name] was made aware to have the patient here at 10:30a, NPO after MN prior to procedure as well as driver post procedure/recovery/discharge. [name] stated understanding.  Called 12/20/2022

## 2022-12-28 ENCOUNTER — Ambulatory Visit
Admission: RE | Admit: 2022-12-28 | Discharge: 2022-12-28 | Disposition: A | Discharge status: Home / Self Care | Payer: Medicare Other | Source: Ambulatory Visit | Attending: Interventional Radiology | Admitting: Interventional Radiology

## 2023-01-09 NOTE — Progress Notes (Signed)
Patient for IR Cath Tube Change on Tues 01/10/2023, I called and spoke with the patient's daughter, [name] on the phone and gave pre-procedure instructions. [name] was made aware to have the Patient here here at 8:30a, NPO after MN prior to procedure as well as driver post procedure/recovery/discharge. [name] stated understanding.  Called 12/28/2022 and 01/06/2023

## 2023-01-10 ENCOUNTER — Ambulatory Visit
Admission: RE | Admit: 2023-01-10 | Discharge: 2023-01-10 | Disposition: A | Discharge status: Home / Self Care | Payer: Medicare Other | Source: Ambulatory Visit | Attending: Interventional Radiology | Admitting: Interventional Radiology

## 2023-01-10 ENCOUNTER — Other Ambulatory Visit: Payer: Self-pay

## 2023-01-10 DIAGNOSIS — Z435 Encounter for attention to cystostomy: Secondary | ICD-10-CM | POA: Insufficient documentation

## 2023-01-10 DIAGNOSIS — Z7901 Long term (current) use of anticoagulants: Secondary | ICD-10-CM | POA: Diagnosis not present

## 2023-01-10 DIAGNOSIS — Z01812 Encounter for preprocedural laboratory examination: Secondary | ICD-10-CM

## 2023-01-10 DIAGNOSIS — R339 Retention of urine, unspecified: Secondary | ICD-10-CM | POA: Insufficient documentation

## 2023-01-10 DIAGNOSIS — I4891 Unspecified atrial fibrillation: Secondary | ICD-10-CM | POA: Diagnosis not present

## 2023-01-10 DIAGNOSIS — N401 Enlarged prostate with lower urinary tract symptoms: Secondary | ICD-10-CM | POA: Diagnosis not present

## 2023-01-10 HISTORY — PX: IR CATHETER TUBE CHANGE: IMG717

## 2023-01-10 LAB — CBC
HCT: 36.1 % — ABNORMAL LOW (ref 39.0–52.0)
Hemoglobin: 11.8 g/dL — ABNORMAL LOW (ref 13.0–17.0)
MCH: 31.6 pg (ref 26.0–34.0)
MCHC: 32.7 g/dL (ref 30.0–36.0)
MCV: 96.5 fL (ref 80.0–100.0)
Platelets: 430 10*3/uL — ABNORMAL HIGH (ref 150–400)
RBC: 3.74 MIL/uL — ABNORMAL LOW (ref 4.22–5.81)
RDW: 13 % (ref 11.5–15.5)
WBC: 11.2 10*3/uL — ABNORMAL HIGH (ref 4.0–10.5)
nRBC: 0 % (ref 0.0–0.2)

## 2023-01-10 MED ORDER — IOHEXOL 300 MG/ML  SOLN
50.0000 mL | Freq: Once | INTRAMUSCULAR | Status: AC | PRN
  Administered 2023-01-10: 10 mL

## 2023-01-10 MED ORDER — SODIUM CHLORIDE 0.9% FLUSH: 3.0000 mL | Freq: Two times a day (BID) | INTRAVENOUS | Status: DC

## 2023-01-10 MED ORDER — LIDOCAINE HCL 1 % IJ SOLN
INTRAMUSCULAR | Status: AC
  Filled 2023-01-10: qty 20

## 2023-01-10 MED ORDER — FENTANYL CITRATE (PF) 100 MCG/2ML IJ SOLN
INTRAMUSCULAR | Status: AC
  Filled 2023-01-10: qty 2

## 2023-01-10 MED ORDER — SODIUM CHLORIDE 0.9 % IV SOLN: INTRAVENOUS | Status: DC

## 2023-01-10 MED ORDER — LIDOCAINE VISCOUS HCL 2 % MT SOLN
OROMUCOSAL | Status: AC
  Filled 2023-01-10: qty 15

## 2023-01-10 NOTE — Progress Notes (Signed)
Patient clinically unchanged post procedure, as very sommulent  pre and post procedure. Only local agent used for procedure. Did not arouse but minimal for change of SPT tube. Denies complaints post procedure. Update given to daughter post procedure. Vitals unchanged.

## 2023-01-10 NOTE — Procedures (Signed)
Interventional Radiology Procedure Note  Procedure: Suprapubic catheter exchange  Complications: None  Estimated Blood Loss: None  Findings: 16 Fr pigtail SPT exchanged over wire under fluoro for a 16 Fr Council Foley catheter. Tip in bladder. Retention balloon inflated with 10 mL of saline.  Jodi Marble. Fredia Sorrow, M.D Pager:  [phone]

## 2023-01-10 NOTE — H&P (Signed)
Chief Complaint: Patient was seen in consultation today for urinary retention  Referring Physician(s): Irineo Axon, MD  Supervising Physician: Irish Lack  Patient Status: [facility] - Out-pt  History of Present Illness: Erik Larson is a 89 y.o. male  with a medical history significant for atrial fibrillation (Coumadin), heart disease and BPH with urinary retention. He has been followed by Urology for several years and was hospitalized last year for UTI/Bacteremia and has required an indwelling foley catheter for urinary retention since that time. He developed urethral erosion and IR was asked to evaluate patient for a suprapubic catheter placement. This was performed 06/07/22 and he returned 06/30/22 for an exchange/upsize.    In late July his catheter was exchanged at his nursing facility and was not placed in the correct position leading to the tract closing up. The patient returned to IR 10/11/22 and underwent new 14 Fr SPT placement. He returned 11/17/22 for a tube exchange to a council tip but the tract was too tight/immature. A 16 Fr pigtail drain was placed and he returns today for another attempt at council tip placement.   Past Medical History:  Diagnosis Date   Arthritis    Atrial fibrillation (HCC)    controlled on coumadin   GERD (gastroesophageal reflux disease)    Glaucoma    Gout    Hyperlipidemia    valvular heart disease    Warfarin therapy started 07/29/2014    Past Surgical History:  Procedure Laterality Date   COLONOSCOPY  2005 ?   GLAUCOMA SURGERY     IR CATHETER TUBE CHANGE  06/30/2022   IR CATHETER TUBE CHANGE  11/17/2022   IR US GUIDE BX ASP/DRAIN  10/17/2022    Allergies: Ace inhibitors and Penicillins  Medications: Prior to Admission medications   Medication Sig Start Date End Date Taking? Authorizing Provider  acetaminophen (TYLENOL) 500 MG tablet Take 500 mg by mouth every 6 (six) hours as needed for mild pain or moderate pain.    Smiley Houseman,  NP  allopurinol (ZYLOPRIM) 100 MG tablet Take 100 mg by mouth daily. 11/08/21   [provider]  bisacodyl (DULCOLAX) 5 MG EC tablet Take 2 tablets (10 mg total) by mouth at bedtime as needed for moderate constipation. 08/10/22   Gillis Santa, MD  Cholecalciferol 50 MCG (2000 UT) TABS Take 2,000 Units by mouth daily.    Smiley Houseman, NP  Cranberry 450 MG TABS Take 450 mg by mouth daily.    Smiley Houseman, NP  dorzolamide-timolol (COSOPT) 2-0.5 % ophthalmic solution Place 1 drop into both eyes 2 (two) times daily.    [provider]  finasteride (PROSCAR) 5 MG tablet Take 1 tablet (5 mg total) by mouth daily. 07/07/17   Duanne Limerick, MD  hyoscyamine (ANASPAZ) 0.125 MG TBDP disintergrating tablet Place 0.125 mg under the tongue every 4 (four) hours as needed.    [provider]  latanoprost (XALATAN) 0.005 % ophthalmic solution Place 1 drop into both eyes at bedtime. Dr Beverely Pace      LORazepam (ATIVAN) 0.5 MG tablet Take 0.5 mg by mouth. At bedtime or every 4 hours as needed for anxiety /agitation/ sleep/SOB/ N/V , / restlessness or control 10/05/22   [provider]  melatonin 3 MG TABS tablet Take 5 mg by mouth at bedtime as needed.    [provider]  metoprolol tartrate (LOPRESSOR) 25 MG tablet Take 0.5 tablets (12.5 mg total) by mouth 2 (two) times daily. 08/10/22  11/08/22  Gillis Santa, MD  Morphine Sulfate (MORPHINE CONCENTRATE) 10 mg / 0.5 ml concentrated solution Take 10 mg by mouth every 2 (two) hours as needed for severe pain. For shortness of breath or pain control    [provider]  mupirocin ointment (BACTROBAN) 2 % Apply 1 Application topically 2 (two) times daily.    [provider]  ondansetron (ZOFRAN) 8 MG tablet Take 8 mg by mouth every 8 (eight) hours as needed. 08/17/22   [provider]  phenazopyridine (PYRIDIUM) 95 MG tablet Take 95 mg by mouth 3 (three) times daily as needed (urinary spasms).    Smiley Houseman, NP  polyethylene glycol (MIRALAX / GLYCOLAX) 17 g packet Take 17 g by mouth 2 (two) times daily. 08/10/22   Gillis Santa, MD  risperiDONE (RISPERDAL) 0.5 MG tablet Take 0.5 mg by mouth at bedtime. 09/18/21   [provider]  sulfamethoxazole-trimethoprim (BACTRIM) 400-80 MG tablet Take 1 tablet by mouth daily. 09/14/22   [provider]  tamsulosin (FLOMAX) 0.4 MG CAPS capsule Take 0.4 mg by mouth daily. 06/11/22   [provider]  warfarin (COUMADIN) 5 MG tablet Take 5 mg by mouth daily.    Smiley Houseman, NP     Family History  Problem Relation Age of Onset   Cancer Maternal Uncle    Alzheimer's disease Mother    Healthy Father    Healthy Sister    Healthy Sister    Healthy Sister    Heart disease Brother     Social History   Socioeconomic History   Marital status: Married    Spouse name: Research scientist (physical sciences)   Number of children: 2   Years of education: Not on file   Highest education level: 10th grade  Occupational History   Occupation: Retired  Tobacco Use   Smoking status: Former    Types: Cigars    Quit date: 1970    Years since quitting: 54.8   Smokeless tobacco: Never   Tobacco comments:    smoking cessation materials not required  Vaping Use   Vaping status: Never Used  Substance and Sexual Activity   Alcohol use: Not Currently    Alcohol/week: 0.0 standard drinks of alcohol   Drug use: No   Sexual activity: Not Currently  Other Topics Concern   Not on file  Social History Narrative   Not on file   Social Determinants of Health   Financial Resource Strain: Low Risk  (06/15/2021)   Received from [facility]   Overall Financial Resource Strain (CARDIA)    Difficulty of Paying Living Expenses: Not hard at all  Food Insecurity: No Food Insecurity (08/04/2022)   Hunger Vital Sign    Worried About Running Out of Food in the Last Year: Never true    Ran Out of Food in the Last Year: Never true  Transportation Needs: No  Transportation Needs (08/04/2022)   PRAPARE - Administrator, Civil Service (Medical): No    Lack of Transportation (Non-Medical): No  Physical Activity: Inactive (07/12/2017)   Exercise Vital Sign    Days of Exercise per Week: 0 days    Minutes of Exercise per Session: 0 min  Stress: No Stress Concern Present (07/12/2017)   [employer] of Occupational Health - Occupational Stress Questionnaire    Feeling of Stress : Not at all  Social Connections: Unknown (07/12/2017)   Social Connection and Isolation Panel [NHANES]    Frequency  of Communication with Friends and Family: Patient declined    Frequency of Social Gatherings with Friends and Family: Patient declined    Attends Religious Services: Patient declined    Database administrator or Organizations: Patient declined    Attends Banker Meetings: Patient declined    Marital Status: Married    Review of Systems: A 12 point ROS discussed and pertinent positives are indicated in the HPI above.  All other systems are negative.  Review of Systems  Unable to perform ROS: Other  Patient asleep. Patient's daughter states he sleeps all the time.   Vital Signs: BP (!) 166/91 (BP Location: Right Arm)   Pulse (!) 112   Temp 98.4 F (36.9 C) (Oral)   Resp 13   SpO2 98%   Physical Exam Constitutional:      General: He is not in acute distress. HENT:     Mouth/Throat:     Mouth: Mucous membranes are dry.     Pharynx: Oropharynx is clear.  Cardiovascular:     Rate and Rhythm: Rhythm irregular.  Pulmonary:     Effort: Pulmonary effort is normal.     Breath sounds: Normal breath sounds.  Abdominal:     Palpations: Abdomen is soft.     Tenderness: There is no abdominal tenderness.  Genitourinary:    Comments: Suprapubic drain. Dressing clean/dry. Urine in gravity bag is amber-colored.  Musculoskeletal:     Right lower leg: No edema.     Left lower leg: No edema.  Skin:    General: Skin is warm and dry.   Neurological:     Comments: Unable to assess. Patient asleep.      Imaging: No results found.  Labs:  CBC: Recent Labs    08/09/22 0437 10/06/22 1826 10/17/22 0929 01/10/23 0858  WBC 6.4 7.8 6.6 11.2*  HGB 11.6* 12.3* 11.9* 11.8*  HCT 35.0* 35.9* 34.6* 36.1*  PLT 238 276 258 430*    COAGS: Recent Labs    08/03/22 1307 08/04/22 0521 08/08/22 0457 08/09/22 0437 08/10/22 0350 10/17/22 0929  INR 2.0*   < > 1.6* 1.7* 1.7* 1.2  APTT 37*  --   --   --   --   --    < > = values in this interval not displayed.    BMP: Recent Labs    08/08/22 0457 08/09/22 0437 08/10/22 0350 10/06/22 1826  NA 137 139 140 137  K 3.9 3.8 4.4 4.3  CL 106 104 107 105  CO2 26 25 25 25   GLUCOSE 93 118* 107* 127*  BUN 23 21 30* 21  CALCIUM 8.2* 8.7* 8.6* 8.5*  CREATININE 0.83 0.90 0.98 1.18  GFRNONAA >60 >60 >60 57*    LIVER FUNCTION TESTS: Recent Labs    06/11/22 1950 06/12/22 0616 08/03/22 1307  BILITOT 0.5 0.5 0.9  AST 26 23 22   ALT 14 12 15   ALKPHOS 104 91 86  PROT 7.2 6.7 7.7  ALBUMIN 3.3* 3.1* 3.9    TUMOR MARKERS: No results for input(s): "AFPTM", "CEA", "CA199", "CHROMGRNA" in the last 8760 hours.  Assessment and Plan:  Urinary retention: Dorita Fray. Mcclarin, 89 year old male, presents today to the [facility] Interventional Radiology department for an image-guided suprapubic catheter exchange. Procedure was discussed with the patient's daughter and she signed the written consent.   Risks and benefits discussed with the patient including bleeding, infection, damage to adjacent structures, bowel perforation/fistula connection, and sepsis.  All of the  patient's questions were answered, patient is agreeable to proceed. He has been NPO.   Consent signed and in chart.  Thank you for this interesting consult.  I greatly enjoyed meeting JALLEN YOUNGERS and look forward to participating in their care.  A copy of this report was sent to the requesting  provider on this date.  Electronically Signed: Alwyn Ren, AGACNP-BC [phone] 01/10/2023, 9:32 AM   I spent a total of  30 Minutes   in face to face in clinical consultation, greater than 50% of which was counseling/coordinating care for suprapubic catheter exchange/upsize.

## 2023-02-07 NOTE — Progress Notes (Unsigned)
Suprapubic Cath Change  Patient is present today for a suprapubic catheter change due to urinary retention.  8 ml of water was drained from the balloon, a 16 FR council tip foley cath was removed from the tract with out difficulty.  Site was cleaned and prepped in a sterile fashion with betadine.  A 16 FR foley cath was replaced into the tract no complications were noted. Urine return was noted, 10 ml of sterile water was inflated into the balloon and a bag was attached for drainage.  Patient tolerated well. A night bag was given to patient and proper instruction was given on how to switch bags.    Performed by: Michiel Cowboy, PA-C   Follow up: No follow-ups on file.

## 2023-02-13 ENCOUNTER — Ambulatory Visit: Payer: Medicare Other | Admitting: Urology

## 2023-02-20 ENCOUNTER — Emergency Department: Payer: Medicare Other

## 2023-02-20 ENCOUNTER — Other Ambulatory Visit: Payer: Self-pay

## 2023-02-20 ENCOUNTER — Emergency Department
Admission: EM | Admit: 2023-02-20 | Discharge: 2023-02-20 | Disposition: A | Discharge status: Home / Self Care | Payer: Medicare Other | Attending: Emergency Medicine | Admitting: Emergency Medicine

## 2023-02-20 DIAGNOSIS — B9689 Other specified bacterial agents as the cause of diseases classified elsewhere: Secondary | ICD-10-CM | POA: Diagnosis not present

## 2023-02-20 DIAGNOSIS — R109 Unspecified abdominal pain: Secondary | ICD-10-CM | POA: Diagnosis present

## 2023-02-20 DIAGNOSIS — R339 Retention of urine, unspecified: Secondary | ICD-10-CM | POA: Insufficient documentation

## 2023-02-20 DIAGNOSIS — F0390 Unspecified dementia without behavioral disturbance: Secondary | ICD-10-CM | POA: Diagnosis not present

## 2023-02-20 DIAGNOSIS — N390 Urinary tract infection, site not specified: Secondary | ICD-10-CM

## 2023-02-20 LAB — URINALYSIS, ROUTINE W REFLEX MICROSCOPIC
Glucose, UA: NEGATIVE mg/dL
Hgb urine dipstick: NEGATIVE
Ketones, ur: NEGATIVE mg/dL
Nitrite: NEGATIVE
Protein, ur: >=300 mg/dL — AB
Specific Gravity, Urine: 1.024 (ref 1.005–1.030)
pH: 8 (ref 5.0–8.0)

## 2023-02-20 LAB — BASIC METABOLIC PANEL
Anion gap: 10 (ref 5–15)
BUN: 23 mg/dL (ref 8–23)
CO2: 20 mmol/L — ABNORMAL LOW (ref 22–32)
Calcium: 7.9 mg/dL — ABNORMAL LOW (ref 8.9–10.3)
Chloride: 110 mmol/L (ref 98–111)
Creatinine, Ser: 1.08 mg/dL (ref 0.61–1.24)
GFR, Estimated: >60 mL/min (ref >60)
Glucose, Bld: 107 mg/dL — ABNORMAL HIGH (ref 70–99)
Potassium: 3.7 mmol/L (ref 3.5–5.1)
Sodium: 140 mmol/L (ref 135–145)

## 2023-02-20 LAB — CBC
HCT: 32.2 % — ABNORMAL LOW (ref 39.0–52.0)
Hemoglobin: 10.5 g/dL — ABNORMAL LOW (ref 13.0–17.0)
MCH: 31.3 pg (ref 26.0–34.0)
MCHC: 32.6 g/dL (ref 30.0–36.0)
MCV: 96.1 fL (ref 80.0–100.0)
Platelets: 365 10*3/uL (ref 150–400)
RBC: 3.35 MIL/uL — ABNORMAL LOW (ref 4.22–5.81)
RDW: 13.1 % (ref 11.5–15.5)
WBC: 8.5 10*3/uL (ref 4.0–10.5)
nRBC: 0 % (ref 0.0–0.2)

## 2023-02-20 MED ORDER — FOSFOMYCIN TROMETHAMINE 3 G PO PACK
3.0000 g | PACK | Freq: Once | ORAL | Status: AC
  Administered 2023-02-20: 3 g via ORAL
  Filled 2023-02-20: qty 3

## 2023-02-20 NOTE — ED Notes (Addendum)
Foley was unable to be flushed, MD notified, Bladder scan showed , new suprapubic placed by provider @this  time. Wound noted on L forearm, dressed at this time

## 2023-02-20 NOTE — ED Notes (Signed)
Re-updated [facility] nurse about Pts foley draining dark red blood, that CT scan was negative. Nurse verbalized understanding, ED secretary called and transport requested again

## 2023-02-20 NOTE — ED Provider Notes (Addendum)
[facility] Provider Note    Event Date/Time   First MD Initiated Contact with Patient 02/20/23 1541     (approximate)  History   Chief Complaint: Urinary Retention  HPI  Erik Larson is a 89 y.o. male with a past medical history of arthritis, dementia, atrial fibrillation, gastric reflux, hyperlipidemia, presents for evaluation of his suprapubic catheter.  According to report patient is coming from [facility] where they have noticed little urine production from the catheter over the past 4 days and they state what has come out has been more dark in color.  They state a history of urinary tract infections and was concerned patient could have points of the symptoms to the emergency department.  Patient is awake alert, no distress and nontoxic in appearance but is unable to contribute to his history.  Physical Exam   Triage Vital Signs: ED Triage Vitals  Encounter Vitals Group     BP 02/20/23 1433 107/73     Systolic BP Percentile --      Diastolic BP Percentile --      Pulse Rate 02/20/23 1433 93     Resp 02/20/23 1433 18     Temp 02/20/23 1433 98 F (36.7 C)     Temp src --      SpO2 02/20/23 1433 100 %     Weight 02/20/23 1417 130 lb (59 kg)     Height 02/20/23 1417 5\' 5"  (1.651 m)     Head Circumference --      Peak Flow --      Pain Score 02/20/23 1417 0     Pain Loc --      Pain Education --      Exclude from Growth Chart --     Most recent vital signs: Vitals:   02/20/23 1433  BP: 107/73  Pulse: 93  Resp: 18  Temp: 98 F (36.7 C)  SpO2: 100%    General: Awake, no distress.  CV:  Good peripheral perfusion.  Regular rate and rhythm  Resp:  Normal effort.  Equal breath sounds bilaterally.  Abd:  No distention.  Soft, nontender.  No rebound or guarding.  ED Results / Procedures / Treatments    MEDICATIONS ORDERED IN ED: Medications - No data to display  I have reviewed and interpreted CT images.  Catheter appears to be in the  appropriate position per my review.   IMPRESSION / MDM / ASSESSMENT AND PLAN / ED COURSE  I reviewed the triage vital signs and the nursing notes.  Patient's presentation is most consistent with acute presentation with potential threat to life or bodily function.  Patient presents to the emergency department for decreased output from his suprapubic catheter.  Catheter flushed and is draining well.  No significant retention we will obtain a bladder scan as a precaution.  Patient's lab work overall reassuring normal CBC reassuring chemistry including normal renal function.  Patient's urinalysis does show 11-20 red cells 21-50 white cells and rare bacteria.  We will send a urine culture as a precaution dose a one-time dose of fosfomycin.  As long as the bladder scan shows no significant retention we will discharge back to his nursing facility.  After flushing again patient had greater than 100 cc of urine in the bladder with minimal drainage.  I removed the Foley catheter and placed a new catheter with good drainage.  Patient will be discharged home.  On recheck patient has hematuria in the  bag.  He is in no discomfort.  Urine is draining well but is now bloody.  Will obtain a CT renal scan to ensure proper suprapubic placement.  As long as the suprapubic is in the appropriate place anticipate continued discharge home.  Suspect irritation from placement.  FINAL CLINICAL IMPRESSION(S) / ED DIAGNOSES   Urinary tract infection   Note:  This document was prepared using Dragon voice recognition software and may include unintentional dictation errors.   Minna Antis, MD 02/20/23 1712    Minna Antis, MD 02/20/23 1818    Minna Antis, MD 02/20/23 2026

## 2023-02-20 NOTE — ED Notes (Signed)
[facility] called for transport to [facility] facility

## 2023-02-20 NOTE — ED Notes (Signed)
called to [facility] per RN Megan to cancel pt transport facility/[facility]/operator:Ashley.

## 2023-02-20 NOTE — ED Notes (Signed)
100-21mls of dark red blood is noted in foley bag, MD notified at this time. No drainage noted at site of suprapubic, Pt is not complaining of any pain.

## 2023-02-20 NOTE — Discharge Instructions (Signed)
You have been seen in the emergency department for urinary concern.  Your urinalysis shows a mild infection this has been treated with a one-time dose of antibiotics.  No further antibiotics are needed.  Urine culture has been sent as a precaution.  Please follow-up with your doctor for recheck/reevaluation within the next few days.  Return to the emergency department for any symptom concerning to yourself or staff members.

## 2023-02-20 NOTE — ED Notes (Signed)
[facility] cancelled per [facility] RN

## 2023-02-20 NOTE — ED Notes (Signed)
CALLED [facility], SPOKE TO REP. PAM REP. PAM STATED THE PT. IS NEXT ON THE LIST TO BE PICKED UP.

## 2023-02-20 NOTE — ED Triage Notes (Addendum)
Pt comes via EMS from [facility] with c/o Urinary retention for 4 days. Pt has suprapubic cath with little output and is dark in color.  Pt has hx of frequent UTI. Pt at baseline. Pt is DNR.  VSS  Pt is legally blind

## 2023-02-21 LAB — URINE CULTURE: Culture: NO GROWTH

## 2023-03-15 DEATH — deceased
